# Patient Record
Sex: Male | Born: 1950
Health system: Southern US, Community
[De-identification: ages and names within clinical notes are randomized; demographics above are authoritative.]

## PROBLEM LIST (undated history)

## (undated) DIAGNOSIS — Z9889 Other specified postprocedural states: Secondary | ICD-10-CM

## (undated) DIAGNOSIS — M1990 Unspecified osteoarthritis, unspecified site: Secondary | ICD-10-CM

## (undated) DIAGNOSIS — K219 Gastro-esophageal reflux disease without esophagitis: Secondary | ICD-10-CM

## (undated) DIAGNOSIS — I493 Ventricular premature depolarization: Secondary | ICD-10-CM

## (undated) DIAGNOSIS — I10 Essential (primary) hypertension: Secondary | ICD-10-CM

## (undated) DIAGNOSIS — G629 Polyneuropathy, unspecified: Secondary | ICD-10-CM

## (undated) DIAGNOSIS — T7840XA Allergy, unspecified, initial encounter: Secondary | ICD-10-CM

## (undated) DIAGNOSIS — E785 Hyperlipidemia, unspecified: Secondary | ICD-10-CM

## (undated) DIAGNOSIS — R112 Nausea with vomiting, unspecified: Secondary | ICD-10-CM

## (undated) HISTORY — PX: SKIN BIOPSY: SHX1

## (undated) HISTORY — PX: KNEE SURGERY: SHX244

## (undated) HISTORY — DX: Hyperlipidemia, unspecified: E78.5

## (undated) HISTORY — DX: Gastro-esophageal reflux disease without esophagitis: K21.9

## (undated) HISTORY — DX: Polyneuropathy, unspecified: G62.9

## (undated) HISTORY — DX: Allergy, unspecified, initial encounter: T78.40XA

## (undated) HISTORY — PX: HAND SURGERY: SHX662

## (undated) HISTORY — PX: COLONOSCOPY: SHX174

## (undated) HISTORY — DX: Unspecified osteoarthritis, unspecified site: M19.90

## (undated) HISTORY — PX: HERNIA REPAIR: SHX51

---

## 2000-11-04 ENCOUNTER — Ambulatory Visit (HOSPITAL_COMMUNITY): Admission: RE | Admit: 2000-11-04 | Discharge: 2000-11-04 | Payer: Self-pay | Admitting: Gastroenterology

## 2002-09-13 ENCOUNTER — Other Ambulatory Visit: Admission: RE | Admit: 2002-09-13 | Discharge: 2002-09-13 | Payer: Self-pay | Admitting: Dermatology

## 2009-10-16 ENCOUNTER — Encounter (INDEPENDENT_AMBULATORY_CARE_PROVIDER_SITE_OTHER): Payer: Self-pay | Admitting: *Deleted

## 2009-11-11 ENCOUNTER — Ambulatory Visit: Payer: Self-pay | Admitting: Gastroenterology

## 2009-11-11 DIAGNOSIS — K625 Hemorrhage of anus and rectum: Secondary | ICD-10-CM | POA: Insufficient documentation

## 2009-11-11 DIAGNOSIS — R12 Heartburn: Secondary | ICD-10-CM

## 2009-11-27 ENCOUNTER — Ambulatory Visit: Payer: Self-pay | Admitting: Gastroenterology

## 2010-05-21 ENCOUNTER — Encounter: Admission: RE | Admit: 2010-05-21 | Discharge: 2010-05-21 | Payer: Self-pay | Admitting: Family Medicine

## 2011-01-11 ENCOUNTER — Encounter: Payer: Self-pay | Admitting: Family Medicine

## 2011-12-04 ENCOUNTER — Other Ambulatory Visit: Payer: Self-pay | Admitting: Family Medicine

## 2011-12-04 ENCOUNTER — Ambulatory Visit
Admission: RE | Admit: 2011-12-04 | Discharge: 2011-12-04 | Disposition: A | Discharge status: Home / Self Care | Payer: BC Managed Care – PPO | Source: Ambulatory Visit | Attending: Family Medicine | Admitting: Family Medicine

## 2011-12-04 ENCOUNTER — Other Ambulatory Visit: Payer: Self-pay

## 2011-12-04 DIAGNOSIS — M79669 Pain in unspecified lower leg: Secondary | ICD-10-CM

## 2011-12-04 DIAGNOSIS — M25562 Pain in left knee: Secondary | ICD-10-CM

## 2012-02-08 ENCOUNTER — Other Ambulatory Visit: Payer: Self-pay | Admitting: Family Medicine

## 2012-02-08 DIAGNOSIS — M5410 Radiculopathy, site unspecified: Secondary | ICD-10-CM

## 2012-02-12 ENCOUNTER — Ambulatory Visit
Admission: RE | Admit: 2012-02-12 | Discharge: 2012-02-12 | Disposition: A | Discharge status: Home / Self Care | Payer: BC Managed Care – PPO | Source: Ambulatory Visit | Attending: Family Medicine | Admitting: Family Medicine

## 2012-02-12 DIAGNOSIS — M5410 Radiculopathy, site unspecified: Secondary | ICD-10-CM

## 2012-07-13 ENCOUNTER — Other Ambulatory Visit: Payer: Self-pay | Admitting: Family Medicine

## 2012-07-13 DIAGNOSIS — D1803 Hemangioma of intra-abdominal structures: Secondary | ICD-10-CM

## 2012-07-13 DIAGNOSIS — N5089 Other specified disorders of the male genital organs: Secondary | ICD-10-CM

## 2012-07-20 ENCOUNTER — Other Ambulatory Visit: Payer: Self-pay

## 2012-07-20 ENCOUNTER — Ambulatory Visit
Admission: RE | Admit: 2012-07-20 | Discharge: 2012-07-20 | Disposition: A | Discharge status: Home / Self Care | Payer: BC Managed Care – PPO | Source: Ambulatory Visit | Attending: Family Medicine | Admitting: Family Medicine

## 2012-07-20 DIAGNOSIS — N5089 Other specified disorders of the male genital organs: Secondary | ICD-10-CM

## 2012-07-20 DIAGNOSIS — D1803 Hemangioma of intra-abdominal structures: Secondary | ICD-10-CM

## 2013-07-03 ENCOUNTER — Telehealth: Payer: Self-pay | Admitting: Family Medicine

## 2013-07-04 NOTE — Telephone Encounter (Signed)
Patient is having difficulty hearing.  He went to Urgent Care last night and was told to take a decongestant which is what I was going to suggest. Dr. Christell Constant is going to contact the patient today as well.

## 2013-07-05 ENCOUNTER — Telehealth: Payer: Self-pay | Admitting: Family Medicine

## 2013-11-08 DIAGNOSIS — Z23 Encounter for immunization: Secondary | ICD-10-CM

## 2013-11-20 ENCOUNTER — Other Ambulatory Visit (INDEPENDENT_AMBULATORY_CARE_PROVIDER_SITE_OTHER): Payer: BC Managed Care – PPO | Admitting: *Deleted

## 2013-11-20 DIAGNOSIS — Z23 Encounter for immunization: Secondary | ICD-10-CM

## 2014-02-01 ENCOUNTER — Encounter: Payer: Self-pay | Admitting: Family Medicine

## 2014-02-01 ENCOUNTER — Ambulatory Visit (INDEPENDENT_AMBULATORY_CARE_PROVIDER_SITE_OTHER): Payer: BC Managed Care – PPO | Admitting: Family Medicine

## 2014-02-01 ENCOUNTER — Encounter (INDEPENDENT_AMBULATORY_CARE_PROVIDER_SITE_OTHER): Payer: Self-pay

## 2014-02-01 VITALS — BP 120/70 | HR 58 | Temp 97.1°F | Ht 74.0 in | Wt 234.0 lb

## 2014-02-01 DIAGNOSIS — N4340 Spermatocele of epididymis, unspecified: Secondary | ICD-10-CM | POA: Insufficient documentation

## 2014-02-01 DIAGNOSIS — K8020 Calculus of gallbladder without cholecystitis without obstruction: Secondary | ICD-10-CM | POA: Insufficient documentation

## 2014-02-01 DIAGNOSIS — M4802 Spinal stenosis, cervical region: Secondary | ICD-10-CM | POA: Insufficient documentation

## 2014-02-01 DIAGNOSIS — R12 Heartburn: Secondary | ICD-10-CM

## 2014-02-01 DIAGNOSIS — N433 Hydrocele, unspecified: Secondary | ICD-10-CM | POA: Insufficient documentation

## 2014-02-01 DIAGNOSIS — M5090 Cervical disc disorder, unspecified, unspecified cervical region: Secondary | ICD-10-CM | POA: Insufficient documentation

## 2014-02-01 DIAGNOSIS — E785 Hyperlipidemia, unspecified: Secondary | ICD-10-CM | POA: Insufficient documentation

## 2014-02-01 DIAGNOSIS — B351 Tinea unguium: Secondary | ICD-10-CM

## 2014-02-01 DIAGNOSIS — K429 Umbilical hernia without obstruction or gangrene: Secondary | ICD-10-CM | POA: Insufficient documentation

## 2014-02-01 DIAGNOSIS — Z0000 Encounter for general adult medical examination without abnormal findings: Secondary | ICD-10-CM

## 2014-02-01 LAB — POCT URINALYSIS DIPSTICK
Bilirubin, UA: NEGATIVE
Glucose, UA: NEGATIVE
KETONES UA: NEGATIVE
Leukocytes, UA: NEGATIVE
Nitrite, UA: NEGATIVE
PH UA: 7
Protein, UA: NEGATIVE
RBC UA: NEGATIVE
SPEC GRAV UA: 1.01
Urobilinogen, UA: NEGATIVE

## 2014-02-01 LAB — POCT UA - MICROSCOPIC ONLY
BACTERIA, U MICROSCOPIC: NEGATIVE
CASTS, UR, LPF, POC: NEGATIVE
Crystals, Ur, HPF, POC: NEGATIVE
Epithelial cells, urine per micros: NEGATIVE
MUCUS UA: NEGATIVE
RBC, urine, microscopic: NEGATIVE
WBC, Ur, HPF, POC: NEGATIVE
Yeast, UA: NEGATIVE

## 2014-02-01 LAB — POCT CBC
Granulocyte percent: 64.4 %G (ref 37–80)
HCT, POC: 43.2 % — AB (ref 43.5–53.7)
Hemoglobin: 14.4 g/dL (ref 14.1–18.1)
LYMPH, POC: 1.1 (ref 0.6–3.4)
MCH: 30.1 pg (ref 27–31.2)
MCHC: 33.2 g/dL (ref 31.8–35.4)
MCV: 90.7 fL (ref 80–97)
MPV: 6.8 fL (ref 0–99.8)
PLATELET COUNT, POC: 177 10*3/uL (ref 142–424)
POC GRANULOCYTE: 2.4 (ref 2–6.9)
POC LYMPH PERCENT: 30.7 %L (ref 10–50)
RBC: 4.8 M/uL (ref 4.69–6.13)
RDW, POC: 12.1 %
WBC: 3.7 10*3/uL — AB (ref 4.6–10.2)

## 2014-02-01 NOTE — Addendum Note (Signed)
Addended by: Zannie Cove on: 02/01/2014 05:12 PM   Modules accepted: Orders

## 2014-02-01 NOTE — Progress Notes (Signed)
Subjective:    Patient ID: Marcus Jensen, male    DOB: 12-03-1951, 63 y.o.   MRN: [id number]  HPI Pt here for follow up and management of chronic medical problems. The patient is here for an annual exam. He FOBT to return. He will have lab work done. He understands that he is to check with his insurance regarding the Prevnar vaccine and a Zostavax. The patient is due for an eye exam and a stress test. His chest x-ray is not needed at this time.        Patient Active Problem List   Diagnosis Date Noted  . Hyperlipemia 02/01/2014  . HEMORRHAGE OF RECTUM AND ANUS 11/11/2009  . HEARTBURN 11/11/2009   Outpatient Encounter Prescriptions as of 02/01/2014  Medication Sig  . b complex vitamins capsule Take 1 capsule by mouth daily.  . cholecalciferol (VITAMIN D) 1000 UNITS tablet Take 2,000 Units by mouth daily.  Marland Kitchen gabapentin (NEURONTIN) 400 MG capsule Take 800 mg by mouth 2 (two) times daily.  Marland Kitchen glucosamine-chondroitin 500-400 MG tablet Take 1 tablet by mouth 3 (three) times daily.  . Omega-3 Fatty Acids (FISH OIL) 1000 MG CAPS Take 2 capsules by mouth daily.    Review of Systems  Constitutional: Negative.   HENT: Negative.   Eyes: Negative.   Respiratory: Negative.   Cardiovascular: Negative.   Gastrointestinal: Negative.   Endocrine: Negative.   Genitourinary: Negative.   Musculoskeletal: Positive for arthralgias (shoulder pains and carpel tunnel issues), myalgias (weakness in right arm) and neck pain (tolerable).  Skin: Negative.   Allergic/Immunologic: Negative.   Neurological: Negative.   Hematological: Negative.   Psychiatric/Behavioral: Negative.        Objective:   Physical Exam  Nursing note and vitals reviewed. Constitutional: He is oriented to person, place, and time. He appears well-developed and well-nourished. No distress.  HENT:  Head: Normocephalic and atraumatic.  Right Ear: External ear normal.  Left Ear: External ear normal.  Nose: Nose normal.    Mouth/Throat: Oropharynx is clear and moist. No oropharyngeal exudate.  Eyes: Conjunctivae and EOM are normal. Pupils are equal, round, and reactive to light. Right eye exhibits no discharge. Left eye exhibits no discharge. No scleral icterus.  Neck: Normal range of motion. Neck supple. No tracheal deviation present. No thyromegaly present.  No carotid bruits  Cardiovascular: Normal rate, regular rhythm, normal heart sounds and intact distal pulses.  Exam reveals no gallop and no friction rub.   No murmur heard. At 72 per minute  Pulmonary/Chest: Effort normal and breath sounds normal. No respiratory distress. He has no wheezes. He has no rales. He exhibits no tenderness.  No chest wall abnormalities or axillary adenopathy  Abdominal: Soft. Bowel sounds are normal. He exhibits no mass. There is no tenderness. There is no rebound and no guarding.  There is an umbilical hernia which was not tender.  Genitourinary: Rectum normal, prostate normal and penis normal.  The patient has a slightly enlarged prostate right greater than left he has a left spermatocele and a right hydrocele, there were no rectal masses. There were no inguinal hernias palpated. The external genitalia were otherwise normal. He has a history of having had scrotal ultrasound. And the above findings were noted from that.  Musculoskeletal: Normal range of motion. He exhibits no edema and no tenderness.  Lymphadenopathy:    He has no cervical adenopathy.  Neurological: He is alert and oriented to person, place, and time. He has normal reflexes. No cranial nerve  deficit.  Skin: Skin is warm and dry. No rash noted. No erythema. No pallor.  Tinea unguium present on toenails  Psychiatric: He has a normal mood and affect. His behavior is normal. Judgment and thought content normal.   BP 120/70  Pulse 58  Temp(Src) 97.1 F (36.2 C) (Oral)  Ht _0  (1.88 m)  Wt 234 lb (106.142 kg)  BMI 30.03 kg/m2        Assessment & Plan:    1. Hyperlipemia - NMR, lipoprofile  2. Heartburn  3. Annual physical exam - POCT CBC - BMP8+EGFR - Hepatic function panel - PSA, total and free - POCT UA - Microscopic Only - POCT urinalysis dipstick - Vit D  25 hydroxy (rtn osteoporosis monitoring) - Thyroid Panel With TSH  4. Cholelithiasis  5. Cervical disc disease  6. Spinal stenosis of cervical region  7. Spermatocele  8. Hydrocele, right  9. Umbilical hernia  10. Tinea unguium Patient Instructions  Continue current medications. Continue good therapeutic lifestyle changes which include good diet and exercise. Fall precautions discussed with patient. Schedule your flu vaccine if you haven't had it yet If you are over 98 years old - you may need Prevnar 41 or the adult Pneumonia vaccine. Please return the FOBT Please get an eye exam We will call you with the results of the lab work once this lab work is available. We will schedule you for a stress test with [facility] cardiology Please check with your insurance regarding the Prevnar and Zostavax    Arrie Senate MD

## 2014-02-01 NOTE — Patient Instructions (Addendum)
Continue current medications. Continue good therapeutic lifestyle changes which include good diet and exercise. Fall precautions discussed with patient. Schedule your flu vaccine if you haven't had it yet If you are over 61 years old - you may need Prevnar 85 or the adult Pneumonia vaccine. Please return the FOBT Please get an eye exam We will call you with the results of the lab work once this lab work is available. We will schedule you for a stress test with [facility] cardiology Please check with your insurance regarding the Prevnar and Zostavax

## 2014-02-02 LAB — NMR, LIPOPROFILE
Cholesterol: 238 mg/dL — ABNORMAL HIGH (ref <200)
HDL Cholesterol by NMR: 55 mg/dL (ref >=40)
HDL PARTICLE NUMBER: 29.6 umol/L — AB (ref >=30.5)
LDL Particle Number: 1820 nmol/L — ABNORMAL HIGH (ref <1000)
LDL Size: 21.1 nm (ref >20.5)
LDLC SERPL CALC-MCNC: 164 mg/dL — AB (ref <100)
LP-IR Score: 35 (ref <=45)
Small LDL Particle Number: 802 nmol/L — ABNORMAL HIGH (ref <=527)
TRIGLYCERIDES BY NMR: 97 mg/dL (ref <150)

## 2014-02-02 LAB — THYROID PANEL WITH TSH
Free Thyroxine Index: 2.3 (ref 1.2–4.9)
T3 UPTAKE RATIO: 28 % (ref 24–39)
T4 TOTAL: 8.1 ug/dL (ref 4.5–12.0)
TSH: 0.707 u[IU]/mL (ref 0.450–4.500)

## 2014-02-02 LAB — HEPATIC FUNCTION PANEL
ALT: 21 IU/L (ref 0–44)
AST: 25 IU/L (ref 0–40)
Albumin: 4.9 g/dL — ABNORMAL HIGH (ref 3.6–4.8)
Alkaline Phosphatase: 69 IU/L (ref 39–117)
BILIRUBIN DIRECT: 0.18 mg/dL (ref 0.00–0.40)
BILIRUBIN TOTAL: 0.7 mg/dL (ref 0.0–1.2)
TOTAL PROTEIN: 7.1 g/dL (ref 6.0–8.5)

## 2014-02-02 LAB — BMP8+EGFR
BUN / CREAT RATIO: 17 (ref 10–22)
BUN: 18 mg/dL (ref 8–27)
CHLORIDE: 98 mmol/L (ref 97–108)
CO2: 28 mmol/L (ref 18–29)
Calcium: 9.5 mg/dL (ref 8.6–10.2)
Creatinine, Ser: 1.05 mg/dL (ref 0.76–1.27)
GFR calc Af Amer: 89 mL/min/{1.73_m2} (ref >59)
GFR, EST NON AFRICAN AMERICAN: 77 mL/min/{1.73_m2} (ref >59)
GLUCOSE: 88 mg/dL (ref 65–99)
POTASSIUM: 4.1 mmol/L (ref 3.5–5.2)
SODIUM: 140 mmol/L (ref 134–144)

## 2014-02-02 LAB — PSA, TOTAL AND FREE
PSA FREE PCT: 28.3 %
PSA FREE: 0.51 ng/mL
PSA: 1.8 ng/mL (ref 0.0–4.0)

## 2014-02-02 LAB — VITAMIN D 25 HYDROXY (VIT D DEFICIENCY, FRACTURES): VIT D 25 HYDROXY: 45.4 ng/mL (ref 30.0–100.0)

## 2014-02-08 ENCOUNTER — Other Ambulatory Visit: Payer: BC Managed Care – PPO

## 2014-02-08 DIAGNOSIS — Z1212 Encounter for screening for malignant neoplasm of rectum: Secondary | ICD-10-CM

## 2014-02-08 NOTE — Progress Notes (Signed)
Pt came in for labs only 

## 2014-02-09 LAB — FECAL OCCULT BLOOD, IMMUNOCHEMICAL: FECAL OCCULT BLD: NEGATIVE

## 2014-02-21 ENCOUNTER — Encounter: Payer: Self-pay | Admitting: *Deleted

## 2014-02-21 ENCOUNTER — Ambulatory Visit (INDEPENDENT_AMBULATORY_CARE_PROVIDER_SITE_OTHER): Payer: BC Managed Care – PPO | Admitting: Physician Assistant

## 2014-02-21 DIAGNOSIS — E785 Hyperlipidemia, unspecified: Secondary | ICD-10-CM

## 2014-02-21 DIAGNOSIS — Z0000 Encounter for general adult medical examination without abnormal findings: Secondary | ICD-10-CM

## 2014-02-21 NOTE — Progress Notes (Signed)
Exercise Treadmill Test  Pre-Exercise Testing Evaluation Rhythm: normal sinus  Rate: 68     Test  Exercise Tolerance Test Ordering MD: Loralie Champagne, MD  Interpreting [id number] Bonnell Public, PA-C  Unique Test No: 1  Treadmill:  1  Indication for ETT: annual physical exam, hyperlipidemia  Contraindication to ETT: No   Stress Modality: exercise - treadmill  Cardiac Imaging Performed: non   Protocol: standard Bruce - maximal  Max BP:  207/78  Max MPHR (bpm):  160 85% MPR (bpm):  136  MPHR obtained (bpm):  166 % MPHR obtained:  104  Reached 85% MPHR (min:sec):  7:17 Total Exercise Time (min-sec):  9:14  Workload in METS:  11.3 Borg Scale: 17  Reason ETT Terminated: fatigue    ST Segment Analysis At Rest: normal ST segments - no evidence of significant ST depression With Exercise: no evidence of significant ST depression  Other Information Arrhythmia:  Yes: occasional PAC Angina during ETT:  absent (0) Quality of ETT:  diagnostic  ETT Interpretation:  normal - no evidence of ischemia by ST analysis  Comments: Good exercise tolerance. No Chest pain or EKG changes.  Occasional PAC's and hypertensive response to exercise. Reviewed with Dr. Julianne Handler who concurs Normal GXT Recommendations: F/u Dr. Laurance Flatten.

## 2014-02-21 NOTE — Progress Notes (Signed)
Quick Note:  Copy of labs sent to patient ______ 

## 2014-03-05 ENCOUNTER — Encounter: Payer: Self-pay | Admitting: *Deleted

## 2014-05-11 ENCOUNTER — Other Ambulatory Visit: Payer: Self-pay | Admitting: *Deleted

## 2014-05-11 MED ORDER — ROSUVASTATIN CALCIUM 20 MG PO TABS: ORAL_TABLET | ORAL | Status: DC

## 2014-05-26 ENCOUNTER — Emergency Department (HOSPITAL_COMMUNITY)
Admission: EM | Admit: 2014-05-26 | Discharge: 2014-05-26 | Disposition: A | Discharge status: Home / Self Care | Payer: Worker's Compensation | Attending: Emergency Medicine | Admitting: Emergency Medicine

## 2014-05-26 ENCOUNTER — Encounter (HOSPITAL_COMMUNITY): Payer: Self-pay | Admitting: Emergency Medicine

## 2014-05-26 DIAGNOSIS — Z79899 Other long term (current) drug therapy: Secondary | ICD-10-CM | POA: Insufficient documentation

## 2014-05-26 DIAGNOSIS — Z87891 Personal history of nicotine dependence: Secondary | ICD-10-CM | POA: Insufficient documentation

## 2014-05-26 DIAGNOSIS — Z23 Encounter for immunization: Secondary | ICD-10-CM | POA: Insufficient documentation

## 2014-05-26 DIAGNOSIS — G609 Hereditary and idiopathic neuropathy, unspecified: Secondary | ICD-10-CM | POA: Insufficient documentation

## 2014-05-26 DIAGNOSIS — E785 Hyperlipidemia, unspecified: Secondary | ICD-10-CM | POA: Insufficient documentation

## 2014-05-26 MED ORDER — RABIES VACCINE, PCEC IM SUSR
1.0000 mL | Freq: Once | INTRAMUSCULAR | Status: AC
  Administered 2014-05-26: 1 mL via INTRAMUSCULAR
  Filled 2014-05-26: qty 1

## 2014-05-26 MED ORDER — RABIES IMMUNE GLOBULIN 150 UNIT/ML IM INJ
20.0000 [IU]/kg | INJECTION | Freq: Once | INTRAMUSCULAR | Status: AC
Start: 2014-05-26 — End: 2014-05-26
  Administered 2014-05-26: 2025 [IU] via INTRAMUSCULAR
  Filled 2014-05-26: qty 13.5

## 2014-05-26 NOTE — ED Provider Notes (Signed)
Medical screening examination/treatment/procedure(s) were performed by non-physician practitioner and as supervising physician I was immediately available for consultation/collaboration.  Richarda Blade, MD 05/26/14 [phone]

## 2014-05-26 NOTE — ED Provider Notes (Signed)
CSN: [id number]     Arrival date & time 05/26/14  2057 History   First MD Initiated Contact with Patient 05/26/14 2107     No chief complaint on file.    (Consider location/radiation/quality/duration/timing/severity/associated sxs/prior Treatment) The history is provided by the patient.   Marcus Jensen is a 63 y.o. male who presents to the ED for rabies vaccine after being exposed to a rabid cat yesterday. 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 He had to decapitate the the kitten and send the head for evaluation for possible rabies. This morning he got a call telling him the kitten had tested positive. The patient has not had his vaccine since 1970. He was wearing gloves but had been handling the cage and is not sure if when the kitten was hissing he may have gotten saliva on him.     Past Medical History  Diagnosis Date  . Hyperlipidemia   . Peripheral neuropathy    Past Surgical History  Procedure Laterality Date  . Knee surgery Bilateral   . Hand surgery Left    Family History  Problem Relation Age of Onset  . CVA Mother   . CVA Father   . Cancer Brother     liver   History  Substance Use Topics  . Smoking status: Former Smoker    Quit date: 02/01/1990  . Smokeless tobacco: Not on file  . Alcohol Use: No    Review of Systems Negative except as stated in HPI   Allergies  Review of patient's allergies indicates no known allergies.  Home Medications   Prior to Admission medications   Medication Sig Start Date End Date Taking? Authorizing Provider  b complex vitamins capsule Take 1 capsule by mouth daily.    Historical Provider, MD  cholecalciferol (VITAMIN D) 1000 UNITS tablet Take 2,000 Units by mouth daily.    Historical Provider, MD  gabapentin (NEURONTIN) 400 MG capsule Take 800 mg by mouth 2 (two) times daily.    Historical Provider, MD  glucosamine-chondroitin 500-400 MG tablet Take 1 tablet by mouth 3  (three) times daily.    Historical Provider, MD  Omega-3 Fatty Acids (FISH OIL) 1000 MG CAPS Take 2 capsules by mouth daily.    Historical Provider, MD  rosuvastatin (CRESTOR) 20 MG tablet Take one tablet by mouth daily as directed 05/11/14   Chipper Herb, MD   There were no vitals taken for this visit. Physical Exam  Nursing note and vitals reviewed. Constitutional: He is oriented to person, place, and time. He appears well-developed and well-nourished.  HENT:  Head: Normocephalic.  Eyes: EOM are normal.  Neck: Neck supple.  Cardiovascular: Normal rate.   Pulmonary/Chest: Effort normal.  Musculoskeletal: Normal range of motion.  Neurological: He is alert and oriented to person, place, and time. No cranial nerve deficit.  Skin: Skin is warm and dry.  Psychiatric: He has a normal mood and affect. His behavior is normal.    ED Course  Procedures   MDM  63 y.o. male with exposure to rabid kitten yesterday. Will start rabies vaccine. He will continue the series as scheduled. Discussed with the patient and all questioned fully answered. He will return if any problems arise. Stable for discharge without any problems at this time.     [place], [place] 05/26/14 2146

## 2014-05-26 NOTE — ED Notes (Signed)
Pt exposed to rabid animal yesterday.

## 2014-05-29 ENCOUNTER — Encounter (HOSPITAL_COMMUNITY)
Admission: RE | Admit: 2014-05-29 | Discharge: 2014-05-29 | Disposition: A | Discharge status: Home / Self Care | Payer: Worker's Compensation | Source: Ambulatory Visit | Attending: Family Medicine | Admitting: Family Medicine

## 2014-05-29 DIAGNOSIS — Z203 Contact with and (suspected) exposure to rabies: Secondary | ICD-10-CM | POA: Insufficient documentation

## 2014-05-29 MED ORDER — RABIES VACCINE, PCEC IM SUSR
INTRAMUSCULAR | Status: AC
  Filled 2014-05-29: qty 1

## 2014-05-29 MED ORDER — RABIES VACCINE, PCEC IM SUSR
1.0000 mL | Freq: Once | INTRAMUSCULAR | Status: AC
  Administered 2014-05-29: 1 mL via INTRAMUSCULAR

## 2014-06-04 ENCOUNTER — Encounter (HOSPITAL_COMMUNITY)
Admission: RE | Admit: 2014-06-04 | Discharge: 2014-06-04 | Disposition: A | Discharge status: Home / Self Care | Payer: Worker's Compensation | Source: Ambulatory Visit | Attending: Family Medicine | Admitting: Family Medicine

## 2014-06-04 MED ORDER — RABIES VACCINE, PCEC IM SUSR
INTRAMUSCULAR | Status: AC
  Filled 2014-06-04: qty 1

## 2014-06-04 MED ORDER — RABIES VACCINE, PCEC IM SUSR
1.0000 mL | Freq: Once | INTRAMUSCULAR | Status: AC
  Administered 2014-06-04: 1 mL via INTRAMUSCULAR

## 2014-06-05 ENCOUNTER — Encounter: Payer: Self-pay | Admitting: Family Medicine

## 2014-06-05 ENCOUNTER — Ambulatory Visit (INDEPENDENT_AMBULATORY_CARE_PROVIDER_SITE_OTHER): Payer: BC Managed Care – PPO | Admitting: Family Medicine

## 2014-06-05 VITALS — BP 122/78 | HR 68 | Temp 97.8°F | Ht 73.0 in | Wt 234.0 lb

## 2014-06-05 DIAGNOSIS — E785 Hyperlipidemia, unspecified: Secondary | ICD-10-CM

## 2014-06-05 DIAGNOSIS — R12 Heartburn: Secondary | ICD-10-CM

## 2014-06-05 DIAGNOSIS — E559 Vitamin D deficiency, unspecified: Secondary | ICD-10-CM

## 2014-06-05 LAB — POCT CBC
Granulocyte percent: 72.5 %G (ref 37–80)
HCT, POC: 43.8 % (ref 43.5–53.7)
HEMOGLOBIN: 14.1 g/dL (ref 14.1–18.1)
LYMPH, POC: 0.9 (ref 0.6–3.4)
MCH: 28.9 pg (ref 27–31.2)
MCHC: 32.2 g/dL (ref 31.8–35.4)
MCV: 89.7 fL (ref 80–97)
MPV: 6.7 fL (ref 0–99.8)
PLATELET COUNT, POC: 178 10*3/uL (ref 142–424)
POC Granulocyte: 3.6 (ref 2–6.9)
POC LYMPH PERCENT: 19.2 %L (ref 10–50)
RBC: 4.9 M/uL (ref 4.69–6.13)
RDW, POC: 12.1 %
WBC: 4.9 10*3/uL (ref 4.6–10.2)

## 2014-06-05 MED ORDER — ROSUVASTATIN CALCIUM 20 MG PO TABS: ORAL_TABLET | ORAL | Status: DC

## 2014-06-05 NOTE — Patient Instructions (Addendum)
Continue current medications. Continue good therapeutic lifestyle changes which include good diet and exercise. Fall precautions discussed with patient. If an FOBT was given today- please return it to our front desk. If you are over 61 years old - you may need Prevnar 6 or the adult Pneumonia vaccine.  Continue the Crestor and as much exercise as possible. Continue drinking plenty of water We will plan to get a chest x-ray at the next visit. The patient should check with his insurance regarding the Prevnar vaccine which is due at age 60 and a shingle shot which is due at age 65.------ he should let us know about this as soon as she finds out.

## 2014-06-05 NOTE — Progress Notes (Signed)
Subjective:    Patient ID: Marcus Jensen, male    DOB: 1950/12/23, 63 y.o.   MRN: [id number]  HPI Pt here for follow up and management of chronic medical problems. The patient had a recent exposure to a kitten with rabies and is currently receiving vaccine shots for this at the hospital. He is doing well otherwise with no particular complaints. He's been taking his Crestor without problems. His family history was reviewed briefly and that his father died of a stroke his mother is elderly but no particular health problems and he had a brother that may have died from liver cancer. He has an older brother that is 100 and is in good health. The patient was given a coupon for his Crestor today.       Patient Active Problem List   Diagnosis Date Noted  . Hyperlipemia 02/01/2014  . Cholelithiasis 02/01/2014  . Cervical disc disease 02/01/2014  . Spinal stenosis of cervical region 02/01/2014  . Spermatocele 02/01/2014  . Hydrocele, right 02/01/2014  . Umbilical hernia 10/14/8526  . HEMORRHAGE OF RECTUM AND ANUS 11/11/2009  . HEARTBURN 11/11/2009   Outpatient Encounter Prescriptions as of 06/05/2014  Medication Sig  . b complex vitamins capsule Take 1 capsule by mouth daily.  . cholecalciferol (VITAMIN D) 1000 UNITS tablet Take 2,000 Units by mouth daily.  . Cyanocobalamin (VITAMIN B 12 PO) Take 1 tablet by mouth daily.  Marland Kitchen gabapentin (NEURONTIN) 400 MG capsule Take 800 mg by mouth 2 (two) times daily.  Marland Kitchen glucosamine-chondroitin 500-400 MG tablet Take 1 tablet by mouth 3 (three) times daily.  . Omega-3 Fatty Acids (FISH OIL) 1000 MG CAPS Take 2 capsules by mouth daily.  . rosuvastatin (CRESTOR) 20 MG tablet Take one tablet by mouth daily as directed    Review of Systems  Constitutional: Negative.   HENT: Negative.   Eyes: Negative.   Respiratory: Negative.   Cardiovascular: Negative.   Gastrointestinal: Negative.   Endocrine: Negative.   Genitourinary: Negative.   Musculoskeletal:  Negative.   Skin: Negative.   Allergic/Immunologic: Negative.   Neurological: Negative.   Hematological: Negative.   Psychiatric/Behavioral: Negative.        Objective:   Physical Exam  Nursing note and vitals reviewed. Constitutional: He is oriented to person, place, and time. He appears well-developed and well-nourished. No distress.  HENT:  Head: Normocephalic and atraumatic.  Right Ear: External ear normal.  Left Ear: External ear normal.  Nose: Nose normal.  Mouth/Throat: Oropharynx is clear and moist. No oropharyngeal exudate.  Eyes: Conjunctivae and EOM are normal. Pupils are equal, round, and reactive to light. Right eye exhibits no discharge. Left eye exhibits no discharge. No scleral icterus.  Neck: Normal range of motion. Neck supple. No thyromegaly present.  Cardiovascular: Normal rate, regular rhythm and normal heart sounds.  Exam reveals no friction rub.   No murmur heard. At 72 per minute  Pulmonary/Chest: Effort normal and breath sounds normal. No respiratory distress. He has no wheezes. He has no rales. He exhibits no tenderness.  Abdominal: Soft. Bowel sounds are normal. He exhibits no mass. There is no tenderness. There is no rebound and no guarding.  Musculoskeletal: Normal range of motion. He exhibits no edema and no tenderness.  Lymphadenopathy:    He has no cervical adenopathy.  Neurological: He is alert and oriented to person, place, and time. No cranial nerve deficit.  Skin: Skin is warm and dry. No rash noted. No erythema. No pallor.  Psychiatric: He  has a normal mood and affect. His behavior is normal. Judgment and thought content normal.   BP 122/78  Pulse 68  Temp(Src) 97.8 F (36.6 C) (Oral)  Ht _0  (1.854 m)  Wt 234 lb (106.142 kg)  BMI 30.88 kg/m2        Assessment & Plan:  1. Hyperlipemia - POCT CBC - BMP8+EGFR - Hepatic function panel - NMR, lipoprofile  2. Heartburn - POCT CBC  3. Vitamin D deficiency - Vit D  25 hydroxy  (rtn osteoporosis monitoring)  Patient Instructions  Continue current medications. Continue good therapeutic lifestyle changes which include good diet and exercise. Fall precautions discussed with patient. If an FOBT was given today- please return it to our front desk. If you are over 78 years old - you may need Prevnar 80 or the adult Pneumonia vaccine.  Continue the Crestor and as much exercise as possible. Continue drinking plenty of water We will plan to get a chest x-ray at the next visit. The patient should check with his insurance regarding the Prevnar vaccine which is due at age 77 and a shingle shot which is due at age 70.------ he should let us know about this as soon as she finds out.   Arrie Senate MD

## 2014-06-06 ENCOUNTER — Telehealth: Payer: Self-pay | Admitting: Family Medicine

## 2014-06-06 LAB — BMP8+EGFR
BUN / CREAT RATIO: 13 (ref 10–22)
BUN: 14 mg/dL (ref 8–27)
CALCIUM: 10.2 mg/dL (ref 8.6–10.2)
CHLORIDE: 98 mmol/L (ref 97–108)
CO2: 27 mmol/L (ref 18–29)
CREATININE: 1.12 mg/dL (ref 0.76–1.27)
GFR calc non Af Amer: 71 mL/min/{1.73_m2} (ref >59)
GFR, EST AFRICAN AMERICAN: 82 mL/min/{1.73_m2} (ref >59)
Glucose: 88 mg/dL (ref 65–99)
POTASSIUM: 4.8 mmol/L (ref 3.5–5.2)
SODIUM: 139 mmol/L (ref 134–144)

## 2014-06-06 LAB — NMR, LIPOPROFILE
Cholesterol: 163 mg/dL (ref 100–199)
HDL CHOLESTEROL BY NMR: 51 mg/dL (ref >39)
HDL Particle Number: 34.3 umol/L (ref >=30.5)
LDL PARTICLE NUMBER: 1232 nmol/L — AB (ref <1000)
LDL SIZE: 20.8 nm (ref >20.5)
LDLC SERPL CALC-MCNC: 91 mg/dL (ref 0–99)
LP-IR Score: 34 (ref <=45)
Small LDL Particle Number: 653 nmol/L — ABNORMAL HIGH (ref <=527)
Triglycerides by NMR: 107 mg/dL (ref 0–149)

## 2014-06-06 LAB — HEPATIC FUNCTION PANEL
ALBUMIN: 4.9 g/dL — AB (ref 3.6–4.8)
ALT: 30 IU/L (ref 0–44)
AST: 34 IU/L (ref 0–40)
Alkaline Phosphatase: 77 IU/L (ref 39–117)
BILIRUBIN TOTAL: 1.2 mg/dL (ref 0.0–1.2)
Bilirubin, Direct: 0.24 mg/dL (ref 0.00–0.40)
Total Protein: 6.8 g/dL (ref 6.0–8.5)

## 2014-06-06 LAB — VITAMIN D 25 HYDROXY (VIT D DEFICIENCY, FRACTURES): VIT D 25 HYDROXY: 43.1 ng/mL (ref 30.0–100.0)

## 2014-06-06 NOTE — Telephone Encounter (Signed)
Message copied by Cline Crock on Wed Jun 06, 2014  4:03 PM ------      Message from: Chipper Herb      Created: Wed Jun 06, 2014  9:35 AM       Blood sugar renal and electrolytes including potassium are within normal limits      1 liver function test remains very slightly elevated at 4.9. This is the albumin. This is what it was 4 months ago. All other liver function tests are normal.      With advanced lipid testing a total LDL particle number is 1232. The goal is to be less than 1000. 4 months ago was 1820. The LDL C. is good at 91. 4 months ago it was 164. The triglycerides are good.----Continue current treatment and as aggressive therapeutic lifestyle changes as possible which include diet and exercise we will keep striving to get the total LDL particle number less than 1000.------- recheck advanced lipid testing in 3-4 months      The vitamin D level was good at 43.1----continue current treatment       ------

## 2014-06-11 ENCOUNTER — Encounter (HOSPITAL_COMMUNITY)
Admission: RE | Admit: 2014-06-11 | Discharge: 2014-06-11 | Disposition: A | Discharge status: Home / Self Care | Payer: Worker's Compensation | Source: Ambulatory Visit | Attending: Family Medicine | Admitting: Family Medicine

## 2014-06-11 MED ORDER — RABIES VACCINE, PCEC IM SUSR
1.0000 mL | Freq: Once | INTRAMUSCULAR | Status: AC
  Administered 2014-06-11: 1 mL via INTRAMUSCULAR

## 2014-06-11 MED ORDER — RABIES VACCINE, PCEC IM SUSR
INTRAMUSCULAR | Status: AC
  Filled 2014-06-11: qty 1

## 2014-06-12 ENCOUNTER — Ambulatory Visit: Payer: BC Managed Care – PPO | Admitting: Family Medicine

## 2014-09-17 ENCOUNTER — Encounter: Payer: Self-pay | Admitting: Gastroenterology

## 2014-11-02 ENCOUNTER — Ambulatory Visit (INDEPENDENT_AMBULATORY_CARE_PROVIDER_SITE_OTHER): Payer: BC Managed Care – PPO | Admitting: *Deleted

## 2014-11-02 ENCOUNTER — Ambulatory Visit: Payer: BC Managed Care – PPO

## 2014-11-02 DIAGNOSIS — Z23 Encounter for immunization: Secondary | ICD-10-CM

## 2014-12-04 ENCOUNTER — Ambulatory Visit (INDEPENDENT_AMBULATORY_CARE_PROVIDER_SITE_OTHER): Payer: BC Managed Care – PPO

## 2014-12-04 ENCOUNTER — Encounter: Payer: Self-pay | Admitting: Family Medicine

## 2014-12-04 ENCOUNTER — Ambulatory Visit (INDEPENDENT_AMBULATORY_CARE_PROVIDER_SITE_OTHER): Payer: BC Managed Care – PPO | Admitting: Family Medicine

## 2014-12-04 VITALS — BP 126/79 | HR 62 | Temp 97.2°F | Ht 73.0 in | Wt 235.0 lb

## 2014-12-04 DIAGNOSIS — J301 Allergic rhinitis due to pollen: Secondary | ICD-10-CM

## 2014-12-04 DIAGNOSIS — M545 Low back pain: Secondary | ICD-10-CM

## 2014-12-04 DIAGNOSIS — E785 Hyperlipidemia, unspecified: Secondary | ICD-10-CM

## 2014-12-04 DIAGNOSIS — E559 Vitamin D deficiency, unspecified: Secondary | ICD-10-CM

## 2014-12-04 DIAGNOSIS — K429 Umbilical hernia without obstruction or gangrene: Secondary | ICD-10-CM

## 2014-12-04 DIAGNOSIS — M5442 Lumbago with sciatica, left side: Secondary | ICD-10-CM

## 2014-12-04 DIAGNOSIS — M5090 Cervical disc disorder, unspecified, unspecified cervical region: Secondary | ICD-10-CM

## 2014-12-04 DIAGNOSIS — I700 Atherosclerosis of aorta: Secondary | ICD-10-CM | POA: Insufficient documentation

## 2014-12-04 LAB — POCT CBC
Granulocyte percent: 64.3 %G (ref 37–80)
HCT, POC: 43.9 % (ref 43.5–53.7)
Hemoglobin: 14.8 g/dL (ref 14.1–18.1)
LYMPH, POC: 1.2 (ref 0.6–3.4)
MCH: 30.2 pg (ref 27–31.2)
MCHC: 33.7 g/dL (ref 31.8–35.4)
MCV: 89.4 fL (ref 80–97)
MPV: 7.2 fL (ref 0–99.8)
POC Granulocyte: 2.6 (ref 2–6.9)
POC LYMPH PERCENT: 28.6 %L (ref 10–50)
Platelet Count, POC: 165 10*3/uL (ref 142–424)
RBC: 4.9 M/uL (ref 4.69–6.13)
RDW, POC: 12.1 %
WBC: 4.1 10*3/uL — AB (ref 4.6–10.2)

## 2014-12-04 MED ORDER — DICLOFENAC SODIUM 75 MG PO TBEC: 75.0000 mg | DELAYED_RELEASE_TABLET | Freq: Two times a day (BID) | ORAL | Status: DC

## 2014-12-04 MED ORDER — FLUTICASONE PROPIONATE 50 MCG/ACT NA SUSP: 2.0000 | Freq: Every day | NASAL | Status: DC

## 2014-12-04 NOTE — Progress Notes (Signed)
 Subjective:    Patient ID: Marcus Jensen, male    DOB: 03/19/1951, 63 y.o.   MRN: [id number]  HPI Pt here for follow up and management of chronic medical problems.          Patient Active Problem List   Diagnosis Date Noted  . Hyperlipemia 02/01/2014  . Cholelithiasis 02/01/2014  . Cervical disc disease 02/01/2014  . Spinal stenosis of cervical region 02/01/2014  . Spermatocele 02/01/2014  . Hydrocele, right 02/01/2014  . Umbilical hernia 02/01/2014  . HEMORRHAGE OF RECTUM AND ANUS 11/11/2009  . HEARTBURN 11/11/2009   Outpatient Encounter Prescriptions as of 12/04/2014  Medication Sig  . b complex vitamins capsule Take 1 capsule by mouth daily.  . cholecalciferol (VITAMIN D) 1000 UNITS tablet Take 2,000 Units by mouth daily.  . Cyanocobalamin (VITAMIN B 12 PO) Take 1 tablet by mouth daily.  . gabapentin (NEURONTIN) 400 MG capsule Take 800 mg by mouth 2 (two) times daily.  . glucosamine-chondroitin 500-400 MG tablet Take 1 tablet by mouth 3 (three) times daily.  . Omega-3 Fatty Acids (FISH OIL) 1000 MG CAPS Take 2 capsules by mouth daily.  . rosuvastatin (CRESTOR) 20 MG tablet Take one tablet by mouth daily as directed    Review of Systems  Constitutional: Negative.   HENT: Positive for congestion (slight ).   Eyes: Negative.   Respiratory: Negative.   Cardiovascular: Negative.   Gastrointestinal: Negative.   Endocrine: Negative.   Genitourinary: Negative.   Musculoskeletal: Positive for back pain (low left side) and neck pain (left side).  Skin: Negative.   Allergic/Immunologic: Negative.   Neurological: Negative.   Hematological: Negative.   Psychiatric/Behavioral: Negative.        Objective:   Physical Exam  Constitutional: He is oriented to person, place, and time. He appears well-developed and well-nourished. No distress.  HENT:  Head: Normocephalic and atraumatic.  Right Ear: External ear normal.  Left Ear: External ear normal.  Mouth/Throat:  Oropharynx is clear and moist. No oropharyngeal exudate.  Nasal congestion and turbinate swelling bilaterally  Eyes: Conjunctivae and EOM are normal. Pupils are equal, round, and reactive to light. Right eye exhibits no discharge. Left eye exhibits no discharge. No scleral icterus.  Neck: Normal range of motion. Neck supple. No thyromegaly present.  No anterior cervical nodes or thyromegaly  Cardiovascular: Normal rate, regular rhythm and normal heart sounds.  Exam reveals no gallop and no friction rub.   No murmur heard. Pulmonary/Chest: Effort normal and breath sounds normal. No respiratory distress. He has no wheezes. He has no rales. He exhibits no tenderness.  Minimal congestion with coughing  Abdominal: Soft. Bowel sounds are normal. He exhibits no mass. There is no tenderness. There is no rebound and no guarding.  No inguinal adenopathy. Umbilical hernia present  Musculoskeletal: Normal range of motion. He exhibits no edema or tenderness.  Lymphadenopathy:    He has no cervical adenopathy.  Neurological: He is alert and oriented to person, place, and time. He has normal reflexes. No cranial nerve deficit.  Skin: Skin is warm and dry. No rash noted. No erythema. No pallor.  Psychiatric: He has a normal mood and affect. His behavior is normal. Judgment and thought content normal.  Nursing note and vitals reviewed.  BP 126/79 mmHg  Pulse 62  Temp(Src) 97.2 F (36.2 C) (Oral)  Ht 6' 1" (1.854 m)  Wt 235 lb (106.595 kg)  BMI 31.01 kg/m2  [facility] reading (PRIMARY) by  Dr. Moore-chest x-ray, LS   spine  --chest x-ray had no active disease. There is atherosclerosis of the thoracic aorta. The LS-spine had degenerative changes with GERD just spaces and some possible slippage of L4 on L5                                      Assessment & Plan:  1. Hyperlipemia - POCT CBC - BMP8+EGFR - Hepatic function panel - NMR, lipoprofile - Vit D  25 hydroxy (rtn osteoporosis monitoring) - DG Chest  2 View; Future  2. Vitamin D deficiency - POCT CBC - Vit D  25 hydroxy (rtn osteoporosis monitoring)  3. Left low back pain, with sciatica presence unspecified - DG Lumbar Spine 2-3 Views; Future  4. Allergic rhinitis due to pollen  5. Cervical disc disease  6. Umbilical hernia without obstruction and without gangrene  7. Left-sided low back pain with left-sided sciatica   Meds ordered this encounter  Medications  . diclofenac (VOLTAREN) 75 MG EC tablet    Sig: Take 1 tablet (75 mg total) by mouth 2 (two) times daily.    Dispense:  60 tablet    Refill:  1  . fluticasone (FLONASE) 50 MCG/ACT nasal spray    Sig: Place 2 sprays into both nostrils at bedtime.    Dispense:  16 g    Refill:  6   Patient Instructions                       Medicare Annual Wellness Visit  [facility] and the medical providers at [facility] strive to bring you the best medical care.  In doing so we not only want to address your current medical conditions and concerns but also to detect new conditions early and prevent illness, disease and health-related problems.    Medicare offers a yearly Wellness Visit which allows our clinical staff to assess your need for preventative services including immunizations, lifestyle education, counseling to decrease risk of preventable diseases and screening for fall risk and other medical concerns.    This visit is provided free of charge (no copay) for all Medicare recipients. The clinical pharmacists at [facility] have begun to conduct these Wellness Visits which will also include a thorough review of all your medications.    As you primary medical provider recommend that you make an appointment for your Annual Wellness Visit if you have not done so already this year.  You may set up this appointment before you leave today or you may call back [phone]) and schedule an appointment.  Please make sure when you call that  you mention that you are scheduling your Annual Wellness Visit with the clinical pharmacist so that the appointment may be made for the proper length of time.     Continue current medications. Continue good therapeutic lifestyle changes which include good diet and exercise. Fall precautions discussed with patient. If an FOBT was given today- please return it to our front desk. If you are over 58 years old - you may need Prevnar 34 or the adult Pneumonia vaccine.  Flu Shots will be available at our office starting mid- September. Please call and schedule a FLU CLINIC APPOINTMENT.   Use nasal spray 1-2 sprays each nostril at bedtime Take Claritin 1 daily Take voltaren 1 twice daily after breakfast and supper as needed for neck pain and low back pain  If this medication bothers her stomach discontinue it If the neck or back pain get worse please call us and we will arrange for you to see the neurosurgeon If the umbilical hernia pain gets worse please see the surgeon as soon as possible Do not forget to check about the Prevnar vaccine   Arrie Senate MD

## 2014-12-04 NOTE — Patient Instructions (Addendum)
Medicare Annual Wellness Visit  [facility] and the medical providers at [facility] strive to bring you the best medical care.  In doing so we not only want to address your current medical conditions and concerns but also to detect new conditions early and prevent illness, disease and health-related problems.    Medicare offers a yearly Wellness Visit which allows our clinical staff to assess your need for preventative services including immunizations, lifestyle education, counseling to decrease risk of preventable diseases and screening for fall risk and other medical concerns.    This visit is provided free of charge (no copay) for all Medicare recipients. The clinical pharmacists at [facility] have begun to conduct these Wellness Visits which will also include a thorough review of all your medications.    As you primary medical provider recommend that you make an appointment for your Annual Wellness Visit if you have not done so already this year.  You may set up this appointment before you leave today or you may call back ([phone]) and schedule an appointment.  Please make sure when you call that you mention that you are scheduling your Annual Wellness Visit with the clinical pharmacist so that the appointment may be made for the proper length of time.     Continue current medications. Continue good therapeutic lifestyle changes which include good diet and exercise. Fall precautions discussed with patient. If an FOBT was given today- please return it to our front desk. If you are over 29 years old - you may need Prevnar 35 or the adult Pneumonia vaccine.  Flu Shots will be available at our office starting mid- September. Please call and schedule a FLU CLINIC APPOINTMENT.   Use nasal spray 1-2 sprays each nostril at bedtime Take Claritin 1 daily Take voltaren 1 twice daily after breakfast and supper as needed for neck pain  and low back pain If this medication bothers her stomach discontinue it If the neck or back pain get worse please call us and we will arrange for you to see the neurosurgeon If the umbilical hernia pain gets worse please see the surgeon as soon as possible Do not forget to check about the Prevnar vaccine

## 2014-12-05 ENCOUNTER — Telehealth: Payer: Self-pay | Admitting: *Deleted

## 2014-12-05 ENCOUNTER — Telehealth: Payer: Self-pay

## 2014-12-05 LAB — NMR, LIPOPROFILE
Cholesterol: 175 mg/dL (ref 100–199)
HDL Cholesterol by NMR: 55 mg/dL (ref >39)
HDL Particle Number: 36.5 umol/L (ref >=30.5)
LDL Particle Number: 1187 nmol/L — ABNORMAL HIGH (ref <1000)
LDL SIZE: 21.4 nm (ref >20.5)
LDL-C: 108 mg/dL — ABNORMAL HIGH (ref 0–99)
LP-IR Score: 25 (ref <=45)
Small LDL Particle Number: 487 nmol/L (ref <=527)
Triglycerides by NMR: 59 mg/dL (ref 0–149)

## 2014-12-05 LAB — BMP8+EGFR
BUN / CREAT RATIO: 22 (ref 10–22)
BUN: 22 mg/dL (ref 8–27)
CO2: 23 mmol/L (ref 18–29)
CREATININE: 1.02 mg/dL (ref 0.76–1.27)
Calcium: 9.3 mg/dL (ref 8.6–10.2)
Chloride: 100 mmol/L (ref 97–108)
GFR calc Af Amer: 91 mL/min/{1.73_m2} (ref >59)
GFR, EST NON AFRICAN AMERICAN: 79 mL/min/{1.73_m2} (ref >59)
Glucose: 98 mg/dL (ref 65–99)
POTASSIUM: 4.3 mmol/L (ref 3.5–5.2)
SODIUM: 139 mmol/L (ref 134–144)

## 2014-12-05 LAB — HEPATIC FUNCTION PANEL
ALT: 35 IU/L (ref 0–44)
AST: 34 IU/L (ref 0–40)
Albumin: 4.5 g/dL (ref 3.6–4.8)
Alkaline Phosphatase: 75 IU/L (ref 39–117)
BILIRUBIN DIRECT: 0.19 mg/dL (ref 0.00–0.40)
Total Bilirubin: 0.8 mg/dL (ref 0.0–1.2)
Total Protein: 6.7 g/dL (ref 6.0–8.5)

## 2014-12-05 LAB — VITAMIN D 25 HYDROXY (VIT D DEFICIENCY, FRACTURES): VIT D 25 HYDROXY: 40.4 ng/mL (ref 30.0–100.0)

## 2014-12-05 NOTE — Telephone Encounter (Signed)
Aware of results. 

## 2014-12-05 NOTE — Telephone Encounter (Signed)
Pt aware of results and does not want MRI at this time

## 2014-12-05 NOTE — Telephone Encounter (Signed)
-----   Message from Chipper Herb, MD sent at 12/04/2014  5:36 PM EST ----- As per radiology report--- please tell patient if pain gets worse we may need to do an MRI and he should let us know that.

## 2014-12-05 NOTE — Telephone Encounter (Signed)
-----   Message from Chipper Herb, MD sent at 12/04/2014  5:37 PM EST ----- As per radiology report

## 2015-04-29 ENCOUNTER — Telehealth: Payer: Self-pay | Admitting: Family Medicine

## 2015-04-29 NOTE — Telephone Encounter (Signed)
Appointment given for wed @ 11:30

## 2015-04-30 ENCOUNTER — Encounter: Payer: Self-pay | Admitting: Family Medicine

## 2015-05-01 ENCOUNTER — Encounter: Payer: Self-pay | Admitting: Family Medicine

## 2015-05-01 ENCOUNTER — Ambulatory Visit (INDEPENDENT_AMBULATORY_CARE_PROVIDER_SITE_OTHER): Payer: BLUE CROSS/BLUE SHIELD | Admitting: Family Medicine

## 2015-05-01 ENCOUNTER — Ambulatory Visit (INDEPENDENT_AMBULATORY_CARE_PROVIDER_SITE_OTHER): Payer: BLUE CROSS/BLUE SHIELD

## 2015-05-01 VITALS — BP 126/76 | HR 64 | Temp 97.7°F | Ht 73.0 in | Wt 236.0 lb

## 2015-05-01 DIAGNOSIS — M25512 Pain in left shoulder: Secondary | ICD-10-CM

## 2015-05-01 MED ORDER — PREDNISONE 10 MG PO TABS: ORAL_TABLET | ORAL | Status: DC

## 2015-05-01 NOTE — Patient Instructions (Signed)
Use warm wet compresses and continue to take diclofenac twice daily on a more regular basis and avoid heavy lifting pushing or pulling for a few days to see if this will resolve on its own We will call you with the official reading by the radiologist as soon as that becomes available We will give you a prescription for prednisone taper and if you keep having trouble you may want to go on and try this and while taking this leave off the diclofenac. If problems continue be on this treatment regimen will get one of the orthopedic doctors to come to the office to check you to see if they have any other suggestions for management of the pain

## 2015-05-01 NOTE — Progress Notes (Signed)
Subjective:    Patient ID: Marcus Jensen, male    DOB: 09/27/1951, 64 y.o.   MRN: [id number]  HPI Patient here today for left shoulder pain that started about 2-3 weeks ago.      Patient Active Problem List   Diagnosis Date Noted  . Thoracic aorta atherosclerosis 12/04/2014  . Hyperlipemia 02/01/2014  . Cholelithiasis 02/01/2014  . Cervical disc disease 02/01/2014  . Spinal stenosis of cervical region 02/01/2014  . Spermatocele 02/01/2014  . Hydrocele, right 02/01/2014  . Umbilical hernia 99/37/1696  . HEMORRHAGE OF RECTUM AND ANUS 11/11/2009  . HEARTBURN 11/11/2009   Outpatient Encounter Prescriptions as of 05/01/2015  Medication Sig  . b complex vitamins capsule Take 1 capsule by mouth daily.  . cholecalciferol (VITAMIN D) 1000 UNITS tablet Take 2,000 Units by mouth daily.  . Cyanocobalamin (VITAMIN B 12 PO) Take 1 tablet by mouth daily.  . diclofenac (VOLTAREN) 75 MG EC tablet Take 1 tablet (75 mg total) by mouth 2 (two) times daily.  . fluticasone (FLONASE) 50 MCG/ACT nasal spray Place 2 sprays into both nostrils at bedtime.  . gabapentin (NEURONTIN) 400 MG capsule Take 800 mg by mouth 2 (two) times daily.  Marland Kitchen glucosamine-chondroitin 500-400 MG tablet Take 1 tablet by mouth 3 (three) times daily.  . Omega-3 Fatty Acids (FISH OIL) 1000 MG CAPS Take 2 capsules by mouth daily.  . rosuvastatin (CRESTOR) 20 MG tablet Take one tablet by mouth daily as directed   No facility-administered encounter medications on file as of 05/01/2015.      Review of Systems  Constitutional: Negative.   HENT: Negative.   Eyes: Negative.   Respiratory: Negative.   Cardiovascular: Negative.   Gastrointestinal: Negative.   Endocrine: Negative.   Genitourinary: Negative.   Musculoskeletal: Positive for arthralgias (left shoulder pain).  Skin: Negative.   Allergic/Immunologic: Negative.   Neurological: Negative.   Hematological: Negative.   Psychiatric/Behavioral: Negative.          Objective:   Physical Exam  Constitutional: He is oriented to person, place, and time. He appears well-developed and well-nourished. No distress.  HENT:  Head: Normocephalic.  Eyes: Conjunctivae and EOM are normal. Pupils are equal, round, and reactive to light. Right eye exhibits no discharge. Left eye exhibits no discharge. No scleral icterus.  Neck: Normal range of motion. Neck supple.  Musculoskeletal: Normal range of motion. He exhibits no edema or tenderness.  The patient has fairly good range of motion of the left shoulder and no point tenderness was found over the deltoid biceps tendon or around the scapula. He was able to fully abduct and adduct the shoulder without discomfort.  Neurological: He is alert and oriented to person, place, and time.  Skin: Skin is warm and dry. No rash noted.  Psychiatric: He has a normal mood and affect. His behavior is normal. Judgment and thought content normal.  Nursing note and vitals reviewed.  BP 126/76 mmHg  Pulse 64  Temp(Src) 97.7 F (36.5 C) (Oral)  Ht 6\' 1"  (1.854 m)  Wt 236 lb (107.049 kg)  BMI 31.14 kg/m2  [facility] reading (PRIMARY) by  Dr.Moore-left shoulder no obvious abnormality other than these between the humerus and the shoulder joint seems to be much wider than usual especially compared to the right  Assessment & Plan:  1. Left shoulder pain -Use warm wet compresses and continue with diclofenac and if problems continue hold the diclofenac and try a short course of prednisone - DG Shoulder Left; Future - predniSONE (DELTASONE) 10 MG tablet; 1 tablet 4 times a day for 2 days,  1 tablet 3 times a day for 2 days,  1 tablet 2 times a day for 2 days, 1 tablet daily for 2 days  Dispense: 20 tablet; Refill: 0  Patient Instructions  Use warm wet compresses and continue to take diclofenac twice daily on a more regular basis and avoid heavy lifting pushing or pulling for a few days to see if this  will resolve on its own We will call you with the official reading by the radiologist as soon as that becomes available We will give you a prescription for prednisone taper and if you keep having trouble you may want to go on and try this and while taking this leave off the diclofenac. If problems continue be on this treatment regimen will get one of the orthopedic doctors to come to the office to check you to see if they have any other suggestions for management of the pain   Arrie Senate MD

## 2015-06-11 ENCOUNTER — Encounter: Payer: Self-pay | Admitting: Family Medicine

## 2015-06-11 ENCOUNTER — Ambulatory Visit (INDEPENDENT_AMBULATORY_CARE_PROVIDER_SITE_OTHER): Payer: BLUE CROSS/BLUE SHIELD | Admitting: Family Medicine

## 2015-06-11 VITALS — BP 124/79 | HR 69 | Temp 97.8°F | Ht 73.0 in | Wt 225.0 lb

## 2015-06-11 DIAGNOSIS — N433 Hydrocele, unspecified: Secondary | ICD-10-CM

## 2015-06-11 DIAGNOSIS — E785 Hyperlipidemia, unspecified: Secondary | ICD-10-CM

## 2015-06-11 DIAGNOSIS — N400 Enlarged prostate without lower urinary tract symptoms: Secondary | ICD-10-CM | POA: Insufficient documentation

## 2015-06-11 DIAGNOSIS — E559 Vitamin D deficiency, unspecified: Secondary | ICD-10-CM

## 2015-06-11 DIAGNOSIS — M25512 Pain in left shoulder: Secondary | ICD-10-CM

## 2015-06-11 DIAGNOSIS — Z0000 Encounter for general adult medical examination without abnormal findings: Secondary | ICD-10-CM | POA: Diagnosis not present

## 2015-06-11 DIAGNOSIS — K429 Umbilical hernia without obstruction or gangrene: Secondary | ICD-10-CM

## 2015-06-11 DIAGNOSIS — J301 Allergic rhinitis due to pollen: Secondary | ICD-10-CM

## 2015-06-11 DIAGNOSIS — Z23 Encounter for immunization: Secondary | ICD-10-CM

## 2015-06-11 LAB — POCT URINALYSIS DIPSTICK
Bilirubin, UA: NEGATIVE
Blood, UA: NEGATIVE
Glucose, UA: NEGATIVE
KETONES UA: NEGATIVE
LEUKOCYTES UA: NEGATIVE
Nitrite, UA: NEGATIVE
Protein, UA: NEGATIVE
Spec Grav, UA: 1.015
Urobilinogen, UA: NEGATIVE
pH, UA: 5

## 2015-06-11 LAB — POCT CBC
GRANULOCYTE PERCENT: 63.1 % (ref 37–80)
HEMATOCRIT: 42.9 % — AB (ref 43.5–53.7)
Hemoglobin: 14.3 g/dL (ref 14.1–18.1)
LYMPH, POC: 1 (ref 0.6–3.4)
MCH, POC: 30.3 pg (ref 27–31.2)
MCHC: 33.3 g/dL (ref 31.8–35.4)
MCV: 90.8 fL (ref 80–97)
MPV: 6.6 fL (ref 0–99.8)
POC GRANULOCYTE: 2.3 (ref 2–6.9)
POC LYMPH PERCENT: 27.4 %L (ref 10–50)
Platelet Count, POC: 206 10*3/uL (ref 142–424)
RBC: 4.73 M/uL (ref 4.69–6.13)
RDW, POC: 11.8 %
WBC: 3.6 10*3/uL — AB (ref 4.6–10.2)

## 2015-06-11 LAB — POCT UA - MICROSCOPIC ONLY
Bacteria, U Microscopic: NEGATIVE
CASTS, UR, LPF, POC: NEGATIVE
Crystals, Ur, HPF, POC: NEGATIVE
MUCUS UA: NEGATIVE
RBC, urine, microscopic: NEGATIVE
WBC, Ur, HPF, POC: NEGATIVE
Yeast, UA: NEGATIVE

## 2015-06-11 MED ORDER — ROSUVASTATIN CALCIUM 20 MG PO TABS: ORAL_TABLET | ORAL | Status: DC

## 2015-06-11 NOTE — Progress Notes (Signed)
Subjective:    Patient ID: Marcus Jensen, male    DOB: [birth year], 64 y.o.   MRN: [id number]  HPI Patient is here today for annual wellness exam and follow up of chronic medical problems which includes hyperlipidemia. He is taking medications regularly. The patient continues to have problems with his left shoulder and his neck and some general arthralgias. He is due to receive an FOBT to return. He is also due to get lab work today. The patient complains mostly of left shoulder pain as indicated and he is due to get an eye exam. He is somewhat stressed by his mother is 67 years old and in a nursing home and she is placing a lot of demands on him psychologically. He denies chest pain shortness of breath trouble swallowing but does have occasional heartburn. He is voiding and occasionally notices this is slower than usual. He does not have any burning. He is due and needs a shingles shot and Prevnar shot. He will be given the shingles shot today and will return to the clinic for the Prevnar vaccine. He is also due to get an eye exam.      Patient Active Problem List   Diagnosis Date Noted  . Thoracic aorta atherosclerosis 12/04/2014  . Hyperlipemia 02/01/2014  . Cholelithiasis 02/01/2014  . Cervical disc disease 02/01/2014  . Spinal stenosis of cervical region 02/01/2014  . Spermatocele 02/01/2014  . Hydrocele, right 02/01/2014  . Umbilical hernia 83/38/2505  . HEMORRHAGE OF RECTUM AND ANUS 11/11/2009  . HEARTBURN 11/11/2009   Outpatient Encounter Prescriptions as of 06/11/2015  Medication Sig  . b complex vitamins capsule Take 1 capsule by mouth daily.  . cholecalciferol (VITAMIN D) 1000 UNITS tablet Take 2,000 Units by mouth daily.  . Cyanocobalamin (VITAMIN B 12 PO) Take 1 tablet by mouth daily.  Marland Kitchen gabapentin (NEURONTIN) 400 MG capsule Take 800 mg by mouth 2 (two) times daily.  Marland Kitchen glucosamine-chondroitin 500-400 MG tablet Take 1 tablet by mouth 3 (three) times daily.  . Omega-3  Fatty Acids (FISH OIL) 1000 MG CAPS Take 2 capsules by mouth daily.  . rosuvastatin (CRESTOR) 20 MG tablet Take one tablet by mouth daily as directed  . [DISCONTINUED] diclofenac (VOLTAREN) 75 MG EC tablet Take 1 tablet (75 mg total) by mouth 2 (two) times daily.  . [DISCONTINUED] fluticasone (FLONASE) 50 MCG/ACT nasal spray Place 2 sprays into both nostrils at bedtime.  . [DISCONTINUED] predniSONE (DELTASONE) 10 MG tablet 1 tablet 4 times a day for 2 days,  1 tablet 3 times a day for 2 days,  1 tablet 2 times a day for 2 days, 1 tablet daily for 2 days   No facility-administered encounter medications on file as of 06/11/2015.      Review of Systems  Constitutional: Negative.   HENT: Negative.   Eyes: Negative.   Respiratory: Negative.   Cardiovascular: Negative.   Gastrointestinal: Negative.   Endocrine: Negative.   Genitourinary: Negative.   Musculoskeletal: Positive for arthralgias (left shoulder ).  Skin: Negative.   Allergic/Immunologic: Negative.   Neurological: Negative.   Hematological: Negative.   Psychiatric/Behavioral: Negative.        Objective:   Physical Exam  Constitutional: He is oriented to person, place, and time. He appears well-developed and well-nourished. No distress.  HENT:  Head: Normocephalic and atraumatic.  Right Ear: External ear normal.  Left Ear: External ear normal.  Mouth/Throat: Oropharynx is clear and moist. No oropharyngeal exudate.  Nasal congestion bilaterally  Eyes: Conjunctivae and EOM are normal. Pupils are equal, round, and reactive to light. Right eye exhibits no discharge. Left eye exhibits no discharge. No scleral icterus.  Neck: Normal range of motion. Neck supple. No thyromegaly present.  No carotid bruits or anterior cervical adenopathy  Cardiovascular: Normal rate, regular rhythm, normal heart sounds and intact distal pulses.   No murmur heard. At 60/m  Pulmonary/Chest: Effort normal and breath sounds normal. No respiratory  distress. He has no wheezes. He has no rales. He exhibits no tenderness.  Clear anteriorly and posteriorly and no axillary adenopathy and no chest wall masses  Abdominal: Soft. Bowel sounds are normal. He exhibits no mass. There is no tenderness. There is no rebound and no guarding.  The patient has an umbilical hernia. There is no organ enlargement or inguinal adenopathy  Genitourinary: Rectum normal and penis normal.  The prostate is slightly enlarged but smooth with the right being larger than the left. There are no rectal masses. There were no inguinal hernias palpable. The external genitalia were within normal limits other than a large hydrocele on the right which is been there from the past.  Musculoskeletal: Normal range of motion. He exhibits no edema or tenderness.  There is some slight tenderness at the left before meals joint  Lymphadenopathy:    He has no cervical adenopathy.  Neurological: He is alert and oriented to person, place, and time. He has normal reflexes. No cranial nerve deficit.  Skin: Skin is warm and dry. No rash noted. No erythema. No pallor.  Psychiatric: He has a normal mood and affect. His behavior is normal. Judgment and thought content normal.  Nursing note and vitals reviewed.  BP 124/79 mmHg  Pulse 69  Temp(Src) 97.8 F (36.6 C) (Oral)  Ht _0  (1.854 m)  Wt 225 lb (102.059 kg)  BMI 29.69 kg/m2        Assessment & Plan:  1. Hyperlipemia -Continue current treatment pending results of lab work - POCT CBC - BMP8+EGFR - Hepatic function panel - NMR, lipoprofile  2. Vitamin D deficiency -Continue current treatment pending results of lab work - POCT CBC - Vit D  25 hydroxy (rtn osteoporosis monitoring)  3. Annual physical exam -The patient's next colonoscopy is not due until 2020. He will get a shingles shot today and will return to clinic after a month and received the Prevnar vaccine. -He is due to get an eye exam and he was made aware of this  during the visit. - POCT CBC - BMP8+EGFR - Hepatic function panel - POCT urinalysis dipstick - POCT UA - Microscopic Only - NMR, lipoprofile - Thyroid Panel With TSH - Vit D  25 hydroxy (rtn osteoporosis monitoring) - PSA Total+%Free (Serial)  4. Left shoulder pain -Currently the left shoulder is slightly improved and he understands that he can return to clinic for a shot of Depo-Medrol to the left before meals joint if his problem gets worse.  5. Allergic rhinitis due to pollen -Antihistamines when necessary and Flonase over-the-counter if needed  6. Umbilical hernia without obstruction and without gangrene -This persists and he is having no problems with this at this time.  7. Hydrocele, right -He continues to have a right hydrocele but no symptoms related to this.  8. BPH (benign prostatic hyperplasia) -He does have some slowing in his voiding but this is stable.  Meds ordered this encounter  Medications  . rosuvastatin (CRESTOR) 20 MG tablet    Sig: Take one tablet  by mouth daily as directed    Dispense:  90 tablet    Refill:  3   Patient Instructions  Continue current medications. Continue good therapeutic lifestyle changes which include good diet and exercise. Fall precautions discussed with patient. If an FOBT was given today- please return it to our front desk. If you are over 58 years old - you may need Prevnar 57 or the adult Pneumonia vaccine.  Flu Shots are still available at our office. If you still haven't had one please call to set up a nurse visit to get one.   After your visit with [place] today you will receive a survey in the mail or online from [employer] regarding your care with [place]. Please take a moment to fill this out. Your feedback is very important to [place] as you can help [place] better understand your patient needs as well as improve your experience and satisfaction. WE CARE ABOUT YOU!!!   Continue to take current medications and any changes will be  determined after the lab work is returned. Return to clinic in about 4-5 weeks and get the Prevnar vaccine Continue to drink plenty of fluids If the left shoulder continues to bother you, return and we will do an injection into the left acromioclavicular joint Return the FOBT The summer try to excise as much as possible   Arrie Senate MD

## 2015-06-11 NOTE — Patient Instructions (Addendum)
Continue current medications. Continue good therapeutic lifestyle changes which include good diet and exercise. Fall precautions discussed with patient. If an FOBT was given today- please return it to our front desk. If you are over 62 years old - you may need Prevnar 20 or the adult Pneumonia vaccine.  Flu Shots are still available at our office. If you still haven't had one please call to set up a nurse visit to get one.   After your visit with [place] today you will receive a survey in the mail or online from [employer] regarding your care with [place]. Please take a moment to fill this out. Your feedback is very important to [place] as you can help [place] better understand your patient needs as well as improve your experience and satisfaction. WE CARE ABOUT YOU!!!   Continue to take current medications and any changes will be determined after the lab work is returned. Return to clinic in about 4-5 weeks and get the Prevnar vaccine Continue to drink plenty of fluids If the left shoulder continues to bother you, return and we will do an injection into the left acromioclavicular joint Return the FOBT The summer try to excise as much as possible

## 2015-06-12 LAB — BMP8+EGFR
BUN / CREAT RATIO: 10 (ref 10–22)
BUN: 11 mg/dL (ref 8–27)
CALCIUM: 9.5 mg/dL (ref 8.6–10.2)
CO2: 27 mmol/L (ref 18–29)
CREATININE: 1.14 mg/dL (ref 0.76–1.27)
Chloride: 100 mmol/L (ref 97–108)
GFR, EST AFRICAN AMERICAN: 79 mL/min/{1.73_m2} (ref >59)
GFR, EST NON AFRICAN AMERICAN: 69 mL/min/{1.73_m2} (ref >59)
Glucose: 89 mg/dL (ref 65–99)
Potassium: 4.6 mmol/L (ref 3.5–5.2)
Sodium: 140 mmol/L (ref 134–144)

## 2015-06-12 LAB — HEPATIC FUNCTION PANEL
ALT: 19 IU/L (ref 0–44)
AST: 25 IU/L (ref 0–40)
Albumin: 4.4 g/dL (ref 3.6–4.8)
Alkaline Phosphatase: 80 IU/L (ref 39–117)
Bilirubin Total: 0.8 mg/dL (ref 0.0–1.2)
Bilirubin, Direct: 0.23 mg/dL (ref 0.00–0.40)
TOTAL PROTEIN: 6.8 g/dL (ref 6.0–8.5)

## 2015-06-12 LAB — VITAMIN D 25 HYDROXY (VIT D DEFICIENCY, FRACTURES): Vit D, 25-Hydroxy: 51.3 ng/mL (ref 30.0–100.0)

## 2015-06-12 LAB — THYROID PANEL WITH TSH
Free Thyroxine Index: 2.7 (ref 1.2–4.9)
T3 Uptake Ratio: 27 % (ref 24–39)
T4, Total: 10 ug/dL (ref 4.5–12.0)
TSH: 0.439 u[IU]/mL — AB (ref 0.450–4.500)

## 2015-06-12 LAB — NMR, LIPOPROFILE
Cholesterol: 173 mg/dL (ref 100–199)
HDL CHOLESTEROL BY NMR: 51 mg/dL (ref >39)
HDL PARTICLE NUMBER: 28.1 umol/L — AB (ref >=30.5)
LDL Particle Number: 1138 nmol/L — ABNORMAL HIGH (ref <1000)
LDL Size: 20.9 nm (ref >20.5)
LDL-C: 108 mg/dL — ABNORMAL HIGH (ref 0–99)
LP-IR Score: 45 (ref <=45)
SMALL LDL PARTICLE NUMBER: 418 nmol/L (ref <=527)
Triglycerides by NMR: 70 mg/dL (ref 0–149)

## 2015-06-12 LAB — PSA TOTAL+% FREE (SERIAL)
PROSTATE SPECIFIC AG, SERUM: 2.3 ng/mL (ref 0.0–4.0)
PSA, Free Pct: 26.1 %
PSA, Free: 0.6 ng/mL

## 2015-07-10 ENCOUNTER — Other Ambulatory Visit: Payer: BLUE CROSS/BLUE SHIELD

## 2015-07-12 LAB — FECAL OCCULT BLOOD, IMMUNOCHEMICAL: Fecal Occult Bld: NEGATIVE

## 2015-07-15 ENCOUNTER — Encounter: Payer: Self-pay | Admitting: Family Medicine

## 2015-07-18 ENCOUNTER — Encounter: Payer: Self-pay | Admitting: *Deleted

## 2015-09-16 ENCOUNTER — Encounter: Payer: Self-pay | Admitting: Family Medicine

## 2015-09-16 ENCOUNTER — Ambulatory Visit (INDEPENDENT_AMBULATORY_CARE_PROVIDER_SITE_OTHER): Payer: BLUE CROSS/BLUE SHIELD | Admitting: Family Medicine

## 2015-09-16 VITALS — BP 146/84 | HR 74 | Temp 98.3°F | Ht 73.0 in | Wt 220.8 lb

## 2015-09-16 DIAGNOSIS — J069 Acute upper respiratory infection, unspecified: Secondary | ICD-10-CM | POA: Diagnosis not present

## 2015-09-16 MED ORDER — AMOXICILLIN-POT CLAVULANATE 875-125 MG PO TABS: 1.0000 | ORAL_TABLET | Freq: Two times a day (BID) | ORAL | Status: DC

## 2015-09-16 MED ORDER — HYDROCODONE-HOMATROPINE 5-1.5 MG/5ML PO SYRP: 5.0000 mL | ORAL_SOLUTION | Freq: Four times a day (QID) | ORAL | Status: DC | PRN

## 2015-09-16 NOTE — Progress Notes (Signed)
   HPI  Patient presents today  Here for evaluation of cough  Patient ha been ll for about 2 weeks.  He describes body aches and sore throat whichhave cleared up about 3-5 days ago. He has had persistent cough and nasal congestion. After a few days of feeling better he began getting ill again and has thick green sputum with his cough,one episode with a small amount of blood, and malaise.   he works as a Animal nutritionist in town   PMH: Smoking status noted ROS: Per HPI  Objective: BP 146/84 mmHg  Pulse 74  Temp(Src) 98.3 F (36.8 C) (Oral)  Ht 6\' 1"  (1.854 m)  Wt 220 lb 12.8 oz (100.154 kg)  BMI 29.14 kg/m2 Gen: NAD, alert, cooperative with exam HEENT: NCAT CV: RRR, good S1/S2, no murmur Resp: non labored, soft crackles at BL bases Ext: No edema, warm Neuro: Alert and oriented, No gross deficits  Assessment and plan:  # URI, possible CAP With symptoms, duration, and second sickness I will gfo ahead and treat as CAP Augmentin Hycodan for cough Caution with hemoptysis discussed and he understands, likely this is just from irritation with severe cough for 2 weeks.   He has stopped crestor due to myalgia, offered lipitor- he will consider.    Meds ordered this encounter  Medications  . amoxicillin-clavulanate (AUGMENTIN) 875-125 MG per tablet    Sig: Take 1 tablet by mouth 2 (two) times daily.    Dispense:  20 tablet    Refill:  0  . HYDROcodone-homatropine (HYCODAN) 5-1.5 MG/5ML syrup    Sig: Take 5 mLs by mouth every 6 (six) hours as needed for cough.    Dispense:  120 mL    Refill:  0    Laroy Apple, MD [facility] 09/16/2015, 3:52 PM

## 2015-09-16 NOTE — Patient Instructions (Signed)
Great to meet you!   We are treating you for pneumonia with augmentin.  Please come back if you do not get better as expected and if you have recurrence of the hemoptysis.

## 2015-10-16 ENCOUNTER — Ambulatory Visit (INDEPENDENT_AMBULATORY_CARE_PROVIDER_SITE_OTHER): Payer: BLUE CROSS/BLUE SHIELD

## 2015-10-16 DIAGNOSIS — Z23 Encounter for immunization: Secondary | ICD-10-CM | POA: Diagnosis not present

## 2015-12-11 ENCOUNTER — Ambulatory Visit (INDEPENDENT_AMBULATORY_CARE_PROVIDER_SITE_OTHER): Payer: BLUE CROSS/BLUE SHIELD | Admitting: Family Medicine

## 2015-12-11 ENCOUNTER — Encounter: Payer: Self-pay | Admitting: Family Medicine

## 2015-12-11 VITALS — BP 132/82 | HR 62 | Temp 97.0°F | Ht 73.0 in | Wt 228.0 lb

## 2015-12-11 DIAGNOSIS — N400 Enlarged prostate without lower urinary tract symptoms: Secondary | ICD-10-CM | POA: Diagnosis not present

## 2015-12-11 DIAGNOSIS — E785 Hyperlipidemia, unspecified: Secondary | ICD-10-CM

## 2015-12-11 DIAGNOSIS — R002 Palpitations: Secondary | ICD-10-CM | POA: Diagnosis not present

## 2015-12-11 DIAGNOSIS — M25569 Pain in unspecified knee: Secondary | ICD-10-CM

## 2015-12-11 DIAGNOSIS — M25559 Pain in unspecified hip: Secondary | ICD-10-CM | POA: Diagnosis not present

## 2015-12-11 DIAGNOSIS — E559 Vitamin D deficiency, unspecified: Secondary | ICD-10-CM | POA: Diagnosis not present

## 2015-12-11 NOTE — Patient Instructions (Addendum)
Continue current medications. Continue good therapeutic lifestyle changes which include good diet and exercise. Fall precautions discussed with patient. If an FOBT was given today- please return it to our front desk. If you are over 62 years old - you may need Prevnar 58 or the adult Pneumonia vaccine.  **Flu shots are available--- please call and schedule a FLU-CLINIC appointment**  After your visit with [place] today you will receive a survey in the mail or online from [employer] regarding your care with [place]. Please take a moment to fill this out. Your feedback is very important to [place] as you can help [place] better understand your patient needs as well as improve your experience and satisfaction. WE CARE ABOUT YOU!!!   Reduce caffeine intake Take anti-inflammatory medicine periodically after eating Take ranitidine or Zantac twice daily for the heartburn but more importantly reduce caffeine intake Discuss with your wife about not becoming obsessed with caregiving and remember that the patient receiving Medicare may feel this is making her life more stressful. Don't go looking for problems and create more issues If the problems with the joints do not get better as far as the hip and shoulder please let us know and we will consider getting additional x-rays and/or doing injections

## 2015-12-11 NOTE — Progress Notes (Signed)
Subjective:    Patient ID: Marcus Jensen, male    DOB: 07-08-1951, 64 y.o.   MRN: [id number]  HPI Pt here for follow up and management of chronic medical problems which includes hyperlipidemia. He is taking medications regularly. The patient continues to complain of problems in his right shoulder and left hip. The patient denies chest pain or shortness of breath. These palpitations occur almost every day and it feels like his heart is skipping. They're not associated with any chest pain or shortness of breath. He does have occasional heartburn and indigestion but is drinking quite a bit of caffeine. He understands the importance of reducing the caffeine to help the palpitations and the heartburn. He does not have any blood in the stool or black tarry bowel movements. He is passing his water without problems.    Patient Active Problem List   Diagnosis Date Noted  . URI, acute 09/16/2015  . BPH (benign prostatic hyperplasia) 06/11/2015  . Thoracic aorta atherosclerosis ([place]) 12/04/2014  . Hyperlipemia 02/01/2014  . Cholelithiasis 02/01/2014  . Cervical disc disease 02/01/2014  . Spinal stenosis of cervical region 02/01/2014  . Spermatocele 02/01/2014  . Hydrocele, right 02/01/2014  . Umbilical hernia 59/56/3875  . HEMORRHAGE OF RECTUM AND ANUS 11/11/2009  . HEARTBURN 11/11/2009   Outpatient Encounter Prescriptions as of 12/11/2015  Medication Sig  . b complex vitamins capsule Take 1 capsule by mouth daily.  . cholecalciferol (VITAMIN D) 1000 UNITS tablet Take 2,000 Units by mouth daily.  . Cyanocobalamin (VITAMIN B 12 PO) Take 1 tablet by mouth daily.  Marland Kitchen gabapentin (NEURONTIN) 400 MG capsule Take 800 mg by mouth 2 (two) times daily.  Marland Kitchen glucosamine-chondroitin 500-400 MG tablet Take 1 tablet by mouth 3 (three) times daily.  . Omega-3 Fatty Acids (FISH OIL) 1000 MG CAPS Take 2 capsules by mouth daily.  . [DISCONTINUED] amoxicillin-clavulanate (AUGMENTIN) 875-125 MG per tablet Take 1  tablet by mouth 2 (two) times daily.  . [DISCONTINUED] HYDROcodone-homatropine (HYCODAN) 5-1.5 MG/5ML syrup Take 5 mLs by mouth every 6 (six) hours as needed for cough.   No facility-administered encounter medications on file as of 12/11/2015.      Review of Systems  Constitutional: Negative.   HENT: Negative.   Eyes: Negative.   Respiratory: Negative.   Cardiovascular: Positive for palpitations.  Gastrointestinal: Negative.   Endocrine: Negative.   Genitourinary: Negative.   Musculoskeletal: Positive for arthralgias (right shoulder and left hip).  Skin: Negative.   Allergic/Immunologic: Negative.   Neurological: Negative.   Hematological: Negative.   Psychiatric/Behavioral: Negative.        Objective:   Physical Exam  Constitutional: He is oriented to person, place, and time. He appears well-developed and well-nourished.  HENT:  Head: Normocephalic and atraumatic.  Right Ear: External ear normal.  Left Ear: External ear normal.  Mouth/Throat: Oropharynx is clear and moist. No oropharyngeal exudate.  Some nasal congestion bilaterally  Eyes: Conjunctivae and EOM are normal. Pupils are equal, round, and reactive to light. Right eye exhibits no discharge. Left eye exhibits no discharge. No scleral icterus.  A recent eye exam was normal.  Neck: Normal range of motion. Neck supple. No thyromegaly present.  Cardiovascular: Normal rate, regular rhythm, normal heart sounds and intact distal pulses.   No murmur heard. The heart today has a regular rate and rhythm at 60/m  Pulmonary/Chest: Effort normal and breath sounds normal. No respiratory distress. He has no wheezes. He has no rales. He exhibits no tenderness.  There  is no axillary adenopathy and no chest wall masses  Abdominal: Soft. Bowel sounds are normal. He exhibits no mass. There is no tenderness. There is no rebound and no guarding.  There is no epigastric tenderness liver or spleen enlargement or inguinal adenopathy    Musculoskeletal: Normal range of motion. He exhibits no edema or tenderness.  Leg raising is good bilaterally and hip abduction is good bilaterally. There is no before meals joint tenderness of the affected shoulder.  Lymphadenopathy:    He has no cervical adenopathy.  Neurological: He is alert and oriented to person, place, and time. He has normal reflexes. No cranial nerve deficit.  Skin: Skin is warm and dry. No rash noted.  Psychiatric: He has a normal mood and affect. His behavior is normal. Judgment and thought content normal.  Nursing note and vitals reviewed.   BP 132/82 mmHg  Pulse 62  Temp(Src) 97 F (36.1 C) (Oral)  Ht _0  (1.854 m)  Wt 228 lb (103.42 kg)  BMI 30.09 kg/m2  EKG: First-degree AV block. Patient was informed of results during the visit      Assessment & Plan:  1. Hyperlipemia -Continue aggressive therapeutic lifestyle changes and current treatment pending results of lab work - BMP8+EGFR - CBC with Differential/Platelet - Hepatic function panel - NMR, lipoprofile  2. Vitamin D deficiency -Continue current treatment pending results of lab work - CBC with Differential/Platelet - VITAMIN D 25 Hydroxy (Vit-D Deficiency, Fractures)  3. BPH (benign prostatic hyperplasia) -The patient is having no symptoms with this currently and his prostate exam is not due for 6 months. - CBC with Differential/Platelet  4. Palpitations -The heart today was regular and the EKG revealed a first-degree AV block but no irregularities - EKG 12-Lead - Cardiac event monitor  5. Chronic arthralgias of knees and hips, unspecified laterality -Continue Naprosyn enteric-coated after eating on an as-needed basis and protect stomach with ranitidine  Patient Instructions  Continue current medications. Continue good therapeutic lifestyle changes which include good diet and exercise. Fall precautions discussed with patient. If an FOBT was given today- please return it to our  front desk. If you are over 7 years old - you may need Prevnar 55 or the adult Pneumonia vaccine.  **Flu shots are available--- please call and schedule a FLU-CLINIC appointment**  After your visit with [place] today you will receive a survey in the mail or online from [employer] regarding your care with [place]. Please take a moment to fill this out. Your feedback is very important to [place] as you can help [place] better understand your patient needs as well as improve your experience and satisfaction. WE CARE ABOUT YOU!!!   Reduce caffeine intake Take anti-inflammatory medicine periodically after eating Take ranitidine or Zantac twice daily for the heartburn but more importantly reduce caffeine intake Discuss with your wife about not becoming obsessed with caregiving and remember that the patient receiving Medicare may feel this is making her life more stressful. Don't go looking for problems and create more issues If the problems with the joints do not get better as far as the hip and shoulder please let us know and we will consider getting additional x-rays and/or doing injections   Arrie Senate MD

## 2015-12-12 ENCOUNTER — Ambulatory Visit: Payer: BLUE CROSS/BLUE SHIELD | Admitting: Family Medicine

## 2015-12-12 LAB — CBC WITH DIFFERENTIAL/PLATELET
BASOS: 1 %
Basophils Absolute: 0 10*3/uL (ref 0.0–0.2)
EOS (ABSOLUTE): 0.1 10*3/uL (ref 0.0–0.4)
EOS: 2 %
HEMATOCRIT: 43.6 % (ref 37.5–51.0)
Hemoglobin: 14.9 g/dL (ref 12.6–17.7)
Immature Grans (Abs): 0 10*3/uL (ref 0.0–0.1)
Immature Granulocytes: 0 %
LYMPHS ABS: 1.1 10*3/uL (ref 0.7–3.1)
Lymphs: 24 %
MCH: 30.2 pg (ref 26.6–33.0)
MCHC: 34.2 g/dL (ref 31.5–35.7)
MCV: 88 fL (ref 79–97)
MONOS ABS: 0.4 10*3/uL (ref 0.1–0.9)
Monocytes: 9 %
Neutrophils Absolute: 3 10*3/uL (ref 1.4–7.0)
Neutrophils: 64 %
Platelets: 188 10*3/uL (ref 150–379)
RBC: 4.94 x10E6/uL (ref 4.14–5.80)
RDW: 13.4 % (ref 12.3–15.4)
WBC: 4.7 10*3/uL (ref 3.4–10.8)

## 2015-12-12 LAB — NMR, LIPOPROFILE
CHOLESTEROL: 184 mg/dL (ref 100–199)
HDL CHOLESTEROL BY NMR: 64 mg/dL (ref >39)
HDL Particle Number: 35.5 umol/L (ref >=30.5)
LDL Particle Number: 1009 nmol/L — ABNORMAL HIGH (ref <1000)
LDL SIZE: 21.5 nm (ref >20.5)
LDL-C: 102 mg/dL — ABNORMAL HIGH (ref 0–99)
LP-IR Score: <25 (ref <=45)
SMALL LDL PARTICLE NUMBER: 188 nmol/L (ref <=527)
TRIGLYCERIDES BY NMR: 92 mg/dL (ref 0–149)

## 2015-12-12 LAB — BMP8+EGFR
BUN / CREAT RATIO: 10 (ref 10–22)
BUN: 10 mg/dL (ref 8–27)
CO2: 25 mmol/L (ref 18–29)
CREATININE: 1.04 mg/dL (ref 0.76–1.27)
Calcium: 9.4 mg/dL (ref 8.6–10.2)
Chloride: 99 mmol/L (ref 96–106)
GFR calc Af Amer: 89 mL/min/{1.73_m2} (ref >59)
GFR calc non Af Amer: 77 mL/min/{1.73_m2} (ref >59)
GLUCOSE: 91 mg/dL (ref 65–99)
POTASSIUM: 4 mmol/L (ref 3.5–5.2)
SODIUM: 140 mmol/L (ref 134–144)

## 2015-12-12 LAB — HEPATIC FUNCTION PANEL
ALBUMIN: 4.6 g/dL (ref 3.6–4.8)
ALT: 34 IU/L (ref 0–44)
AST: 74 IU/L — ABNORMAL HIGH (ref 0–40)
Alkaline Phosphatase: 79 IU/L (ref 39–117)
BILIRUBIN TOTAL: 0.7 mg/dL (ref 0.0–1.2)
BILIRUBIN, DIRECT: 0.19 mg/dL (ref 0.00–0.40)
Total Protein: 6.9 g/dL (ref 6.0–8.5)

## 2015-12-12 LAB — VITAMIN D 25 HYDROXY (VIT D DEFICIENCY, FRACTURES): Vit D, 25-Hydroxy: 47.3 ng/mL (ref 30.0–100.0)

## 2016-01-01 ENCOUNTER — Encounter: Payer: Self-pay | Admitting: Cardiology

## 2016-01-01 ENCOUNTER — Ambulatory Visit: Payer: Self-pay | Admitting: Cardiology

## 2016-01-01 VITALS — BP 136/83 | HR 71 | Ht 74.0 in | Wt 233.0 lb

## 2016-01-01 DIAGNOSIS — R002 Palpitations: Secondary | ICD-10-CM | POA: Diagnosis not present

## 2016-01-01 NOTE — Progress Notes (Signed)
Cardiology Office Note   Date:  01/01/2016   ID:  Marcus Jensen, DOB August 14, 1951, MRN [id number]  PCP/Referring:  Redge Gainer, MD  Cardiologist:   Minus Breeding, MD   No chief complaint on file.     History of Present Illness: Marcus Jensen is a 65 y.o. male who presents for evaluation of palpitations.  He reports that he's been having these for some time. He describes skipped beats. He seemed to be isolated skipped beats although they might occur for 5 times in a few minutes. He does not have any sustained tachycardia arrhythmias. He doesn't get any other symptoms with these doesn't feel like she's having syncope or presyncope.  The patient denies any new symptoms such as chest discomfort, neck or arm discomfort. There has been no new shortness of breath, PND or orthopnea. He has had some increased stress in his job. Also he drinks a significant amount of caffeine. He has worn an event monitor and I reviewed these. He's had some PACs relatively rare. He did have a treadmill test in 2015 and this was negative for any evidence of ischemia I reviewed these tracings.    Past Medical History  Diagnosis Date  . Hyperlipidemia   . Peripheral neuropathy ([place])   . Arthritis     Cervical disc disease    Past Surgical History  Procedure Laterality Date  . Knee surgery Bilateral   . Hand surgery Left      Current Outpatient Prescriptions  Medication Sig Dispense Refill  . b complex vitamins capsule Take 1 capsule by mouth daily.    . cholecalciferol (VITAMIN D) 1000 UNITS tablet Take 2,000 Units by mouth daily.    . Cyanocobalamin (VITAMIN B 12 PO) Take 1 tablet by mouth daily.    Marland Kitchen gabapentin (NEURONTIN) 400 MG capsule Take 400 mg by mouth 2 (two) times daily.     Marland Kitchen glucosamine-chondroitin 500-400 MG tablet Take 2 tablets by mouth 2 (two) times daily.     . Omega-3 Fatty Acids (FISH OIL) 1000 MG CAPS Take 2 capsules by mouth daily.     No current facility-administered  medications for this visit.    Allergies:   Review of patient's allergies indicates no known allergies.    Social History:  The patient  reports that he quit smoking about 25 years ago. He has quit using smokeless tobacco. He reports that he does not drink alcohol or use illicit drugs.   Family History:  The patient's family history includes CVA in his father and mother; Cancer in his brother.    ROS:  Please see the history of present illness.   Otherwise, review of systems are positive for none.   All other systems are reviewed and negative.    PHYSICAL EXAM: VS:  BP 136/83 mmHg  Pulse 71  Ht 6\' 2"  (1.88 m)  Wt 233 lb (105.688 kg)  BMI 29.90 kg/m2 , BMI Body mass index is 29.9 kg/(m^2). GENERAL:  Well appearing HEENT:  Pupils equal round and reactive, fundi not visualized, oral mucosa unremarkable NECK:  No jugular venous distention, waveform within normal limits, carotid upstroke brisk and symmetric, no bruits, no thyromegaly LYMPHATICS:  No cervical, inguinal adenopathy LUNGS:  Clear to auscultation bilaterally BACK:  No CVA tenderness CHEST:  Unremarkable HEART:  PMI not displaced or sustained,S1 and S2 within normal limits, no S3, no S4, no clicks, no rubs, no murmurs ABD:  Flat, positive bowel sounds normal in frequency in pitch,  no bruits, no rebound, no guarding, no midline pulsatile mass, no hepatomegaly, no splenomegaly EXT:  2 plus pulses throughout, no edema, no cyanosis no clubbing SKIN:  No rashes no nodules NEURO:  Cranial nerves II through XII grossly intact, motor grossly intact throughout PSYCH:  Cognitively intact, oriented to person place and time    EKG:  EKG is not ordered today. The ekg ordered 12/02/15 demonstrates normal sinus rhythm, rate 58, axis within normal limits, intervals within normal limits, no acute ST-T wave changes..   Recent Labs: 06/11/2015: Hemoglobin 14.3; TSH 0.439* 12/11/2015: ALT 34; BUN 10; Creatinine, Ser 1.04; Platelets 188;  Potassium 4.0; Sodium 140    Lipid Panel    Component Value Date/Time   CHOL 184 12/11/2015 1048   TRIG 92 12/11/2015 1048   HDL 64 12/11/2015 1048   LDLCALC 91 06/05/2014 1137      Wt Readings from Last 3 Encounters:  01/01/16 233 lb (105.688 kg)  12/11/15 228 lb (103.42 kg)  09/16/15 220 lb 12.8 oz (100.154 kg)      Other studies Reviewed: Additional studies/ records that were reviewed today include: I personally reviewed the stress test and event monitor strips.  . Review of the above records demonstrates:  Please see elsewhere in the note.     ASSESSMENT AND PLAN:  PALPITATIONS:  He has PACs. We talked about these complaints. These might be related to Pain. I don't suspect any structural heart disease. He will consider permanently reducing his caffeine but at this point does not medical therapy. I don't think further imaging is indicated.   DYSLIPIDEMIA:  He has some mild dyslipidemia and is managing this with diet alone.   Current medicines are reviewed at length with the patient today.  The patient does not have concerns regarding medicines.  The following changes have been made:  no change  Labs/ tests ordered today include: None  No orders of the defined types were placed in this encounter.     Disposition:   FU with me as needed.     Signed, Minus Breeding, MD  01/01/2016 9:42 AM    [facility]

## 2016-01-01 NOTE — Patient Instructions (Signed)
Medication Instructions:  The current medical regimen is effective;  continue present plan and medications.  Follow-Up: Follow up as needed with Dr Hochrein.  If you need a refill on your cardiac medications before your next appointment, please call your pharmacy.  Thank you for choosing [facility]!!       

## 2016-01-23 ENCOUNTER — Encounter: Payer: Self-pay | Admitting: Nurse Practitioner

## 2016-01-23 ENCOUNTER — Ambulatory Visit (INDEPENDENT_AMBULATORY_CARE_PROVIDER_SITE_OTHER): Payer: BLUE CROSS/BLUE SHIELD | Admitting: Nurse Practitioner

## 2016-01-23 VITALS — BP 136/72 | HR 69 | Temp 97.8°F | Ht 73.0 in | Wt 232.0 lb

## 2016-01-23 DIAGNOSIS — L03113 Cellulitis of right upper limb: Secondary | ICD-10-CM | POA: Diagnosis not present

## 2016-01-23 MED ORDER — SULFAMETHOXAZOLE-TRIMETHOPRIM 800-160 MG PO TABS: 1.0000 | ORAL_TABLET | Freq: Two times a day (BID) | ORAL | Status: DC

## 2016-01-23 NOTE — Patient Instructions (Signed)

## 2016-01-23 NOTE — Progress Notes (Signed)
   Subjective:    Patient ID: Marcus Jensen, male    DOB: 19-Apr-1951, 65 y.o.   MRN: [id number]  HPI Patient comes in c/o a sore place on his right arm- noticed it about 1 week ago- he says is getting "angry looking". Not sure what caused it- does not remember getting bit by anything.    Review of Systems  Constitutional: Negative.   HENT: Negative.   Respiratory: Negative.   Cardiovascular: Negative.   Gastrointestinal: Negative.   Genitourinary: Negative.   Neurological: Negative.   Psychiatric/Behavioral: Negative.   All other systems reviewed and are negative.      Objective:   Physical Exam  Constitutional: He appears well-developed and well-nourished.  Cardiovascular: Normal rate, regular rhythm and normal heart sounds.   Pulmonary/Chest: Effort normal and breath sounds normal.  Skin: Skin is warm.  5cm indurated erythematous lesion on left forearm  Psychiatric: He has a normal mood and affect. His behavior is normal. Judgment and thought content normal.    BP 136/72 mmHg  Pulse 69  Temp(Src) 97.8 F (36.6 C) (Oral)  Ht 6\' 1"  (1.854 m)  Wt 232 lb (105.235 kg)  BMI 30.62 kg/m2       Assessment & Plan:  1. Cellulitis of right upper extremity Moist heat Do not pick or scratch RTO prn  Meds ordered this encounter  Medications  . sulfamethoxazole-trimethoprim (BACTRIM DS) 800-160 MG tablet    Sig: Take 1 tablet by mouth 2 (two) times daily.    Dispense:  14 tablet    Refill:  0    Order Specific Question:  Supervising Provider    Answer:  Chipper Herb [[id number]]   [place], FNP

## 2016-01-31 ENCOUNTER — Encounter: Payer: Self-pay | Admitting: Nurse Practitioner

## 2016-01-31 ENCOUNTER — Telehealth: Payer: Self-pay | Admitting: Nurse Practitioner

## 2016-01-31 NOTE — Telephone Encounter (Signed)
Please advise 

## 2016-01-31 NOTE — Telephone Encounter (Signed)
ntbs

## 2016-01-31 NOTE — Telephone Encounter (Signed)
Patient notified and he will call back for an appt

## 2016-02-01 ENCOUNTER — Ambulatory Visit (INDEPENDENT_AMBULATORY_CARE_PROVIDER_SITE_OTHER): Payer: BLUE CROSS/BLUE SHIELD | Admitting: Family

## 2016-02-01 ENCOUNTER — Encounter: Payer: Self-pay | Admitting: Family

## 2016-02-01 VITALS — BP 121/73 | HR 66 | Temp 97.9°F | Ht 73.0 in | Wt 232.0 lb

## 2016-02-01 DIAGNOSIS — L0291 Cutaneous abscess, unspecified: Secondary | ICD-10-CM | POA: Diagnosis not present

## 2016-02-01 MED ORDER — DOXYCYCLINE HYCLATE 100 MG PO TABS: 100.0000 mg | ORAL_TABLET | Freq: Two times a day (BID) | ORAL | Status: DC

## 2016-02-01 NOTE — Progress Notes (Signed)
   Subjective:    Patient ID: Marcus Jensen, male    DOB: February 03, 1951, 65 y.o.   MRN: [id number]  HPI PT presents to the office today for an abscess on right lower arm. Pt was given rx of bactrim for 7 days. Pt reports draining a yellow/clear drainage. Pt states the abscess is improved, but continues to be swollen, erythemas and mild tenderness.    Review of Systems  Constitutional: Negative.   HENT: Negative.   Respiratory: Negative.   Cardiovascular: Negative.   Gastrointestinal: Negative.   Endocrine: Negative.   Genitourinary: Negative.   Musculoskeletal: Negative.   Neurological: Negative.   Hematological: Negative.   Psychiatric/Behavioral: Negative.   All other systems reviewed and are negative.      Objective:   Physical Exam  Constitutional: He is oriented to person, place, and time. He appears well-developed and well-nourished. No distress.  HENT:  Head: Normocephalic.  Eyes: Pupils are equal, round, and reactive to light. Right eye exhibits no discharge. Left eye exhibits no discharge.  Neck: Normal range of motion. Neck supple. No thyromegaly present.  Cardiovascular: Normal rate, regular rhythm, normal heart sounds and intact distal pulses.   No murmur heard. Pulmonary/Chest: Effort normal and breath sounds normal. No respiratory distress. He has no wheezes.  Abdominal: Soft. Bowel sounds are normal. He exhibits no distension. There is no tenderness.  Musculoskeletal: Normal range of motion. He exhibits no edema or tenderness.  Neurological: He is alert and oriented to person, place, and time. He has normal reflexes. No cranial nerve deficit.  Skin: Skin is warm and dry. No rash noted. No erythema.  Hard Abscess on right lower forearm- approx 2 cm X 2 cm with erythemas and mild warmth, no drainage present  Psychiatric: He has a normal mood and affect. His behavior is normal. Judgment and thought content normal.  Vitals reviewed.   BP 121/73 mmHg  Pulse 66   Temp(Src) 97.9 F (36.6 C) (Oral)  Ht 6\' 1"  (1.854 m)  Wt 232 lb (105.235 kg)  BMI 30.62 kg/m2       Assessment & Plan:  1. Abscess -Warm compresses -Do not squeeze -RTO prn  - doxycycline (VIBRA-TABS) 100 MG tablet; Take 1 tablet (100 mg total) by mouth 2 (two) times daily.  Dispense: 20 tablet; Refill: 0  Evelina Dun, FNP

## 2016-02-01 NOTE — Patient Instructions (Signed)

## 2016-02-14 ENCOUNTER — Encounter: Payer: Self-pay | Admitting: Nurse Practitioner

## 2016-02-14 ENCOUNTER — Ambulatory Visit (INDEPENDENT_AMBULATORY_CARE_PROVIDER_SITE_OTHER): Payer: BLUE CROSS/BLUE SHIELD | Admitting: Nurse Practitioner

## 2016-02-14 VITALS — BP 126/73 | HR 83 | Temp 100.2°F | Ht 73.0 in | Wt 228.0 lb

## 2016-02-14 DIAGNOSIS — R509 Fever, unspecified: Secondary | ICD-10-CM

## 2016-02-14 DIAGNOSIS — J111 Influenza due to unidentified influenza virus with other respiratory manifestations: Secondary | ICD-10-CM

## 2016-02-14 DIAGNOSIS — J101 Influenza due to other identified influenza virus with other respiratory manifestations: Secondary | ICD-10-CM

## 2016-02-14 LAB — POCT INFLUENZA A/B
Influenza A, POC: POSITIVE — AB
Influenza B, POC: NEGATIVE

## 2016-02-14 MED ORDER — OSELTAMIVIR PHOSPHATE 75 MG PO CAPS: 75.0000 mg | ORAL_CAPSULE | Freq: Two times a day (BID) | ORAL | Status: AC

## 2016-02-14 NOTE — Progress Notes (Signed)
  Subjective:     Marcus Jensen is a 65 y.o. male here for evaluation of a cough. Onset of symptoms was 2 days ago. Symptoms have been gradually worsening since that time. The cough is dry and is aggravated by nothing. Associated symptoms include: change in voice, chills, fever, sputum production and wheezing. Patient does not have a history of asthma. Patient does not have a history of environmental allergens. Patient has not traveled recently. Patient does not have a history of smoking. Patient has not had a previous chest x-ray. Patient has had a PPD done.  The following portions of the patient's history were reviewed and updated as appropriate: allergies, current medications, past medical history and past social history.  Review of Systems Pertinent items are noted in HPI.    Objective:     General appearance: alert, cooperative, appears stated age and mild distress Eyes: conjunctivae/corneas clear. PERRL, EOM's intact. Fundi benign. Ears: normal TM's and external ear canals both ears Nose: Nares normal. Septum midline. Mucosa normal. No drainage or sinus tenderness. Throat: lips, mucosa, and tongue normal; teeth and gums normal Neck: no adenopathy, no carotid bruit, no JVD, supple, symmetrical, trachea midline and thyroid not enlarged, symmetric, no tenderness/mass/nodules Lungs: clear to auscultation bilaterally Heart: regular rate and rhythm, S1, S2 normal, no murmur, click, rub or gallop    Assessment:    Influenza    Plan:   Meds ordered this encounter  Medications  . oseltamivir (TAMIFLU) 75 MG capsule    Sig: Take 1 capsule (75 mg total) by mouth 2 (two) times daily.    Dispense:  5 capsule    Refill:  0    Drink plenty of fluid and get some rest. 1. Take meds as prescribed 2. Use a cool mist humidifier especially during the winter months and when heat has been humid. 3. Use saline nose sprays frequently 4. Saline irrigations of the nose can be very helpful if done  frequently.  * 4X daily for 1 week*  * Use of a nettie pot can be helpful with this. Follow directions with this* 5. Drink plenty of fluids 6. Keep thermostat turn down low 7.For any cough or congestion  Use plain Mucinex- regular strength or max strength is fine   * Children- consult with Pharmacist for dosing 8. For fever or aces or pains- take tylenol or ibuprofen appropriate for age and weight.  * for fevers greater than 101 orally you may alternate ibuprofen and tylenol every  3 hours.   Mary-Margaret Hassell Done, FNP

## 2016-02-14 NOTE — Patient Instructions (Signed)

## 2016-02-25 ENCOUNTER — Ambulatory Visit (INDEPENDENT_AMBULATORY_CARE_PROVIDER_SITE_OTHER): Payer: BLUE CROSS/BLUE SHIELD

## 2016-02-25 ENCOUNTER — Ambulatory Visit (INDEPENDENT_AMBULATORY_CARE_PROVIDER_SITE_OTHER): Payer: BLUE CROSS/BLUE SHIELD | Admitting: Family Medicine

## 2016-02-25 ENCOUNTER — Encounter: Payer: Self-pay | Admitting: Family Medicine

## 2016-02-25 VITALS — BP 119/64 | HR 76 | Temp 98.7°F | Ht 73.0 in | Wt 225.0 lb

## 2016-02-25 DIAGNOSIS — R059 Cough, unspecified: Secondary | ICD-10-CM

## 2016-02-25 DIAGNOSIS — R05 Cough: Secondary | ICD-10-CM

## 2016-02-25 MED ORDER — LEVOFLOXACIN 500 MG PO TABS: 500.0000 mg | ORAL_TABLET | Freq: Every day | ORAL | Status: DC

## 2016-02-25 MED ORDER — FLUTICASONE FUROATE-VILANTEROL 200-25 MCG/INH IN AEPB: 1.0000 | INHALATION_SPRAY | Freq: Every day | RESPIRATORY_TRACT | Status: DC

## 2016-02-25 MED ORDER — HYDROCODONE-HOMATROPINE 5-1.5 MG/5ML PO SYRP: 5.0000 mL | ORAL_SOLUTION | Freq: Three times a day (TID) | ORAL | Status: DC | PRN

## 2016-02-25 NOTE — Progress Notes (Signed)
Primary Physician: Redge Gainer, MD  Chief Complaint: 65 year old gentleman with cough. He was seen on 02/14/2016 and treated with flu for Tamiflu but he continues to cough he's had no fever or chills. Cough is dry and nonproductive. He continues to work.  We talked about post respiratory syndrome and the cough associated with that but decided to go ahead and get a chest x-ray.     Past Medical History  Diagnosis Date  . Hyperlipidemia   . Peripheral neuropathy ([place])   . Arthritis     Cervical disc disease     Home Meds: Prior to Admission medications   Medication Sig Start Date End Date Taking? Authorizing Provider  b complex vitamins capsule Take 1 capsule by mouth daily.   Yes Historical Provider, MD  cholecalciferol (VITAMIN D) 1000 UNITS tablet Take 2,000 Units by mouth daily.   Yes Historical Provider, MD  Cyanocobalamin (VITAMIN B 12 PO) Take 1 tablet by mouth daily.   Yes Historical Provider, MD  gabapentin (NEURONTIN) 400 MG capsule Take 400 mg by mouth 2 (two) times daily.    Yes Historical Provider, MD  glucosamine-chondroitin 500-400 MG tablet Take 2 tablets by mouth 2 (two) times daily.    Yes Historical Provider, MD  Omega-3 Fatty Acids (FISH OIL) 1000 MG CAPS Take 2 capsules by mouth daily.   Yes Historical Provider, MD    Allergies: No Known Allergies  Social History   Social History  . Marital Status: Married    Spouse Name: N/A  . Number of Children: 2  . Years of Education: N/A   Occupational History  . Vet    Social History Main Topics  . Smoking status: Former Smoker    Quit date: 02/01/1990  . Smokeless tobacco: Former Systems developer  . Alcohol Use: No  . Drug Use: No  . Sexual Activity: Not on file   Other Topics Concern  . Not on file   Social History Narrative     Review of Systems: Constitutional: negative for chills, fever, night sweats, weight changes, or fatigue  HEENT: negative for vision changes, hearing loss, congestion, rhinorrhea, ST,  epistaxis, or sinus pressure Cardiovascular: negative for chest pain or palpitations Respiratory:  cough Abdominal: negative for abdominal pain, nausea, vomiting, diarrhea, or constipation Dermatological: negative for rash Neurologic: negative for headache, dizziness, or syncope All other systems reviewed and are otherwise negative with the exception to those above and in the HPI.   Physical Exam* There were no vitals taken for this visit., There is no weight on file to calculate BMI. General: Well developed, well nourished, in no acute distress. Head: Normocephalic, atraumatic, eyes without discharge, sclera non-icteric, nares are without discharge. Bilateral auditory canals clear, TM's are without perforation, pearly grey and translucent with reflective cone of light bilaterally. Oral cavity moist, posterior pharynx without exudate, erythema, peritonsillar abscess, or post nasal drip.  Neck: Supple. No thyromegaly. Full ROM. No lymphadenopathy. Lungs: Clear bilaterally to auscultation without wheezes, rales, or rhonchi. Breathing is unlabored. Heart: RRR with S1 S2. No murmurs, rubs, or gallops appreciated. Abdomen: Soft, non-tender, non-distended with normoactive bowel sounds. No hepatomegaly. No rebound/guarding. No obvious abdominal masses. Msk:  Strength and tone normal for age. Extremities/Skin: Warm and dry. No clubbing or cyanosis. No edema. No rashes or suspicious lesions. Neuro: Alert and oriented X 3. Moves all extremities spontaneously. Gait is normal. CNII-XII grossly in tact. Psych:  Responds to questions appropriately with a normal affect.   Labs: CXR: As read by radiologist  left lower lobe infiltrate   ASSESSMENT AND PLAN:  1. Cough Patient has left lower lobe pneumonia. A SPECT him by phone to inform them of radiologist diagnosis. Treat with Levaquin 500 milligrams daily 7 also Hycodan to take at bedtime for cough. Let us know if symptoms don't resolve. I did give him a  Brio sample but her phone conversation ask him not to use that at this time. - DG Chest 2 View; Future  Wardell Honour MD  02/25/2016 4:11 PM

## 2016-04-21 ENCOUNTER — Encounter: Payer: Self-pay | Admitting: *Deleted

## 2016-06-17 ENCOUNTER — Ambulatory Visit: Payer: BLUE CROSS/BLUE SHIELD | Admitting: Family Medicine

## 2016-06-18 ENCOUNTER — Ambulatory Visit (INDEPENDENT_AMBULATORY_CARE_PROVIDER_SITE_OTHER): Payer: BLUE CROSS/BLUE SHIELD

## 2016-06-18 ENCOUNTER — Ambulatory Visit (INDEPENDENT_AMBULATORY_CARE_PROVIDER_SITE_OTHER): Payer: BLUE CROSS/BLUE SHIELD | Admitting: Family Medicine

## 2016-06-18 ENCOUNTER — Encounter: Payer: Self-pay | Admitting: Family Medicine

## 2016-06-18 VITALS — BP 120/77 | HR 56 | Temp 97.3°F | Ht 73.0 in | Wt 216.0 lb

## 2016-06-18 DIAGNOSIS — R002 Palpitations: Secondary | ICD-10-CM

## 2016-06-18 DIAGNOSIS — M25532 Pain in left wrist: Secondary | ICD-10-CM

## 2016-06-18 DIAGNOSIS — M5090 Cervical disc disorder, unspecified, unspecified cervical region: Secondary | ICD-10-CM

## 2016-06-18 DIAGNOSIS — E785 Hyperlipidemia, unspecified: Secondary | ICD-10-CM

## 2016-06-18 DIAGNOSIS — M25531 Pain in right wrist: Secondary | ICD-10-CM

## 2016-06-18 DIAGNOSIS — N433 Hydrocele, unspecified: Secondary | ICD-10-CM

## 2016-06-18 DIAGNOSIS — Z0000 Encounter for general adult medical examination without abnormal findings: Secondary | ICD-10-CM

## 2016-06-18 DIAGNOSIS — E559 Vitamin D deficiency, unspecified: Secondary | ICD-10-CM

## 2016-06-18 DIAGNOSIS — N400 Enlarged prostate without lower urinary tract symptoms: Secondary | ICD-10-CM

## 2016-06-18 LAB — URINALYSIS, COMPLETE
BILIRUBIN UA: NEGATIVE
GLUCOSE, UA: NEGATIVE
KETONES UA: NEGATIVE
Leukocytes, UA: NEGATIVE
NITRITE UA: NEGATIVE
Protein, UA: NEGATIVE
RBC UA: NEGATIVE
SPEC GRAV UA: 1.025 (ref 1.005–1.030)
Urobilinogen, Ur: 0.2 mg/dL (ref 0.2–1.0)
pH, UA: 5.5 (ref 5.0–7.5)

## 2016-06-18 LAB — MICROSCOPIC EXAMINATION
BACTERIA UA: NONE SEEN
Epithelial Cells (non renal): NONE SEEN /hpf (ref 0–10)
RBC MICROSCOPIC, UA: NONE SEEN /HPF (ref 0–?)
WBC UA: NONE SEEN /HPF (ref 0–?)

## 2016-06-18 NOTE — Addendum Note (Signed)
Addended by: Zannie Cove on: 06/18/2016 09:38 AM   Modules accepted: Orders

## 2016-06-18 NOTE — Addendum Note (Signed)
Addended by: Zannie Cove on: 06/18/2016 09:17 AM   Modules accepted: Orders

## 2016-06-18 NOTE — Progress Notes (Signed)
Subjective:    Patient ID: Marcus Jensen, male    DOB: June 27, 1951, 64 y.o.   MRN: [id number]  HPI Patient is here today for annual wellness exam and follow up of chronic medical problems which includes hyperlipidemia. He is taking medications regularly.The patient's complaints today involve mostly the pain in his wrists and shoulders. Films of his neck with an MRI showed degenerative changes with foraminal disease and this was in 2013. He has before meals joint disease in the left shoulder from films in 2016. The patient takes Aleve after breakfast and supper for his neck and shoulder and wrist pain. He says the pain is tolerable at the present time. He will let me know if it worsens and we will consider sending him back to neurosurgery for further evaluation. He recently saw the cardiologist because of palpitations and the cardiologist did not do any further treatment but ask him to cut back on his caffeine and the patient did do this and he is having less problems with palpitations. He denies any trouble with swallowing heartburn indigestion nausea vomiting diarrhea blood in the stool or black tarry bowel movements. He does have nocturia 1. He also complains of may be a visual field defect and says that he needs to go back and see his ophthalmologist and I agreed with that.     Patient Active Problem List   Diagnosis Date Noted  . Palpitations 01/01/2016  . URI, acute 09/16/2015  . BPH (benign prostatic hyperplasia) 06/11/2015  . Hyperlipemia 02/01/2014  . Cholelithiasis 02/01/2014  . Cervical disc disease 02/01/2014  . Spinal stenosis of cervical region 02/01/2014  . Spermatocele 02/01/2014  . Hydrocele, right 02/01/2014  . Umbilical hernia 96/28/3662  . HEMORRHAGE OF RECTUM AND ANUS 11/11/2009  . HEARTBURN 11/11/2009   Outpatient Encounter Prescriptions as of 06/18/2016  Medication Sig  . b complex vitamins capsule Take 1 capsule by mouth daily.  . cholecalciferol (VITAMIN D) 1000  UNITS tablet Take 2,000 Units by mouth daily.  . Cyanocobalamin (VITAMIN B 12 PO) Take 1 tablet by mouth daily.  Marland Kitchen gabapentin (NEURONTIN) 400 MG capsule Take 400 mg by mouth 2 (two) times daily.   Marland Kitchen glucosamine-chondroitin 500-400 MG tablet Take 2 tablets by mouth 2 (two) times daily.   . Omega-3 Fatty Acids (FISH OIL) 1000 MG CAPS Take 2 capsules by mouth daily.  . [DISCONTINUED] fluticasone furoate-vilanterol (BREO ELLIPTA) 200-25 MCG/INH AEPB Inhale 1 puff into the lungs daily.  . [DISCONTINUED] HYDROcodone-homatropine (HYCODAN) 5-1.5 MG/5ML syrup Take 5 mLs by mouth every 8 (eight) hours as needed for cough.  . [DISCONTINUED] levofloxacin (LEVAQUIN) 500 MG tablet Take 1 tablet (500 mg total) by mouth daily.   No facility-administered encounter medications on file as of 06/18/2016.     Review of Systems  Constitutional: Negative.   HENT: Negative.   Eyes: Negative.   Respiratory: Negative.   Cardiovascular: Negative.   Gastrointestinal: Negative.   Endocrine: Negative.   Genitourinary: Negative.   Musculoskeletal: Positive for arthralgias (bilateral wrist and shoulders ).  Skin: Negative.   Allergic/Immunologic: Negative.   Neurological: Negative.   Hematological: Negative.   Psychiatric/Behavioral: Negative.        Objective:   Physical Exam  Constitutional: He is oriented to person, place, and time. He appears well-developed and well-nourished. No distress.  HENT:  Head: Normocephalic and atraumatic.  Right Ear: External ear normal.  Left Ear: External ear normal.  Mouth/Throat: Oropharynx is clear and moist. No oropharyngeal exudate.  Slight  nasal congestion on the left  Eyes: Conjunctivae and EOM are normal. Pupils are equal, round, and reactive to light. Right eye exhibits no discharge. Left eye exhibits no discharge. No scleral icterus.  Neck: Normal range of motion. Neck supple. No thyromegaly present.  No bruits or thyromegaly  Cardiovascular: Normal rate, regular  rhythm, normal heart sounds and intact distal pulses.   No murmur heard. Heart had a regular rate and rhythm at 60/m  Pulmonary/Chest: Effort normal and breath sounds normal. No respiratory distress. He has no wheezes. He has no rales. He exhibits no tenderness.  No axillary adenopathy Clear anteriorly and posteriorly  Abdominal: Soft. Bowel sounds are normal. He exhibits no mass. There is no tenderness. There is no rebound and no guarding.  No abdominal bruits organ enlargement or masses and no inguinal adenopathy  Genitourinary: Rectum normal and penis normal.  The prostate is enlarged but soft and smooth. There are no rectal masses. External genitalia were within normal limits. There were no inguinal hernias palpable.  Musculoskeletal: Normal range of motion. He exhibits no edema or tenderness.  There was good wrist motion bilaterally with no swelling apparently good pulses. I was unable to reproduce any pain in the neck with turning the head or with extension or flexion.  Lymphadenopathy:    He has no cervical adenopathy.  Neurological: He is alert and oriented to person, place, and time. He has normal reflexes. No cranial nerve deficit.  Skin: Skin is warm and dry. No rash noted.  Psychiatric: He has a normal mood and affect. His behavior is normal. Judgment and thought content normal.  Nursing note and vitals reviewed.   BP 120/77 mmHg  Pulse 56  Temp(Src) 97.3 F (36.3 C) (Oral)  Ht '6\' 1"'$  (1.854 m)  Wt 216 lb (97.977 kg)  BMI 28.50 kg/m2  [facility] reading (PRIMARY) by  Dr. Charlesetta Garibaldi wrist films with results pending                                      Assessment & Plan:  1. Hyperlipemia -The patient will continue with aggressive therapeutic lifestyle changes and his omega-3 fatty acids pending results of lab work - NMR, lipoprofile - CBC with Differential/Platelet - BMP8+EGFR - Hepatic function panel  2. BPH (benign prostatic hyperplasia) -The prostate remains soft  and smooth with no lumps or masses and he has nocturia 1 and we will continue to monitor this. - PSA, total and free - CBC with Differential/Platelet - Urinalysis, Complete  3. Vitamin D deficiency -Continue current treatment pending results of lab work - CBC with Differential/Platelet - VITAMIN D 25 Hydroxy (Vit-D Deficiency, Fractures)  4. Annual physical exam -The patient understands he needs to get an eye exam. -His next colonoscopy is not due until December 2020. - PSA, total and free - NMR, lipoprofile - CBC with Differential/Platelet - BMP8+EGFR - Hepatic function panel - VITAMIN D 25 Hydroxy (Vit-D Deficiency, Fractures) - Urinalysis, Complete  5. Pain in both wrists -Continue with Aleve twice daily after breakfast and supper - Arthritis Panel - CYCLIC CITRUL PEPTIDE ANTIBODY, IGG/IGA  6. Hydrocele, right -This is getting him no problems currently.  7. Cervical disc disease -Continue with Aleve twice daily and if symptoms get worse the patient will contact us and we will arrange for him to see neurosurgery.  8. Palpitations -Continue to limit caffeine intake as much as possible  Patient  Instructions  Continue current medications. Continue good therapeutic lifestyle changes which include good diet and exercise. Fall precautions discussed with patient. If an FOBT was given today- please return it to our front desk. If you are over 51 years old - you may need Prevnar 17 or the adult Pneumonia vaccine.   After your visit with [place] today you will receive a survey in the mail or online from [employer] regarding your care with [place]. Please take a moment to fill this out. Your feedback is very important to [place] as you can help [place] better understand your patient needs as well as improve your experience and satisfaction. WE CARE ABOUT YOU!!!   The patient should continue to follow his diet closely and get as much exercise as possible If he continues to have neck pain that  become severe and does not respond to the Aleve he knows to get back in touch with [place] so we can arrange for him to see the neurosurgeon again. He needs to schedule a visit with his eye doctor for an eye exam We will call with the results of the lab work as soon as these results become available He will be due for his next colonoscopy in December 2020.      Arrie Senate MD

## 2016-06-18 NOTE — Patient Instructions (Addendum)
Continue current medications. Continue good therapeutic lifestyle changes which include good diet and exercise. Fall precautions discussed with patient. If an FOBT was given today- please return it to our front desk. If you are over 63 years old - you may need Prevnar 77 or the adult Pneumonia vaccine.   After your visit with [place] today you will receive a survey in the mail or online from [employer] regarding your care with [place]. Please take a moment to fill this out. Your feedback is very important to [place] as you can help [place] better understand your patient needs as well as improve your experience and satisfaction. WE CARE ABOUT YOU!!!   The patient should continue to follow his diet closely and get as much exercise as possible If he continues to have neck pain that become severe and does not respond to the Aleve he knows to get back in touch with [place] so we can arrange for him to see the neurosurgeon again. He needs to schedule a visit with his eye doctor for an eye exam We will call with the results of the lab work as soon as these results become available He will be due for his next colonoscopy in December 2020.

## 2016-06-19 LAB — VITAMIN D 25 HYDROXY (VIT D DEFICIENCY, FRACTURES): VIT D 25 HYDROXY: 64.3 ng/mL (ref 30.0–100.0)

## 2016-06-19 LAB — NMR, LIPOPROFILE
CHOLESTEROL: 201 mg/dL — AB (ref 100–199)
HDL CHOLESTEROL BY NMR: 56 mg/dL (ref >39)
HDL PARTICLE NUMBER: 28.2 umol/L — AB (ref >=30.5)
LDL Particle Number: 1588 nmol/L — ABNORMAL HIGH (ref <1000)
LDL Size: 21.7 nm (ref >20.5)
LDL-C: 135 mg/dL — ABNORMAL HIGH (ref 0–99)
LP-IR Score: <25 (ref <=45)
SMALL LDL PARTICLE NUMBER: 366 nmol/L (ref <=527)
TRIGLYCERIDES BY NMR: 50 mg/dL (ref 0–149)

## 2016-06-19 LAB — HEPATIC FUNCTION PANEL
ALBUMIN: 4.3 g/dL (ref 3.6–4.8)
ALT: 17 IU/L (ref 0–44)
AST: 29 IU/L (ref 0–40)
Alkaline Phosphatase: 70 IU/L (ref 39–117)
Bilirubin Total: 0.8 mg/dL (ref 0.0–1.2)
Bilirubin, Direct: 0.19 mg/dL (ref 0.00–0.40)
TOTAL PROTEIN: 6.7 g/dL (ref 6.0–8.5)

## 2016-06-19 LAB — ARTHRITIS PANEL
BASOS ABS: 0 10*3/uL (ref 0.0–0.2)
Basos: 1 %
EOS (ABSOLUTE): 0.1 10*3/uL (ref 0.0–0.4)
EOS: 3 %
Hematocrit: 41.5 % (ref 37.5–51.0)
Hemoglobin: 13.9 g/dL (ref 12.6–17.7)
Immature Grans (Abs): 0 10*3/uL (ref 0.0–0.1)
Immature Granulocytes: 0 %
LYMPHS ABS: 0.9 10*3/uL (ref 0.7–3.1)
Lymphs: 26 %
MCH: 30.1 pg (ref 26.6–33.0)
MCHC: 33.5 g/dL (ref 31.5–35.7)
MCV: 90 fL (ref 79–97)
MONOS ABS: 0.3 10*3/uL (ref 0.1–0.9)
Monocytes: 10 %
NEUTROS ABS: 2.1 10*3/uL (ref 1.4–7.0)
Neutrophils: 60 %
PLATELETS: 187 10*3/uL (ref 150–379)
RBC: 4.62 x10E6/uL (ref 4.14–5.80)
RDW: 13.4 % (ref 12.3–15.4)
Rhuematoid fact SerPl-aCnc: <10 IU/mL (ref 0.0–13.9)
SED RATE: 3 mm/h (ref 0–30)
Uric Acid: 5.3 mg/dL (ref 3.7–8.6)
WBC: 3.5 10*3/uL (ref 3.4–10.8)

## 2016-06-19 LAB — BMP8+EGFR
BUN/Creatinine Ratio: 20 (ref 10–24)
BUN: 20 mg/dL (ref 8–27)
CHLORIDE: 99 mmol/L (ref 96–106)
CO2: 23 mmol/L (ref 18–29)
Calcium: 9.4 mg/dL (ref 8.6–10.2)
Creatinine, Ser: 1.01 mg/dL (ref 0.76–1.27)
GFR calc Af Amer: 91 mL/min/{1.73_m2} (ref >59)
GFR calc non Af Amer: 79 mL/min/{1.73_m2} (ref >59)
GLUCOSE: 87 mg/dL (ref 65–99)
POTASSIUM: 4.2 mmol/L (ref 3.5–5.2)
SODIUM: 139 mmol/L (ref 134–144)

## 2016-06-19 LAB — PSA, TOTAL AND FREE
PSA, Free Pct: 30.5 %
PSA, Free: 0.61 ng/mL
Prostate Specific Ag, Serum: 2 ng/mL (ref 0.0–4.0)

## 2016-06-19 LAB — CYCLIC CITRUL PEPTIDE ANTIBODY, IGG/IGA: Cyclic Citrullin Peptide Ab: 5 units (ref 0–19)

## 2016-07-27 ENCOUNTER — Ambulatory Visit (INDEPENDENT_AMBULATORY_CARE_PROVIDER_SITE_OTHER): Payer: BLUE CROSS/BLUE SHIELD | Admitting: Family Medicine

## 2016-07-27 ENCOUNTER — Encounter: Payer: Self-pay | Admitting: Family Medicine

## 2016-07-27 VITALS — BP 121/73 | HR 60 | Temp 97.5°F | Ht 73.0 in | Wt 217.4 lb

## 2016-07-27 DIAGNOSIS — K4090 Unilateral inguinal hernia, without obstruction or gangrene, not specified as recurrent: Secondary | ICD-10-CM

## 2016-07-27 NOTE — Progress Notes (Signed)
   HPI  Patient presents today here with concern for right-sided inguinal hernia.  Patient is a Magazine features editor for 40 years. He has an last 2-3 weeks developed a right-sided bulge that goes in and out. It is reducible when it comes out. He has discomfort when it is out, however and has no problems with it when it back in.  He has a symptomatic umbilical hernia.  He denies any fever, chills, sweats, or persistent groin pain.  It first presented after a few hours of forceful rowing in a boat. It hurts worse with exertion. 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 On my exam today I do not appreciate any bulge or hernia, however I do think that it's very reasonable to refer him for evaluation by general surgery. Refer Gen. surgery Return to clinic with any concerns He understands easily signs and symptoms of incarceration and the appropriate time to seek emergency medical care if that's needed.    Orders Placed This Encounter  Procedures  . Ambulatory referral to General Surgery    Referral Priority:   Routine    Referral Type:   Surgical    Referral Reason:   Specialty Services Required    Requested Specialty:   General Surgery    Number of Visits Requested:   [place], MD [facility] 07/27/2016, 4:23 PM

## 2016-07-27 NOTE — Patient Instructions (Signed)
Great to see you!  We will work on a referral for you, please let me know if you need anything.

## 2016-08-17 NOTE — Patient Instructions (Addendum)
Marcus Jensen  08/17/2016     @PREFPERIOPPHARMACY @   Your procedure is scheduled on 08/21/2016.  Report to [facility] at 6:15 A.M.  Call this number if you have problems the morning of surgery:  [phone]   Remember:  Do not eat food or drink liquids after midnight.  Take these medicines the morning of surgery with A SIP OF WATER : NEURONTIN   Do not wear jewelry, make-up or nail polish.  Do not wear lotions, powders, or perfumes, or deoderant.  Do not shave 48 hours prior to surgery.  Men may shave face and neck.  Do not bring valuables to the hospital.  [facility] is not responsible for any belongings or valuables.  Contacts, dentures or bridgework may not be worn into surgery.  Leave your suitcase in the car.  After surgery it may be brought to your room.  For patients admitted to the hospital, discharge time will be determined by your treatment team.  Patients discharged the day of surgery will not be allowed to drive home.   Name and phone number of your driver:   FAMILY Special instructions:  N/A  Please read over the following fact sheets that you were given. Care and Recovery After Surgery    Open Hernia Repair Open hernia repair is surgery to fix a hernia. A hernia occurs when an internal organ or tissue pushes out through a weak spot in the abdominal wall muscles. Hernias commonly occur in the groin and around the navel. Most hernias tend to get worse over time. Surgery is often done to prevent the hernia from getting bigger, becoming uncomfortable, or becoming an emergency. Emergency surgery may be needed if abdominal contents get stuck in the opening (incarcerated hernia) or the blood supply gets cut off (strangulated hernia). In an open repair, a large cut (incision) is made in the abdomen to perform the surgery. LET [facility] CARE PROVIDER KNOW ABOUT:  Any allergies you have.  All medicines you are taking, including vitamins, herbs, eye drops, creams,  and over-the-counter medicines.  Previous problems you or members of your family have had with the use of anesthetics.  Any blood disorders you have.  Previous surgeries you have had.  Medical conditions you have. RISKS AND COMPLICATIONS Generally, this is a safe procedure. However, as with any procedure, complications can occur. Possible complications include:  Infection.  Bleeding.  Nerve injury.  Chronic pain.  The hernia can come back.  Injury to the intestines. BEFORE THE PROCEDURE  Ask your health care provider about changing or stopping any regular medicines. Avoid taking aspirin or blood thinners as directed by your health care provider.  Do noteat or drink anything after midnight the night before surgery.  If you smoke, do not smoke for at least 2 weeks before your surgery.  Do not drink alcohol the day before your surgery.  Let your health care provider know if you develop a cold or any infection before your surgery.  Arrange for someone to drive you home after the procedure or after your hospital stay. Also arrange for someone to help you with activities during recovery. PROCEDURE   Small monitors will be put on your body. They are used to check your heart, blood pressure, and oxygen level.   An IV access tube will be put into one of your veins. Medicine will be able to flow directly into your body through this IV tube.   You might be given a medicine to  help you relax (sedative).   You will be given a medicine to make you sleep (general anesthetic). A breathing tube may be placed into your lungs during the procedure.  A cut (incision) is made over the hernia defect, and the contents are pushed back into the abdomen.  If the hernia is small, stitches may be used to bring the muscle edges back together.  Typically, a surgeon will place a mesh patch made of man-made material (synthetic) to cover the defect. The mesh is sewn to healthy muscle. This reduces  the risk of the hernia coming back.  The tissue and skin over the hernia are then closed with stitches or staples.  If the hernia was large, a drain may be left in place to collect excess fluid where the hernia used to be.  Bandages (dressings) are used to cover the incision. AFTER THE PROCEDURE  You will be taken to a recovery area where your progress will be monitored.  If the hernia was small or in the groin (inguinal) region, you will likely be allowed to go home once you are awake, stable, and taking fluids well.  If the hernia was large, you may have to wait for your bowel function to return. You may need to stay in the hospital for 2-3 days until you can eat and your pain is controlled. A drain may be left in place for 5-7 days. You will be taught how to care for the drain.   This information is not intended to replace advice given to you by your health care provider. Make sure you discuss any questions you have with your health care provider.   Document Released: 06/02/2001 Document Revised: 09/27/2013 Document Reviewed: 07/19/2013 Elsevier Interactive Patient Education [employer].

## 2016-08-18 NOTE — H&P (Signed)
NTS SOAP Note  Vital Signs:  Vitals as of: 0000000: Systolic [id number]: Diastolic 73: Heart Rate 64: Temp 97.67F (Temporal): Height 58ft 1in: Weight 219Lbs 0 Ounces: Pain Level 1: BMI 28.89   BMI : 28.89 kg/m2  Subjective: This 65 year old male presents for of a right inguinal hernia.  Started having swelling and pain while rowing at the beach, since that time the pain is only intermittent in nature.  Made worse with straining, resolves with lying down.  Does not need to take anything for it.  No nausea, vomiting.  Not affecting daily lifestyle at this time.  Review of Symptoms:  some numbness Head:negative Eyes:negative Nose/Mouth/Throat:negative Cardiovascular:negative Respiratory:negative Gastrointestinnegative Genitourinary:negative neck pain Skin:negative Hematolgic/Lymphatic:negative Allergic/Immunologic:negative   Past Medical History:Reviewed  Past Medical History  Surgical History: knee surgery Medical Problems: none Allergies: nkda Medications: gabapentin, fish oil   Social History:Reviewed  Social History  Preferred Language: English Race:  White Ethnicity: Not Hispanic / Latino Age: 33 year Marital Status:  M Alcohol: socially   Smoking Status: Never smoker reviewed on 08/04/2016 Functional Status reviewed on 08/04/2016 ------------------------------------------------ Bathing: Normal Cooking: Normal Dressing: Normal Driving: Normal Eating: Normal Managing Meds: Normal Oral Care: Normal Shopping: Normal Toileting: Normal Transferring: Normal Walking: Normal Cognitive Status reviewed on 08/04/2016 ------------------------------------------------ Attention: Normal Decision Making: Normal Language: Normal Memory: Normal Motor: Normal Perception: Normal Problem Solving: Normal Visual and Spatial: Normal   Family History:Reviewed  Family Health History Mother, Deceased; Stroke (CVA);  Father, Deceased; Stroke (CVA);      Objective Information: General:Well appearing, well nourished in no distress. Head:Atraumatic; no masses; no abnormalities Neck:Supple without lymphadenopathy.  Heart:RRR, no murmur or gallop.  Normal S1, S2.  No S3, S4.  Lungs:CTA bilaterally, no wheezes, rhonchi, rales.  Breathing unlabored. Abdomen:Soft, NT/ND, normal bowel sounds, no HSM, no masses.  No peritoneal signs.  Easily reducible right inguinal hernia, no left inguinal hernia. [id number]  Assessment:Right inguinal hernia  Diagnoses: 550.90  K40.90 Unilateral or unspecified inguinal hernia, without mention of obstruction or gangrene (not specified as recurrent)  Procedures: [id number] - OFFICE OUTPATIENT NEW 30 MINUTES    Plan:  Scheduled for right inguinal herniorrhaphy with mesh on 08/21/16.  Risks and benefits of procedure including bleeding, infection, mesh use, and the possibility of an open procedure were fully explained to the patient, who gives informed consent.    Follow-up:PRN

## 2016-08-19 ENCOUNTER — Other Ambulatory Visit: Payer: Self-pay | Admitting: Obstetrics and Gynecology

## 2016-08-19 ENCOUNTER — Encounter (HOSPITAL_COMMUNITY): Payer: Self-pay

## 2016-08-19 ENCOUNTER — Encounter (HOSPITAL_COMMUNITY)
Admission: RE | Admit: 2016-08-19 | Discharge: 2016-08-19 | Disposition: A | Discharge status: Home / Self Care | Payer: BLUE CROSS/BLUE SHIELD | Source: Ambulatory Visit | Attending: General Surgery | Admitting: General Surgery

## 2016-08-19 DIAGNOSIS — Z87891 Personal history of nicotine dependence: Secondary | ICD-10-CM | POA: Diagnosis not present

## 2016-08-19 DIAGNOSIS — K4090 Unilateral inguinal hernia, without obstruction or gangrene, not specified as recurrent: Secondary | ICD-10-CM | POA: Diagnosis not present

## 2016-08-19 DIAGNOSIS — Z79899 Other long term (current) drug therapy: Secondary | ICD-10-CM | POA: Diagnosis not present

## 2016-08-19 HISTORY — DX: Other specified postprocedural states: R11.2

## 2016-08-19 HISTORY — DX: Other specified postprocedural states: Z98.890

## 2016-08-19 LAB — CBC WITH DIFFERENTIAL/PLATELET
BASOS ABS: 0 10*3/uL (ref 0.0–0.1)
BASOS PCT: 1 %
Eosinophils Absolute: 0.1 10*3/uL (ref 0.0–0.7)
Eosinophils Relative: 3 %
HEMATOCRIT: 40.2 % (ref 39.0–52.0)
Hemoglobin: 13.7 g/dL (ref 13.0–17.0)
Lymphocytes Relative: 28 %
Lymphs Abs: 1.1 10*3/uL (ref 0.7–4.0)
MCH: 30.9 pg (ref 26.0–34.0)
MCHC: 34.1 g/dL (ref 30.0–36.0)
MCV: 90.5 fL (ref 78.0–100.0)
MONO ABS: 0.4 10*3/uL (ref 0.1–1.0)
Monocytes Relative: 10 %
NEUTROS ABS: 2.3 10*3/uL (ref 1.7–7.7)
NEUTROS PCT: 58 %
Platelets: 178 10*3/uL (ref 150–400)
RBC: 4.44 MIL/uL (ref 4.22–5.81)
RDW: 12.1 % (ref 11.5–15.5)
WBC: 4 10*3/uL (ref 4.0–10.5)

## 2016-08-19 LAB — BASIC METABOLIC PANEL
ANION GAP: 8 (ref 5–15)
BUN: 20 mg/dL (ref 6–20)
CALCIUM: 9.3 mg/dL (ref 8.9–10.3)
CO2: 26 mmol/L (ref 22–32)
Chloride: 104 mmol/L (ref 101–111)
Creatinine, Ser: 0.92 mg/dL (ref 0.61–1.24)
Glucose, Bld: 96 mg/dL (ref 65–99)
Potassium: 4.2 mmol/L (ref 3.5–5.1)
SODIUM: 138 mmol/L (ref 135–145)

## 2016-08-19 NOTE — Pre-Procedure Instructions (Signed)
Patient given instructions to sign up for my chart at home. 

## 2016-08-21 ENCOUNTER — Ambulatory Visit (HOSPITAL_COMMUNITY): Payer: BLUE CROSS/BLUE SHIELD | Admitting: Anesthesiology

## 2016-08-21 ENCOUNTER — Ambulatory Visit (HOSPITAL_COMMUNITY)
Admission: RE | Admit: 2016-08-21 | Discharge: 2016-08-21 | Disposition: A | Discharge status: Home / Self Care | Payer: BLUE CROSS/BLUE SHIELD | Source: Ambulatory Visit | Attending: General Surgery | Admitting: General Surgery

## 2016-08-21 ENCOUNTER — Encounter (HOSPITAL_COMMUNITY)
Admission: RE | Disposition: A | Discharge status: Home / Self Care | Payer: Self-pay | Source: Ambulatory Visit | Attending: General Surgery

## 2016-08-21 ENCOUNTER — Encounter (HOSPITAL_COMMUNITY): Payer: Self-pay | Admitting: *Deleted

## 2016-08-21 DIAGNOSIS — Z79899 Other long term (current) drug therapy: Secondary | ICD-10-CM | POA: Insufficient documentation

## 2016-08-21 DIAGNOSIS — Z87891 Personal history of nicotine dependence: Secondary | ICD-10-CM | POA: Insufficient documentation

## 2016-08-21 DIAGNOSIS — K4090 Unilateral inguinal hernia, without obstruction or gangrene, not specified as recurrent: Secondary | ICD-10-CM | POA: Insufficient documentation

## 2016-08-21 HISTORY — PX: INGUINAL HERNIA REPAIR: SHX194

## 2016-08-21 SURGERY — REPAIR, HERNIA, INGUINAL, ADULT
Anesthesia: General | Site: Inguinal | Laterality: Right

## 2016-08-21 MED ORDER — ONDANSETRON HCL 4 MG/2ML IJ SOLN
INTRAMUSCULAR | Status: AC
  Filled 2016-08-21: qty 2

## 2016-08-21 MED ORDER — GLYCOPYRROLATE 0.2 MG/ML IJ SOLN
INTRAMUSCULAR | Status: AC
  Filled 2016-08-21: qty 3

## 2016-08-21 MED ORDER — EPHEDRINE SULFATE 50 MG/ML IJ SOLN
INTRAMUSCULAR | Status: DC | PRN
  Administered 2016-08-21: 10 mg via INTRAVENOUS

## 2016-08-21 MED ORDER — SODIUM CHLORIDE 0.9 % IJ SOLN
INTRAMUSCULAR | Status: AC
  Filled 2016-08-21: qty 10

## 2016-08-21 MED ORDER — PROPOFOL 10 MG/ML IV BOLUS
INTRAVENOUS | Status: AC
  Filled 2016-08-21: qty 40

## 2016-08-21 MED ORDER — DEXAMETHASONE SODIUM PHOSPHATE 4 MG/ML IJ SOLN
INTRAMUSCULAR | Status: AC
  Filled 2016-08-21: qty 1

## 2016-08-21 MED ORDER — CEFAZOLIN SODIUM-DEXTROSE 2-4 GM/100ML-% IV SOLN
INTRAVENOUS | Status: AC
  Filled 2016-08-21: qty 100

## 2016-08-21 MED ORDER — BUPIVACAINE LIPOSOME 1.3 % IJ SUSP
INTRAMUSCULAR | Status: AC
  Filled 2016-08-21: qty 20

## 2016-08-21 MED ORDER — FENTANYL CITRATE (PF) 100 MCG/2ML IJ SOLN
INTRAMUSCULAR | Status: AC
  Filled 2016-08-21: qty 2

## 2016-08-21 MED ORDER — MIDAZOLAM HCL 2 MG/2ML IJ SOLN
INTRAMUSCULAR | Status: AC
  Filled 2016-08-21: qty 2

## 2016-08-21 MED ORDER — ONDANSETRON HCL 4 MG/2ML IJ SOLN
4.0000 mg | Freq: Once | INTRAMUSCULAR | Status: AC
  Administered 2016-08-21: 4 mg via INTRAVENOUS

## 2016-08-21 MED ORDER — EPHEDRINE SULFATE 50 MG/ML IJ SOLN
INTRAMUSCULAR | Status: AC
  Filled 2016-08-21: qty 1

## 2016-08-21 MED ORDER — SODIUM CHLORIDE 0.9 % IR SOLN
Status: DC | PRN
  Administered 2016-08-21: 1000 mL

## 2016-08-21 MED ORDER — PROPOFOL 10 MG/ML IV BOLUS
INTRAVENOUS | Status: DC | PRN
  Administered 2016-08-21: 200 mg via INTRAVENOUS

## 2016-08-21 MED ORDER — KETOROLAC TROMETHAMINE 30 MG/ML IJ SOLN
INTRAMUSCULAR | Status: AC
Start: 2016-08-21 — End: 2016-08-21
  Filled 2016-08-21: qty 1

## 2016-08-21 MED ORDER — CEFAZOLIN SODIUM-DEXTROSE 2-4 GM/100ML-% IV SOLN
2.0000 g | INTRAVENOUS | Status: AC
  Administered 2016-08-21: 2 g via INTRAVENOUS

## 2016-08-21 MED ORDER — MIDAZOLAM HCL 2 MG/2ML IJ SOLN
1.0000 mg | INTRAMUSCULAR | Status: DC | PRN
  Administered 2016-08-21: 2 mg via INTRAVENOUS

## 2016-08-21 MED ORDER — LIDOCAINE HCL (PF) 1 % IJ SOLN
INTRAMUSCULAR | Status: AC
  Filled 2016-08-21: qty 5

## 2016-08-21 MED ORDER — LIDOCAINE HCL (CARDIAC) 10 MG/ML IV SOLN
INTRAVENOUS | Status: DC | PRN
  Administered 2016-08-21: 50 mg via INTRAVENOUS

## 2016-08-21 MED ORDER — CHLORHEXIDINE GLUCONATE CLOTH 2 % EX PADS: 6.0000 | MEDICATED_PAD | Freq: Once | CUTANEOUS | Status: DC

## 2016-08-21 MED ORDER — NEOSTIGMINE METHYLSULFATE 10 MG/10ML IV SOLN
INTRAVENOUS | Status: AC
  Filled 2016-08-21: qty 1

## 2016-08-21 MED ORDER — KETOROLAC TROMETHAMINE 30 MG/ML IJ SOLN
30.0000 mg | Freq: Once | INTRAMUSCULAR | Status: AC
  Administered 2016-08-21: 30 mg via INTRAVENOUS

## 2016-08-21 MED ORDER — HYDROMORPHONE HCL 1 MG/ML IJ SOLN
0.2500 mg | INTRAMUSCULAR | Status: DC | PRN
  Administered 2016-08-21: 0.25 mg via INTRAVENOUS
  Filled 2016-08-21: qty 1

## 2016-08-21 MED ORDER — OXYCODONE-ACETAMINOPHEN 7.5-325 MG PO TABS: 1.0000 | ORAL_TABLET | ORAL | 0 refills | Status: DC | PRN

## 2016-08-21 MED ORDER — LACTATED RINGERS IV SOLN
INTRAVENOUS | Status: DC
  Administered 2016-08-21: 1000 mL via INTRAVENOUS

## 2016-08-21 MED ORDER — FENTANYL CITRATE (PF) 100 MCG/2ML IJ SOLN
INTRAMUSCULAR | Status: DC | PRN
  Administered 2016-08-21: 25 ug via INTRAVENOUS
  Administered 2016-08-21: 50 ug via INTRAVENOUS

## 2016-08-21 MED ORDER — SCOPOLAMINE 1 MG/3DAYS TD PT72
MEDICATED_PATCH | TRANSDERMAL | Status: AC
Start: 2016-08-21 — End: 2016-08-21
  Filled 2016-08-21: qty 1

## 2016-08-21 MED ORDER — BUPIVACAINE LIPOSOME 1.3 % IJ SUSP
INTRAMUSCULAR | Status: DC | PRN
  Administered 2016-08-21: 20 mL

## 2016-08-21 MED ORDER — DEXAMETHASONE SODIUM PHOSPHATE 4 MG/ML IJ SOLN
4.0000 mg | INTRAMUSCULAR | Status: AC
  Administered 2016-08-21: 4 mg via INTRAVENOUS

## 2016-08-21 MED ORDER — GLYCOPYRROLATE 0.2 MG/ML IJ SOLN
INTRAMUSCULAR | Status: AC
  Filled 2016-08-21: qty 1

## 2016-08-21 MED ORDER — FENTANYL CITRATE (PF) 250 MCG/5ML IJ SOLN
INTRAMUSCULAR | Status: AC
  Filled 2016-08-21: qty 5

## 2016-08-21 MED ORDER — FENTANYL CITRATE (PF) 100 MCG/2ML IJ SOLN
25.0000 ug | INTRAMUSCULAR | Status: DC | PRN
  Administered 2016-08-21: 25 ug via INTRAVENOUS

## 2016-08-21 MED ORDER — SCOPOLAMINE 1 MG/3DAYS TD PT72
1.0000 | MEDICATED_PATCH | TRANSDERMAL | Status: DC
  Administered 2016-08-21: 1.5 mg via TRANSDERMAL

## 2016-08-21 SURGICAL SUPPLY — 38 items
BAG HAMPER (MISCELLANEOUS) ×3 IMPLANT
CLOTH BEACON ORANGE TIMEOUT ST (SAFETY) ×3 IMPLANT
COVER LIGHT HANDLE STERIS (MISCELLANEOUS) ×6 IMPLANT
DERMABOND ADVANCED (GAUZE/BANDAGES/DRESSINGS) ×2
DERMABOND ADVANCED .7 DNX12 (GAUZE/BANDAGES/DRESSINGS) ×1 IMPLANT
DRAIN PENROSE 18X1/2 LTX STRL (DRAIN) ×3 IMPLANT
ELECT REM PT RETURN 9FT ADLT (ELECTROSURGICAL) ×3
ELECTRODE REM PT RTRN 9FT ADLT (ELECTROSURGICAL) ×1 IMPLANT
GLOVE BIOGEL M 7.0 STRL (GLOVE) ×3 IMPLANT
GLOVE BIOGEL PI IND STRL 7.0 (GLOVE) ×2 IMPLANT
GLOVE BIOGEL PI INDICATOR 7.0 (GLOVE) ×4
GLOVE ECLIPSE 6.5 STRL STRAW (GLOVE) ×3 IMPLANT
GLOVE EXAM NITRILE PF MED BLUE (GLOVE) ×3 IMPLANT
GLOVE SURG SS PI 7.5 STRL IVOR (GLOVE) ×3 IMPLANT
GOWN STRL REUS W/ TWL XL LVL3 (GOWN DISPOSABLE) ×1 IMPLANT
GOWN STRL REUS W/TWL LRG LVL3 (GOWN DISPOSABLE) ×6 IMPLANT
GOWN STRL REUS W/TWL XL LVL3 (GOWN DISPOSABLE) ×2
INST SET MINOR GENERAL (KITS) ×3 IMPLANT
KIT ROOM TURNOVER APOR (KITS) ×3 IMPLANT
MANIFOLD NEPTUNE II (INSTRUMENTS) ×3 IMPLANT
MESH MARLEX PLUG MEDIUM (Mesh General) ×3 IMPLANT
NEEDLE HYPO 21X1.5 SAFETY (NEEDLE) ×3 IMPLANT
NS IRRIG 1000ML POUR BTL (IV SOLUTION) ×3 IMPLANT
PACK MINOR (CUSTOM PROCEDURE TRAY) ×3 IMPLANT
PAD ARMBOARD 7.5X6 YLW CONV (MISCELLANEOUS) ×3 IMPLANT
SCRUB PCMX 4 OZ (MISCELLANEOUS) ×3 IMPLANT
SET BASIN LINEN APH (SET/KITS/TRAYS/PACK) ×3 IMPLANT
SUT NOVA NAB GS-22 2 2-0 T-19 (SUTURE) ×6 IMPLANT
SUT PROLENE 2 0 SH 30 (SUTURE) IMPLANT
SUT SILK 3 0 (SUTURE)
SUT SILK 3-0 18XBRD TIE 12 (SUTURE) IMPLANT
SUT VIC AB 2-0 CT1 27 (SUTURE) ×2
SUT VIC AB 2-0 CT1 TAPERPNT 27 (SUTURE) ×1 IMPLANT
SUT VIC AB 3-0 SH 27 (SUTURE) ×2
SUT VIC AB 3-0 SH 27X BRD (SUTURE) ×1 IMPLANT
SUT VIC AB 4-0 PS2 27 (SUTURE) ×3 IMPLANT
SUT VICRYL AB 3 0 TIES (SUTURE) IMPLANT
SYR 20CC LL (SYRINGE) ×3 IMPLANT

## 2016-08-21 NOTE — Discharge Instructions (Signed)
Follow up with Dr. Arnoldo Morale on September 12 at 9:45  Open Hernia Repair, Care After Refer to this sheet in the next few weeks. These instructions provide you with information on caring for yourself after your procedure. Your health care provider may also give you more specific instructions. Your treatment has been planned according to current medical practices, but problems sometimes occur. Call your health care provider if you have any problems or questions after your procedure. WHAT TO EXPECT AFTER THE PROCEDURE After your procedure, it is typical to have the following:  Pain in your abdomen, especially along your incision. You will be given pain medicines to control the pain.  Constipation. You may be given a stool softener to help prevent this. HOME CARE INSTRUCTIONS  Only take over-the-counter or prescription medicines as directed by your health care provider.  Keep the incision area dry and clean. You may wash the incision area gently with soap and water 48 hours after surgery. Gently blot or dab the incision area dry. Do not take baths, use swimming pools, or use hot tubs for 10 days or until your health care provider approves.  Change bandages (dressings) as directed by your health care provider.  Continue your normal diet as directed by your health care provider. Eat plenty of fruits and vegetables to help prevent constipation.  Drink enough fluids to keep your urine clear or pale yellow. This also helps prevent constipation.  Do not drive until your health care provider says it is okay.  Do not lift anything heavier than 10 lb (4.5 kg) or play contact sports for 4 weeks or until your health care provider approves.  Follow up with your health care provider as directed. Ask your health care provider when to make an appointment to have your stitches (sutures) or staples removed. SEEK MEDICAL CARE IF:  You have increased bleeding coming from the incision site.  You have blood in  your stool.  You have increasing pain in the incision area.  You see redness or swelling in the incision area.  You have fluid (pus) coming from the incision.  You have a fever.  You notice a bad smell coming from the incision area or dressing. SEEK IMMEDIATE MEDICAL CARE IF:  You develop a rash.  You have chest pain or shortness of breath.  You feel lightheaded or feel faint.   This information is not intended to replace advice given to you by your health care provider. Make sure you discuss any questions you have with your health care provider.   Document Released: 06/26/2005 Document Revised: 12/28/2014 Document Reviewed: 07/19/2013 Elsevier Interactive Patient Education 2016 Elsevier Inc.  PATIENT INSTRUCTIONS POST-ANESTHESIA  IMMEDIATELY FOLLOWING SURGERY:  Do not drive or operate machinery for the first twenty four hours after surgery.  Do not make any important decisions for twenty four hours after surgery or while taking narcotic pain medications or sedatives.  If you develop intractable nausea and vomiting or a severe headache please notify your doctor immediately.  FOLLOW-UP:  Please make an appointment with your surgeon as instructed. You do not need to follow up with anesthesia unless specifically instructed to do so.  WOUND CARE INSTRUCTIONS (if applicable):  Keep a dry clean dressing on the anesthesia/puncture wound site if there is drainage.  Once the wound has quit draining you may leave it open to air.  Generally you should leave the bandage intact for twenty four hours unless there is drainage.  If the epidural site drains for  more than 36-48 hours please call the anesthesia department.  QUESTIONS?:  Please feel free to call your physician or the hospital operator if you have any questions, and they will be happy to assist you.

## 2016-08-21 NOTE — Anesthesia Postprocedure Evaluation (Signed)
Anesthesia Post Note  Patient: Marcus Jensen  Procedure(s) Performed: Procedure(s) (LRB): RIGHT INGUINAL HERNIORRHAPHY WITH MESH (Right)  Patient location during evaluation: PACU Anesthesia Type: General Level of consciousness: awake and alert Pain management: pain level controlled Vital Signs Assessment: post-procedure vital signs reviewed and stable Respiratory status: spontaneous breathing Cardiovascular status: stable Anesthetic complications: no    Last Vitals:  Vitals:   08/21/16 0845 08/21/16 0900  BP: 118/76 124/86  Pulse: (!) 59 (!) 55  Resp: (!) 8 (!) 8  Temp:      Last Pain:  Vitals:   08/21/16 0900  TempSrc:   PainSc: 2                  Crytal Pensinger

## 2016-08-21 NOTE — Op Note (Signed)
Patient:  Marcus Jensen  DOB:  Feb 16, 1951  MRN:  [id number]   Preop Diagnosis:  Right inguinal hernia  Postop Diagnosis:  Same  Procedure:  Right inguinal herniorrhaphy with mesh  Surgeon:  Aviva Signs, M.D.  Anes:  Gen. endotracheal  Indications:  Patient is a 65 year old white male who presents with a symptomatic right inguinal hernia. The risks and benefits of the procedure including bleeding, infection, mesh use, and the possibility of recurrence of the hernia were fully explained to the patient, who gave informed consent.  Procedure note:  The patient was placed the supine position. After induction of general endotracheal anesthesia, the right groin region was prepped and draped using usual sterile technique with CHG. Surgical site confirmation was performed.  An incision was made in the right groin region down to the external oblique aponeuroses. The aponeuroses was incised to the external ring. A Penrose drain was placed around the spermatic cord. The vas deferens was noted within the spermatic cord. The ilioinguinal nerve was identified and retracted inferiorly from the operative field. An indirect hernia sac was found. This was freed away from the spermatic cord up to the peritoneal reflection and inverted. A medium size Bard PerFix plug was then inserted. An onlay patch was then placed along the floor of inguinal canal and secured superiorly to the conjoined tendon and inferiorly to the shelving edge of Poupart's ligament using 2-0 Novafil interrupted sutures. The internal ring was recreated using a 2-0 Novafil interrupted suture. The external oblique aponeuroses was reapproximated using a 2-0 Vicryl running suture. The subcutaneous layer was reapproximated using 3-0 Vicryl interrupted suture. Skin was closed using a 4-0 Vicryl subcuticular suture. Exparel was instilled into the surrounding wound. Dermabond was then applied.  All tape and needle counts were correct at the end of the  procedure. Patient was extubated in the operating room and transferred to PACU in stable condition.  Complications:  None  EBL:  Minimal  Specimen:  None

## 2016-08-21 NOTE — Anesthesia Preprocedure Evaluation (Signed)
Anesthesia Evaluation  Patient identified by MRN, date of birth, ID band Patient awake    Reviewed: Allergy & Precautions, NPO status , Patient's Chart, lab work & pertinent test results  History of Anesthesia Complications (+) PONV and history of anesthetic complications  Airway Mallampati: I  TM Distance: >3 FB     Dental  (+) Teeth Intact, Implants   Pulmonary neg pulmonary ROS, former smoker,    breath sounds clear to auscultation       Cardiovascular negative cardio ROS   Rhythm:Regular Rate:Normal     Neuro/Psych    GI/Hepatic negative GI ROS,   Endo/Other    Renal/GU      Musculoskeletal   Abdominal   Peds  Hematology   Anesthesia Other Findings   Reproductive/Obstetrics                             Anesthesia Physical Anesthesia Plan  ASA: I  Anesthesia Plan: General   Post-op Pain Management:    Induction: Intravenous  Airway Management Planned: LMA  Additional Equipment:   Intra-op Plan:   Post-operative Plan: Extubation in OR  Informed Consent: I have reviewed the patients History and Physical, chart, labs and discussed the procedure including the risks, benefits and alternatives for the proposed anesthesia with the patient or authorized representative who has indicated his/her understanding and acceptance.     Plan Discussed with:   Anesthesia Plan Comments:         Anesthesia Quick Evaluation

## 2016-08-21 NOTE — Interval H&P Note (Signed)
History and Physical Interval Note:  08/21/2016 7:19 AM  Marcus Jensen  has presented today for surgery, with the diagnosis of right inguinal hernia  The various methods of treatment have been discussed with the patient and family. After consideration of risks, benefits and other options for treatment, the patient has consented to  Procedure(s): RIGHT INGUINAL HERNIORRHAPHY WITH MESH (Right) as a surgical intervention .  The patient's history has been reviewed, patient examined, no change in status, stable for surgery.  I have reviewed the patient's chart and labs.  Questions were answered to the patient's satisfaction.     Aviva Signs A

## 2016-08-21 NOTE — Anesthesia Procedure Notes (Signed)
Procedure Name: LMA Insertion Performed by: Ollen Bowl Pre-anesthesia Checklist: Patient identified, Patient being monitored, Emergency Drugs available, Timeout performed and Suction available Patient Re-evaluated:Patient Re-evaluated prior to inductionOxygen Delivery Method: Circle System Utilized Preoxygenation: Pre-oxygenation with 100% oxygen Intubation Type: IV induction Ventilation: Mask ventilation without difficulty LMA: LMA inserted LMA Size: 5.0 Number of attempts: 1 Placement Confirmation: positive ETCO2 and breath sounds checked- equal and bilateral

## 2016-08-21 NOTE — Transfer of Care (Signed)
Immediate Anesthesia Transfer of Care Note  Patient: Marcus Jensen  Procedure(s) Performed: Procedure(s): RIGHT INGUINAL HERNIORRHAPHY WITH MESH (Right)  Patient Location: PACU  Anesthesia Type:General  Level of Consciousness: awake, alert  and oriented  Airway & Oxygen Therapy: Patient Spontanous Breathing and Patient connected to face mask oxygen  Post-op Assessment: Report given to RN  Post vital signs: Reviewed and stable  Last Vitals:  Vitals:   08/21/16 0725 08/21/16 0730  BP: 127/81 112/72  Resp: (!) 0 (!) 0  Temp:      Last Pain:  Vitals:   08/21/16 0637  TempSrc: Oral      Patients Stated Pain Goal: 8 ([phone] [id number])  Complications: No apparent anesthesia complications

## 2016-08-28 ENCOUNTER — Encounter (HOSPITAL_COMMUNITY): Payer: Self-pay | Admitting: General Surgery

## 2016-10-03 ENCOUNTER — Ambulatory Visit (INDEPENDENT_AMBULATORY_CARE_PROVIDER_SITE_OTHER): Payer: BLUE CROSS/BLUE SHIELD

## 2016-10-03 DIAGNOSIS — Z23 Encounter for immunization: Secondary | ICD-10-CM

## 2016-12-16 ENCOUNTER — Ambulatory Visit (INDEPENDENT_AMBULATORY_CARE_PROVIDER_SITE_OTHER): Payer: BLUE CROSS/BLUE SHIELD | Admitting: Family Medicine

## 2016-12-16 ENCOUNTER — Encounter: Payer: Self-pay | Admitting: Family Medicine

## 2016-12-16 VITALS — BP 146/88 | HR 56 | Temp 97.2°F | Ht 73.0 in | Wt 229.0 lb

## 2016-12-16 DIAGNOSIS — N400 Enlarged prostate without lower urinary tract symptoms: Secondary | ICD-10-CM | POA: Diagnosis not present

## 2016-12-16 DIAGNOSIS — Z23 Encounter for immunization: Secondary | ICD-10-CM

## 2016-12-16 DIAGNOSIS — K219 Gastro-esophageal reflux disease without esophagitis: Secondary | ICD-10-CM | POA: Diagnosis not present

## 2016-12-16 DIAGNOSIS — E559 Vitamin D deficiency, unspecified: Secondary | ICD-10-CM | POA: Diagnosis not present

## 2016-12-16 DIAGNOSIS — J301 Allergic rhinitis due to pollen: Secondary | ICD-10-CM | POA: Insufficient documentation

## 2016-12-16 DIAGNOSIS — E7800 Pure hypercholesterolemia, unspecified: Secondary | ICD-10-CM

## 2016-12-16 NOTE — Progress Notes (Signed)
Subjective:    Patient ID: Marcus Jensen, male    DOB: [birth year], 65 y.o.   MRN: [id number]  HPI Pt here for follow up and management of chronic medical problems which includes hyperlipidemia. He is taking medications regularly.The patient is doing well overall and other than some reflux issues he has no complaints. He is due to return an FOBT. He'll get his Prevnar vaccine today. His blood pressure is slightly elevated. His weight is up 7 pounds since his last visit and his BMI is 29.4. The patient denies any chest pain or shortness of breath. He does have some right sternal border discomfort at times and is unrelated to activity. He had an EKG this year and it was normal. His last stress test was in 2015. This sounds more musculoskeletal. He also does complain of some reflux issues and relates this to eating later at night. This is been happening more frequently. He is also gaining some weight. He denies any shortness of breath. He denies any blood in the stool or black tarry bowel movements. His colonoscopies are up-to-date. He's passing his water without problems.    Patient Active Problem List   Diagnosis Date Noted  . Palpitations 01/01/2016  . URI, acute 09/16/2015  . BPH (benign prostatic hyperplasia) 06/11/2015  . Hyperlipemia 02/01/2014  . Cholelithiasis 02/01/2014  . Cervical disc disease 02/01/2014  . Spinal stenosis of cervical region 02/01/2014  . Spermatocele 02/01/2014  . Hydrocele, right 02/01/2014  . Umbilical hernia 48/27/0786  . HEMORRHAGE OF RECTUM AND ANUS 11/11/2009  . HEARTBURN 11/11/2009   Outpatient Encounter Prescriptions as of 12/16/2016  Medication Sig  . b complex vitamins capsule Take 1 capsule by mouth daily.  . cholecalciferol (VITAMIN D) 1000 UNITS tablet Take 2,000 Units by mouth daily.  . Cyanocobalamin (VITAMIN B 12 PO) Take 1 tablet by mouth daily.  Marland Kitchen gabapentin (NEURONTIN) 400 MG capsule Take 400 mg by mouth 2 (two) times daily.   Marland Kitchen  glucosamine-chondroitin 500-400 MG tablet Take 2 tablets by mouth 2 (two) times daily.   . Omega-3 Fatty Acids (FISH OIL) 1000 MG CAPS Take 2 capsules by mouth daily.  . TURMERIC PO Take 1 tablet by mouth daily.  . [DISCONTINUED] oxyCODONE-acetaminophen (PERCOCET) 7.5-325 MG tablet Take 1-2 tablets by mouth every 4 (four) hours as needed.   No facility-administered encounter medications on file as of 12/16/2016.       Review of Systems  Constitutional: Negative.   HENT: Negative.   Eyes: Negative.   Respiratory: Negative.   Cardiovascular: Negative.   Gastrointestinal: Negative.        GERD  Endocrine: Negative.   Genitourinary: Negative.   Musculoskeletal: Negative.   Skin: Negative.   Allergic/Immunologic: Negative.   Neurological: Negative.   Hematological: Negative.   Psychiatric/Behavioral: Negative.        Objective:   Physical Exam  Constitutional: He is oriented to person, place, and time. He appears well-developed and well-nourished. No distress.  HENT:  Head: Normocephalic and atraumatic.  Right Ear: External ear normal.  Left Ear: External ear normal.  Mouth/Throat: Oropharynx is clear and moist. No oropharyngeal exudate.  Nasal congestion bilaterally with turbinate swelling  Eyes: Conjunctivae and EOM are normal. Pupils are equal, round, and reactive to light. Right eye exhibits no discharge. Left eye exhibits no discharge. No scleral icterus.  Neck: Normal range of motion. Neck supple. No thyromegaly present.  No bruits thyromegaly or anterior cervical adenopathy  Cardiovascular: Normal rate, regular rhythm, normal  heart sounds and intact distal pulses.   No murmur heard. The heart is regular at 60/m  Pulmonary/Chest: Effort normal and breath sounds normal. No respiratory distress. He has no wheezes. He has no rales. He exhibits no tenderness.  No axillary adenopathy  Abdominal: Soft. Bowel sounds are normal. He exhibits no mass. There is no tenderness. There  is no rebound and no guarding.  No abdominal tenderness or organ enlargement or bruits  Musculoskeletal: Normal range of motion. He exhibits no edema.  Lymphadenopathy:    He has no cervical adenopathy.  Neurological: He is alert and oriented to person, place, and time. He has normal reflexes. No cranial nerve deficit.  Skin: Skin is warm and dry. No rash noted.  Psychiatric: He has a normal mood and affect. His behavior is normal. Judgment and thought content normal.  Nursing note and vitals reviewed.  BP (!) 143/83 (BP Location: Right Arm)   Pulse (!) 56   Temp 97.2 F (36.2 C) (Oral)   Ht _0  (1.854 m)   Wt 229 lb (103.9 kg)   BMI 30.21 kg/m         Assessment & Plan:  1. Pure hypercholesterolemia -Continue aggressive therapeutic lifestyle changes - BMP8+EGFR - CBC with Differential/Platelet - Hepatic function panel - Lipid panel  2. Benign prostatic hyperplasia, unspecified whether lower urinary tract symptoms present -The patient is having no trouble with passing his water currently. - CBC with Differential/Platelet  3. Vitamin D deficiency -Continue current treatment pending results of lab work - CBC with Differential/Platelet - VITAMIN D 25 Hydroxy (Vit-D Deficiency, Fractures)  4. Chronic allergic rhinitis due to pollen, unspecified seasonality -He will try Flonase and use nasal saline.  5. Gastroesophageal reflux disease, esophagitis presence not specified -He will start on generic ranitidine 150 mg twice daily before breakfast and supper and take this for at least one month to see what kind of results he gets.  Patient Instructions                       Medicare Annual Wellness Visit  [facility] and the medical providers at [facility] strive to bring you the best medical care.  In doing so we not only want to address your current medical conditions and concerns but also to detect new conditions early and prevent illness, disease  and health-related problems.    Medicare offers a yearly Wellness Visit which allows our clinical staff to assess your need for preventative services including immunizations, lifestyle education, counseling to decrease risk of preventable diseases and screening for fall risk and other medical concerns.    This visit is provided free of charge (no copay) for all Medicare recipients. The clinical pharmacists at [facility] have begun to conduct these Wellness Visits which will also include a thorough review of all your medications.    As you primary medical provider recommend that you make an appointment for your Annual Wellness Visit if you have not done so already this year.  You may set up this appointment before you leave today or you may call back [phone] and schedule an appointment.  Please make sure when you call that you mention that you are scheduling your Annual Wellness Visit with the clinical pharmacist so that the appointment may be made for the proper length of time.    The patient should take some ranitidine 150 mg over-the-counter the equate brand and take one twice daily before  breakfast and supper. If he has to take something for his arthralgias he should make sure that he takes this after eating and take a ranitidine before eating. If he persists with the nasal congestion he should consider trying some Flonase over-the-counter 1 spray each nostril daily at bedtime and use nasal saline frequently during the day He should consider purchasing an Omron blood pressure monitor and checking his blood pressure periodically at home. He should come by the office in 23 weeks and let the nurse check his blood pressure here. He should watch his sodium intake and make every effort to lose some of the weight that he has put on recently. For future ophthalmology visits he should consider [facility] eye physicians in [place]

## 2016-12-16 NOTE — Patient Instructions (Addendum)
Medicare Annual Wellness Visit  [facility] and the medical providers at [facility] strive to bring you the best medical care.  In doing so we not only want to address your current medical conditions and concerns but also to detect new conditions early and prevent illness, disease and health-related problems.    Medicare offers a yearly Wellness Visit which allows our clinical staff to assess your need for preventative services including immunizations, lifestyle education, counseling to decrease risk of preventable diseases and screening for fall risk and other medical concerns.    This visit is provided free of charge (no copay) for all Medicare recipients. The clinical pharmacists at [facility] have begun to conduct these Wellness Visits which will also include a thorough review of all your medications.    As you primary medical provider recommend that you make an appointment for your Annual Wellness Visit if you have not done so already this year.  You may set up this appointment before you leave today or you may call back [phone] and schedule an appointment.  Please make sure when you call that you mention that you are scheduling your Annual Wellness Visit with the clinical pharmacist so that the appointment may be made for the proper length of time.    The patient should take some ranitidine 150 mg over-the-counter the equate brand and take one twice daily before breakfast and supper. If he has to take something for his arthralgias he should make sure that he takes this after eating and take a ranitidine before eating. If he persists with the nasal congestion he should consider trying some Flonase over-the-counter 1 spray each nostril daily at bedtime and use nasal saline frequently during the day He should consider purchasing an Omron blood pressure monitor and checking his blood pressure periodically at home. He should come  by the office in 23 weeks and let the nurse check his blood pressure here. He should watch his sodium intake and make every effort to lose some of the weight that he has put on recently. For future ophthalmology visits he should consider [facility] eye physicians in [place]

## 2016-12-17 LAB — CBC WITH DIFFERENTIAL/PLATELET
BASOS: 1 %
Basophils Absolute: 0 10*3/uL (ref 0.0–0.2)
EOS (ABSOLUTE): 0.1 10*3/uL (ref 0.0–0.4)
EOS: 3 %
Hematocrit: 43.1 % (ref 37.5–51.0)
Hemoglobin: 14.7 g/dL (ref 13.0–17.7)
IMMATURE GRANS (ABS): 0 10*3/uL (ref 0.0–0.1)
IMMATURE GRANULOCYTES: 0 %
LYMPHS: 24 %
Lymphocytes Absolute: 1.1 10*3/uL (ref 0.7–3.1)
MCH: 30.4 pg (ref 26.6–33.0)
MCHC: 34.1 g/dL (ref 31.5–35.7)
MCV: 89 fL (ref 79–97)
MONOS ABS: 0.3 10*3/uL (ref 0.1–0.9)
Monocytes: 8 %
NEUTROS PCT: 64 %
Neutrophils Absolute: 2.8 10*3/uL (ref 1.4–7.0)
PLATELETS: 202 10*3/uL (ref 150–379)
RBC: 4.83 x10E6/uL (ref 4.14–5.80)
RDW: 12.7 % (ref 12.3–15.4)
WBC: 4.3 10*3/uL (ref 3.4–10.8)

## 2016-12-17 LAB — LIPID PANEL
CHOL/HDL RATIO: 3.3 ratio (ref 0.0–5.0)
Cholesterol, Total: 224 mg/dL — ABNORMAL HIGH (ref 100–199)
HDL: 68 mg/dL (ref >39)
LDL CALC: 144 mg/dL — AB (ref 0–99)
Triglycerides: 62 mg/dL (ref 0–149)
VLDL CHOLESTEROL CAL: 12 mg/dL (ref 5–40)

## 2016-12-17 LAB — HEPATIC FUNCTION PANEL
ALBUMIN: 4.4 g/dL (ref 3.6–4.8)
ALT: 22 IU/L (ref 0–44)
AST: 28 IU/L (ref 0–40)
Alkaline Phosphatase: 78 IU/L (ref 39–117)
BILIRUBIN TOTAL: 0.4 mg/dL (ref 0.0–1.2)
BILIRUBIN, DIRECT: 0.13 mg/dL (ref 0.00–0.40)
Total Protein: 6.9 g/dL (ref 6.0–8.5)

## 2016-12-17 LAB — BMP8+EGFR
BUN/Creatinine Ratio: 15 (ref 10–24)
BUN: 18 mg/dL (ref 8–27)
CALCIUM: 9.1 mg/dL (ref 8.6–10.2)
CO2: 25 mmol/L (ref 18–29)
CREATININE: 1.17 mg/dL (ref 0.76–1.27)
Chloride: 100 mmol/L (ref 96–106)
GFR calc Af Amer: 76 mL/min/{1.73_m2} (ref >59)
GFR, EST NON AFRICAN AMERICAN: 66 mL/min/{1.73_m2} (ref >59)
GLUCOSE: 82 mg/dL (ref 65–99)
POTASSIUM: 4.1 mmol/L (ref 3.5–5.2)
SODIUM: 140 mmol/L (ref 134–144)

## 2016-12-17 LAB — VITAMIN D 25 HYDROXY (VIT D DEFICIENCY, FRACTURES): Vit D, 25-Hydroxy: 49.1 ng/mL (ref 30.0–100.0)

## 2017-05-07 ENCOUNTER — Encounter: Payer: Self-pay | Admitting: Family Medicine

## 2017-05-07 ENCOUNTER — Ambulatory Visit (INDEPENDENT_AMBULATORY_CARE_PROVIDER_SITE_OTHER): Payer: BLUE CROSS/BLUE SHIELD | Admitting: Family Medicine

## 2017-05-07 VITALS — BP 124/76 | HR 65 | Temp 97.5°F | Ht 73.0 in | Wt 222.0 lb

## 2017-05-07 DIAGNOSIS — E559 Vitamin D deficiency, unspecified: Secondary | ICD-10-CM | POA: Diagnosis not present

## 2017-05-07 DIAGNOSIS — E7800 Pure hypercholesterolemia, unspecified: Secondary | ICD-10-CM | POA: Diagnosis not present

## 2017-05-07 DIAGNOSIS — K219 Gastro-esophageal reflux disease without esophagitis: Secondary | ICD-10-CM | POA: Diagnosis not present

## 2017-05-07 DIAGNOSIS — N400 Enlarged prostate without lower urinary tract symptoms: Secondary | ICD-10-CM | POA: Diagnosis not present

## 2017-05-07 MED ORDER — ROSUVASTATIN CALCIUM 20 MG PO TABS: 20.0000 mg | ORAL_TABLET | Freq: Every day | ORAL | 3 refills | Status: DC

## 2017-05-07 NOTE — Progress Notes (Signed)
Subjective:    Patient ID: Marcus Jensen, male    DOB: Apr 23, 1951, 66 y.o.   MRN: [id number]  HPI Pt here for follow up and management of chronic medical problems which includes hyperlipidemia. He is taking medication regularly.The patient is doing well overall. He has never had a colonoscopy that I am aware of and he is due to get one or at least to return an FOBT. He has no specific complaints. He has a history of reflux disease BPH hyperlipidemia cervical disc disease with some spinal stenosis umbilical hernia and hydrocele on the right. He has no complaints with any of these issues today. His weight is 222 which is down 7 pounds from the last visit. His body mass index is 30. The patient is not taking his Crestor anymore. He plans to restart this but we will wait until his cholesterol numbers are returned on the blood work before we do this. The patient denies any chest pain or shortness of breath. He denies any trouble with his reflux currently and has no problems with his bowel movements including nausea vomiting diarrhea blood in the stool or black tarry bowel movements. He is passing his water without problems. He denies any problems with his neck and since he's been working only 2 days a week he does seem to be much calmer and much more relaxed and has had less neck pain and neuropathy.    Patient Active Problem List   Diagnosis Date Noted  . Vitamin D deficiency 12/16/2016  . Chronic allergic rhinitis due to pollen 12/16/2016  . Gastroesophageal reflux disease 12/16/2016  . Palpitations 01/01/2016  . URI, acute 09/16/2015  . BPH (benign prostatic hyperplasia) 06/11/2015  . Hyperlipemia 02/01/2014  . Cholelithiasis 02/01/2014  . Cervical disc disease 02/01/2014  . Spinal stenosis of cervical region 02/01/2014  . Spermatocele 02/01/2014  . Hydrocele, right 02/01/2014  . Umbilical hernia 63/87/5643  . HEMORRHAGE OF RECTUM AND ANUS 11/11/2009  . HEARTBURN 11/11/2009   Outpatient  Encounter Prescriptions as of 05/07/2017  Medication Sig  . b complex vitamins capsule Take 1 capsule by mouth daily.  . cholecalciferol (VITAMIN D) 1000 UNITS tablet Take 2,000 Units by mouth daily.  . Cyanocobalamin (VITAMIN B 12 PO) Take 1 tablet by mouth daily.  Marland Kitchen gabapentin (NEURONTIN) 400 MG capsule Take 400 mg by mouth 2 (two) times daily.   Marland Kitchen glucosamine-chondroitin 500-400 MG tablet Take 2 tablets by mouth 2 (two) times daily.   . Omega-3 Fatty Acids (FISH OIL) 1000 MG CAPS Take 2 capsules by mouth daily.  . TURMERIC PO Take 1 tablet by mouth daily.   No facility-administered encounter medications on file as of 05/07/2017.       Review of Systems  Constitutional: Negative.   HENT: Negative.   Eyes: Negative.   Respiratory: Negative.   Cardiovascular: Negative.   Gastrointestinal: Negative.   Endocrine: Negative.   Genitourinary: Negative.   Musculoskeletal: Negative.   Skin: Negative.   Allergic/Immunologic: Negative.   Neurological: Negative.   Hematological: Negative.   Psychiatric/Behavioral: Negative.        Objective:   Physical Exam  Constitutional: He is oriented to person, place, and time. He appears well-developed and well-nourished. No distress.  The patient is pleasant and calm and relaxed  HENT:  Head: Normocephalic and atraumatic.  Right Ear: External ear normal.  Left Ear: External ear normal.  Mouth/Throat: Oropharynx is clear and moist. No oropharyngeal exudate.  Slight nasal congestion bilaterally  Eyes: Conjunctivae  and EOM are normal. Pupils are equal, round, and reactive to light. Right eye exhibits no discharge. Left eye exhibits no discharge. No scleral icterus.  Neck: Normal range of motion. Neck supple. No thyromegaly present.  No bruits thyromegaly or anterior cervical adenopathy  Cardiovascular: Normal rate, regular rhythm, normal heart sounds and intact distal pulses.   No murmur heard. The heart has a regular rate and rhythm at 60/m    Pulmonary/Chest: Effort normal and breath sounds normal. No respiratory distress. He has no wheezes. He has no rales. He exhibits no tenderness.  Clear anteriorly and posteriorly without axillary adenopathy  Abdominal: Soft. Bowel sounds are normal. He exhibits no mass. There is no tenderness. There is no rebound and no guarding.  No abdominal tenderness masses or organ enlargement bruits or inguinal adenopathy  Musculoskeletal: Normal range of motion. He exhibits no edema.  Lymphadenopathy:    He has no cervical adenopathy.  Neurological: He is alert and oriented to person, place, and time. He has normal reflexes. No cranial nerve deficit.  Skin: Skin is warm and dry. No rash noted.  Psychiatric: He has a normal mood and affect. His behavior is normal. Judgment and thought content normal.  Nursing note and vitals reviewed.   BP (!) 144/81 (BP Location: Left Arm)   Pulse 65   Temp 97.5 F (36.4 C) (Oral)   Ht '6\' 1"'$  (1.854 m)   Wt 222 lb (100.7 kg)   BMI 29.29 kg/m        Assessment & Plan:  1. Vitamin D deficiency -Continue current treatment pending results of lab work - CBC with Differential/Platelet - VITAMIN D 25 Hydroxy (Vit-D Deficiency, Fractures)  2. Pure hypercholesterolemia -The patient will restart his Crestor but we will wait until the lab work is returned to see what his current cholesterol numbers are. - BMP8+EGFR - CBC with Differential/Platelet - Hepatic function panel - NMR, lipoprofile  3. Benign prostatic hyperplasia, unspecified whether lower urinary tract symptoms present -No symptoms with voiding. - CBC with Differential/Platelet  4. Gastroesophageal reflux disease, esophagitis presence not specified -He has ranitidine at home and occasionally takes times for this but this is been better and he is not taking any medicine regularly. - CBC with Differential/Platelet - Hepatic function panel  Meds ordered this encounter  Medications  . rosuvastatin  (CRESTOR) 20 MG tablet    Sig: Take 1 tablet (20 mg total) by mouth daily.    Dispense:  90 tablet    Refill:  3   Patient Instructions  Continue current medications. Continue good therapeutic lifestyle changes which include good diet and exercise. Fall precautions discussed with patient. If an FOBT was given today- please return it to our front desk. If you are over 16 years old - you may need Prevnar 20 or the adult Pneumonia vaccine.  **Flu shots are available--- please call and schedule a FLU-CLINIC appointment**  After your visit with [place] today you will receive a survey in the mail or online from [employer] regarding your care with [place]. Please take a moment to fill this out. Your feedback is very important to [place] as you can help [place] better understand your patient needs as well as improve your experience and satisfaction. WE CARE ABOUT YOU!!!      Arrie Senate MD

## 2017-05-07 NOTE — Patient Instructions (Addendum)
Continue current medications. Continue good therapeutic lifestyle changes which include good diet and exercise. Fall precautions discussed with patient. If an FOBT was given today- please return it to our front desk. If you are over 64 years old - you may need Prevnar 39 or the adult Pneumonia vaccine.  **Flu shots are available--- please call and schedule a FLU-CLINIC appointment**  After your visit with [place] today you will receive a survey in the mail or online from [employer] regarding your care with [place]. Please take a moment to fill this out. Your feedback is very important to [place] as you can help [place] better understand your patient needs as well as improve your experience and satisfaction. WE CARE ABOUT YOU!!!  Continue to work on weight and watch sodium intake Try to get your body mass index down closer to 25 with diet and exercise Remember to avoid heavy lifting so that you do not stress your cervical spine issues Stay active physically Drink plenty water this summer and stay well hydrated and reduce sugar intake

## 2017-05-08 LAB — BMP8+EGFR
BUN / CREAT RATIO: 15 (ref 10–24)
BUN: 14 mg/dL (ref 8–27)
CHLORIDE: 101 mmol/L (ref 96–106)
CO2: 23 mmol/L (ref 18–29)
CREATININE: 0.95 mg/dL (ref 0.76–1.27)
Calcium: 9.4 mg/dL (ref 8.6–10.2)
GFR calc Af Amer: 98 mL/min/{1.73_m2} (ref >59)
GFR calc non Af Amer: 85 mL/min/{1.73_m2} (ref >59)
GLUCOSE: 101 mg/dL — AB (ref 65–99)
POTASSIUM: 4.3 mmol/L (ref 3.5–5.2)
SODIUM: 140 mmol/L (ref 134–144)

## 2017-05-08 LAB — CBC WITH DIFFERENTIAL/PLATELET
Basophils Absolute: 0 10*3/uL (ref 0.0–0.2)
Basos: 1 %
EOS (ABSOLUTE): 0.1 10*3/uL (ref 0.0–0.4)
EOS: 2 %
HEMATOCRIT: 42.8 % (ref 37.5–51.0)
HEMOGLOBIN: 14.6 g/dL (ref 13.0–17.7)
Immature Grans (Abs): 0 10*3/uL (ref 0.0–0.1)
Immature Granulocytes: 0 %
LYMPHS ABS: 1 10*3/uL (ref 0.7–3.1)
Lymphs: 24 %
MCH: 30.5 pg (ref 26.6–33.0)
MCHC: 34.1 g/dL (ref 31.5–35.7)
MCV: 89 fL (ref 79–97)
MONOS ABS: 0.4 10*3/uL (ref 0.1–0.9)
Monocytes: 9 %
Neutrophils Absolute: 2.6 10*3/uL (ref 1.4–7.0)
Neutrophils: 64 %
Platelets: 183 10*3/uL (ref 150–379)
RBC: 4.79 x10E6/uL (ref 4.14–5.80)
RDW: 13.4 % (ref 12.3–15.4)
WBC: 4.1 10*3/uL (ref 3.4–10.8)

## 2017-05-08 LAB — NMR, LIPOPROFILE
Cholesterol: 229 mg/dL — ABNORMAL HIGH (ref 100–199)
HDL Cholesterol by NMR: 62 mg/dL (ref >39)
HDL PARTICLE NUMBER: 33.5 umol/L (ref >=30.5)
LDL Particle Number: 1665 nmol/L — ABNORMAL HIGH (ref <1000)
LDL SIZE: 21.4 nm (ref >20.5)
LDL-C: 150 mg/dL — ABNORMAL HIGH (ref 0–99)
LP-IR Score: <25 (ref <=45)
SMALL LDL PARTICLE NUMBER: 612 nmol/L — AB (ref <=527)
TRIGLYCERIDES BY NMR: 83 mg/dL (ref 0–149)

## 2017-05-08 LAB — HEPATIC FUNCTION PANEL
ALBUMIN: 4.6 g/dL (ref 3.6–4.8)
ALK PHOS: 74 IU/L (ref 39–117)
ALT: 22 IU/L (ref 0–44)
AST: 28 IU/L (ref 0–40)
BILIRUBIN TOTAL: 0.8 mg/dL (ref 0.0–1.2)
BILIRUBIN, DIRECT: 0.2 mg/dL (ref 0.00–0.40)
Total Protein: 7.2 g/dL (ref 6.0–8.5)

## 2017-05-08 LAB — VITAMIN D 25 HYDROXY (VIT D DEFICIENCY, FRACTURES): Vit D, 25-Hydroxy: 54.1 ng/mL (ref 30.0–100.0)

## 2017-09-06 ENCOUNTER — Other Ambulatory Visit: Payer: BLUE CROSS/BLUE SHIELD

## 2017-09-06 DIAGNOSIS — Z1211 Encounter for screening for malignant neoplasm of colon: Secondary | ICD-10-CM

## 2017-09-09 LAB — FECAL OCCULT BLOOD, IMMUNOCHEMICAL: Fecal Occult Bld: NEGATIVE

## 2017-09-23 ENCOUNTER — Telehealth: Payer: Self-pay | Admitting: Family Medicine

## 2017-09-23 DIAGNOSIS — N882 Stricture and stenosis of cervix uteri: Secondary | ICD-10-CM

## 2017-09-23 DIAGNOSIS — M4802 Spinal stenosis, cervical region: Secondary | ICD-10-CM

## 2017-09-23 DIAGNOSIS — M5412 Radiculopathy, cervical region: Secondary | ICD-10-CM

## 2017-09-23 DIAGNOSIS — R29898 Other symptoms and signs involving the musculoskeletal system: Secondary | ICD-10-CM

## 2017-09-23 DIAGNOSIS — M542 Cervicalgia: Secondary | ICD-10-CM

## 2017-09-23 NOTE — Telephone Encounter (Signed)
Pt is having right side neck and upper back pain - thinks its a flair up - of the ongoing cervical pain Worsening x last few weeks - having to use more naproxen  - hard to sleep  Has a dull neck pain and some right arm weakness (getting worse)   He hasnt had a MRI since 2013 -  Seen Dr Rita Ohara in the past   Should he go the ortho route or Neurosurgeon? Please advise and I will call him later today

## 2017-09-23 NOTE — Telephone Encounter (Signed)
Called and aware = MRI ordered and Neurosurg referral placed He will start Prednisone taper

## 2017-09-23 NOTE — Telephone Encounter (Signed)
Please schedule visit with neurosurgery. Get repeat MRI of cervical spine. Give patient a short course of prednisone and he should leave off Naprosyn while taking the prednisone until he can see the neurosurgeon. Prednisone 10 Taper, 3 day taper. Once again, he should not take Naprosyn with prednisone.

## 2017-10-13 ENCOUNTER — Telehealth: Payer: Self-pay

## 2017-10-13 DIAGNOSIS — M542 Cervicalgia: Secondary | ICD-10-CM

## 2017-10-13 DIAGNOSIS — G629 Polyneuropathy, unspecified: Secondary | ICD-10-CM

## 2017-10-13 DIAGNOSIS — Z8739 Personal history of other diseases of the musculoskeletal system and connective tissue: Secondary | ICD-10-CM

## 2017-10-13 DIAGNOSIS — M4802 Spinal stenosis, cervical region: Secondary | ICD-10-CM

## 2017-10-13 DIAGNOSIS — G8929 Other chronic pain: Secondary | ICD-10-CM

## 2017-10-13 DIAGNOSIS — R29898 Other symptoms and signs involving the musculoskeletal system: Secondary | ICD-10-CM

## 2017-10-13 NOTE — Telephone Encounter (Signed)
To referral to rehab for conservative therapy for cervical spine arthritis with neuropathy per insurance request

## 2017-10-13 NOTE — Telephone Encounter (Signed)
Pt has new bilateral arm weakness, history of the neck pain and now some left arm numbness as well - we will add these DX and see if we can get this covered

## 2017-10-13 NOTE — Telephone Encounter (Signed)
Insurance denied his MRI Neck said need to have documented 6 weeks conservative therapy for neck pain

## 2017-10-14 ENCOUNTER — Ambulatory Visit (HOSPITAL_COMMUNITY): Payer: BLUE CROSS/BLUE SHIELD

## 2017-10-15 ENCOUNTER — Other Ambulatory Visit: Payer: Self-pay

## 2017-10-15 DIAGNOSIS — R29898 Other symptoms and signs involving the musculoskeletal system: Secondary | ICD-10-CM | POA: Insufficient documentation

## 2017-10-15 DIAGNOSIS — M542 Cervicalgia: Secondary | ICD-10-CM

## 2017-10-21 ENCOUNTER — Ambulatory Visit (INDEPENDENT_AMBULATORY_CARE_PROVIDER_SITE_OTHER): Payer: BLUE CROSS/BLUE SHIELD

## 2017-10-21 DIAGNOSIS — Z23 Encounter for immunization: Secondary | ICD-10-CM | POA: Diagnosis not present

## 2017-11-09 ENCOUNTER — Encounter: Payer: Self-pay | Admitting: Family Medicine

## 2017-11-09 ENCOUNTER — Ambulatory Visit (INDEPENDENT_AMBULATORY_CARE_PROVIDER_SITE_OTHER): Payer: BLUE CROSS/BLUE SHIELD | Admitting: Family Medicine

## 2017-11-09 VITALS — BP 132/83 | HR 61 | Temp 97.4°F | Ht 73.0 in | Wt 222.0 lb

## 2017-11-09 DIAGNOSIS — N400 Enlarged prostate without lower urinary tract symptoms: Secondary | ICD-10-CM

## 2017-11-09 DIAGNOSIS — E559 Vitamin D deficiency, unspecified: Secondary | ICD-10-CM

## 2017-11-09 DIAGNOSIS — Z0000 Encounter for general adult medical examination without abnormal findings: Secondary | ICD-10-CM | POA: Diagnosis not present

## 2017-11-09 DIAGNOSIS — Z1159 Encounter for screening for other viral diseases: Secondary | ICD-10-CM

## 2017-11-09 DIAGNOSIS — G609 Hereditary and idiopathic neuropathy, unspecified: Secondary | ICD-10-CM

## 2017-11-09 DIAGNOSIS — E7800 Pure hypercholesterolemia, unspecified: Secondary | ICD-10-CM

## 2017-11-09 DIAGNOSIS — N4340 Spermatocele of epididymis, unspecified: Secondary | ICD-10-CM

## 2017-11-09 DIAGNOSIS — K219 Gastro-esophageal reflux disease without esophagitis: Secondary | ICD-10-CM

## 2017-11-09 DIAGNOSIS — K429 Umbilical hernia without obstruction or gangrene: Secondary | ICD-10-CM

## 2017-11-09 DIAGNOSIS — Z0100 Encounter for examination of eyes and vision without abnormal findings: Secondary | ICD-10-CM

## 2017-11-09 LAB — URINALYSIS, COMPLETE
Bilirubin, UA: NEGATIVE
Glucose, UA: NEGATIVE
Ketones, UA: NEGATIVE
Leukocytes, UA: NEGATIVE
NITRITE UA: NEGATIVE
PH UA: 5 (ref 5.0–7.5)
Protein, UA: NEGATIVE
RBC, UA: NEGATIVE
Specific Gravity, UA: 1.025 (ref 1.005–1.030)
UUROB: 0.2 mg/dL (ref 0.2–1.0)

## 2017-11-09 LAB — MICROSCOPIC EXAMINATION
BACTERIA UA: NONE SEEN
Epithelial Cells (non renal): NONE SEEN /hpf (ref 0–10)
RBC, UA: NONE SEEN /hpf (ref 0–?)
RENAL EPITHEL UA: NONE SEEN /HPF
WBC UA: NONE SEEN /HPF (ref 0–?)

## 2017-11-09 NOTE — Progress Notes (Signed)
Subjective:    Patient ID: Marcus Jensen, male    DOB: 09-Dec-1951, 66 y.o.   MRN: [id number]  HPI Patient is here today for annual wellness exam and follow up of chronic medical problems which includes hyperlipidemia. He is taking medication regularly.  The patient is doing well overall other than joint issues which include low back issues and cervical spine issues.  He did see the neurosurgeon recently and the neurosurgeon wanted to continue with conservative treatment and physical therapy.  The pain from his neck goes to the right shoulder.  He will get lab work today.  His last colonoscopy was in December.  This patient has a history of cervical disc disease and lumbar disc disease with spinal stenosis of the cervical region.  He has history of hyperlipidemia and chronic allergic rhinitis and GERD.  He is taking his Crestor now along with omega-3 fatty acids.  The patient denies any chest pain or shortness of breath anymore than usual.  He does have problems with reflux and he attributes this to some of his chest discomfort at times.  He denies any trouble with swallowing nausea vomiting diarrhea blood in the stool or black tarry bowel movements.  He is up-to-date on his colonoscopies.  He does have occasional bright red blood but attributes this to hemorrhoids.  His most recent FOBT was negative.  As far as passing his water is concerned it is slower and there is less strength behind the stream.  He denies any burning or red blood cells that he is seen.  He is also behind on his eye exam.     Patient Active Problem List   Diagnosis Date Noted  . Vitamin D deficiency 12/16/2016  . Chronic allergic rhinitis due to pollen 12/16/2016  . Gastroesophageal reflux disease 12/16/2016  . Palpitations 01/01/2016  . URI, acute 09/16/2015  . BPH (benign prostatic hyperplasia) 06/11/2015  . Hyperlipemia 02/01/2014  . Cholelithiasis 02/01/2014  . Cervical disc disease 02/01/2014  . Spinal stenosis of  cervical region 02/01/2014  . Spermatocele 02/01/2014  . Hydrocele, right 02/01/2014  . Umbilical hernia 78/46/9629  . HEMORRHAGE OF RECTUM AND ANUS 11/11/2009  . HEARTBURN 11/11/2009   Outpatient Encounter Medications as of 11/09/2017  Medication Sig  . b complex vitamins capsule Take 1 capsule by mouth daily.  . cholecalciferol (VITAMIN D) 1000 UNITS tablet Take 2,000 Units by mouth daily.  . Cyanocobalamin (VITAMIN B 12 PO) Take 1 tablet by mouth daily.  Marland Kitchen gabapentin (NEURONTIN) 400 MG capsule Take 400 mg by mouth 2 (two) times daily.   Marland Kitchen glucosamine-chondroitin 500-400 MG tablet Take 2 tablets by mouth 2 (two) times daily.   . Omega-3 Fatty Acids (FISH OIL) 1000 MG CAPS Take 2 capsules by mouth daily.  . rosuvastatin (CRESTOR) 20 MG tablet Take 1 tablet (20 mg total) by mouth daily.  . TURMERIC PO Take 1 tablet by mouth daily.   No facility-administered encounter medications on file as of 11/09/2017.      Review of Systems  Constitutional: Negative.   HENT: Negative.   Eyes: Negative.   Respiratory: Negative.   Cardiovascular: Negative.   Gastrointestinal: Negative.   Endocrine: Negative.   Genitourinary: Negative.   Musculoskeletal: Positive for neck pain (and shoulder pain -right side ).  Skin: Negative.   Allergic/Immunologic: Negative.   Neurological: Negative.   Hematological: Negative.   Psychiatric/Behavioral: Negative.        Objective:   Physical Exam  Constitutional: He is oriented  to person, place, and time. He appears well-developed and well-nourished. No distress.  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  HENT:  Head: Normocephalic and atraumatic.  Right Ear: External ear normal.  Left Ear: External ear normal.  Mouth/Throat: Oropharynx is clear and moist. No oropharyngeal exudate.  Nasal turbinate congestion bilaterally but this does not bother the patient.  Eyes: Conjunctivae and EOM are normal. Pupils are  equal, round, and reactive to light. Right eye exhibits no discharge. Left eye exhibits no discharge. No scleral icterus.  Patient is aware of his need to get an eye exam.  Neck: Normal range of motion. Neck supple. No thyromegaly present.  No carotid bruits but faint left supraclavicular bruit with good radial pulse on that side  Cardiovascular: Normal rate, regular rhythm, normal heart sounds and intact distal pulses.  No murmur heard. Heart is regular at 72/min  Pulmonary/Chest: Effort normal and breath sounds normal. No respiratory distress. He has no wheezes. He has no rales. He exhibits no tenderness.  Clear anteriorly and posteriorly and no axillary adenopathy  Abdominal: Soft. Bowel sounds are normal. He exhibits no mass. There is no tenderness. There is no rebound and no guarding.  No epigastric tenderness no liver or spleen enlargement no masses no bruits and no inguinal adenopathy  Genitourinary: Rectum normal and penis normal.  Genitourinary Comments: The prostate is slightly enlarged with no lumps or masses.  The external genitalia were within normal limits presence of a spermatocele/hydrocele on the right side.  There were no rectal masses.  Musculoskeletal: Normal range of motion. He exhibits no edema.  Lymphadenopathy:    He has no cervical adenopathy.  Neurological: He is alert and oriented to person, place, and time. He has normal reflexes. No cranial nerve deficit.  The patient had diminished sensation on the bottom of the right foot and the right great toe and fourth toe.  Skin: Skin is warm and dry. No rash noted.  Psychiatric: He has a normal mood and affect. His behavior is normal. Judgment and thought content normal.  Nursing note and vitals reviewed.  BP 132/83 (BP Location: Left Arm)   Pulse 61   Temp (!) 97.4 F (36.3 C) (Oral)   Ht '6\' 1"'$  (1.854 m)   Wt 222 lb (100.7 kg)   BMI 29.29 kg/m         Assessment & Plan:  1. Annual physical exam -The patient  is not due for colonoscopy until 2020. - BMP8+EGFR - CBC with Differential/Platelet - NMR, lipoprofile - PSA, total and free - VITAMIN D 25 Hydroxy (Vit-D Deficiency, Fractures) - Hepatic function panel - Urinalysis, Complete  2. Benign prostatic hyperplasia, unspecified whether lower urinary tract symptoms present -The patient does have some of the effects of an enlarged prostate with a slower stream and more frequent voiding.  He tolerates this with out problems. - CBC with Differential/Platelet - PSA, total and free - Urinalysis, Complete  3. Vitamin D deficiency -Continue current treatment pending results of lab work - CBC with Differential/Platelet - VITAMIN D 25 Hydroxy (Vit-D Deficiency, Fractures)  4. Pure hypercholesterolemia -Continue current treatment pending results of lab work - CBC with Differential/Platelet - NMR, lipoprofile - Hepatic function panel - Exercise Tolerance Test; Future  5. Gastroesophageal reflux disease, esophagitis presence not specified Continue treatment and patient has minimal complaints with this today. - CBC with Differential/Platelet - Hepatic function panel  6. Encounter for vision screening - Ambulatory referral to Ophthalmology  7. Umbilical hernia without obstruction and without gangrene -No problems with this currently but patient understands if he develops pain he should get to the emergency room immediately  8. Idiopathic peripheral neuropathy -There is diminished sensation to the bottom of the right foot.  9. Spermatocele -This is been present for several years on the right side and we will continue to monitor this.  10. Encounter for hepatitis C screening test for low risk patient - Hepatitis C antibody   Patient Instructions  Continue current medications. Continue good therapeutic lifestyle changes which include good diet and exercise. Fall precautions discussed with patient. If an FOBT was given today- please return  it to our front desk. If you are over 40 years old - you may need Prevnar 50 or the adult Pneumonia vaccine.  **Flu shots are available--- please call and schedule a FLU-CLINIC appointment**  After your visit with [place] today you will receive a survey in the mail or online from [employer] regarding your care with [place]. Please take a moment to fill this out. Your feedback is very important to [place] as you can help [place] better understand your patient needs as well as improve your experience and satisfaction. WE CARE ABOUT YOU!!!   Drs. Renford Dills and Josie Dixon or with [facility] eye physicians in [place] and we would like for you to see them for your eye exam Avoid climbing so as not to fall and injure your neck or back severely We will arrange for you to have a stress test in this office by Dr. Warrick Parisian. This winter continue to drink plenty of fluids and stay well-hydrated. Continue to work on weight through diet and exercise Follow-up with neurosurgery as planned      Arrie Senate MD

## 2017-11-09 NOTE — Patient Instructions (Addendum)
Continue current medications. Continue good therapeutic lifestyle changes which include good diet and exercise. Fall precautions discussed with patient. If an FOBT was given today- please return it to our front desk. If you are over 64 years old - you may need Prevnar 33 or the adult Pneumonia vaccine.  **Flu shots are available--- please call and schedule a FLU-CLINIC appointment**  After your visit with [place] today you will receive a survey in the mail or online from [employer] regarding your care with [place]. Please take a moment to fill this out. Your feedback is very important to [place] as you can help [place] better understand your patient needs as well as improve your experience and satisfaction. WE CARE ABOUT YOU!!!   Drs. Renford Dills and Josie Dixon or with [facility] eye physicians in [place] and we would like for you to see them for your eye exam Avoid climbing so as not to fall and injure your neck or back severely We will arrange for you to have a stress test in this office by Dr. Warrick Parisian. This winter continue to drink plenty of fluids and stay well-hydrated. Continue to work on weight through diet and exercise Follow-up with neurosurgery as planned

## 2017-11-10 LAB — CBC WITH DIFFERENTIAL/PLATELET
BASOS: 1 %
Basophils Absolute: 0 10*3/uL (ref 0.0–0.2)
EOS (ABSOLUTE): 0.1 10*3/uL (ref 0.0–0.4)
EOS: 3 %
HEMATOCRIT: 42 % (ref 37.5–51.0)
Hemoglobin: 14.2 g/dL (ref 13.0–17.7)
IMMATURE GRANS (ABS): 0 10*3/uL (ref 0.0–0.1)
Immature Granulocytes: 0 %
LYMPHS: 27 %
Lymphocytes Absolute: 1.1 10*3/uL (ref 0.7–3.1)
MCH: 30.8 pg (ref 26.6–33.0)
MCHC: 33.8 g/dL (ref 31.5–35.7)
MCV: 91 fL (ref 79–97)
Monocytes Absolute: 0.4 10*3/uL (ref 0.1–0.9)
Monocytes: 9 %
NEUTROS ABS: 2.4 10*3/uL (ref 1.4–7.0)
Neutrophils: 60 %
PLATELETS: 180 10*3/uL (ref 150–379)
RBC: 4.61 x10E6/uL (ref 4.14–5.80)
RDW: 13.1 % (ref 12.3–15.4)
WBC: 3.9 10*3/uL (ref 3.4–10.8)

## 2017-11-10 LAB — HEPATIC FUNCTION PANEL
ALBUMIN: 4.6 g/dL (ref 3.6–4.8)
ALT: 26 IU/L (ref 0–44)
AST: 30 IU/L (ref 0–40)
Alkaline Phosphatase: 76 IU/L (ref 39–117)
BILIRUBIN TOTAL: 0.8 mg/dL (ref 0.0–1.2)
BILIRUBIN, DIRECT: 0.24 mg/dL (ref 0.00–0.40)
TOTAL PROTEIN: 6.8 g/dL (ref 6.0–8.5)

## 2017-11-10 LAB — BMP8+EGFR
BUN / CREAT RATIO: 15 (ref 10–24)
BUN: 19 mg/dL (ref 8–27)
CALCIUM: 9.3 mg/dL (ref 8.6–10.2)
CO2: 22 mmol/L (ref 20–29)
Chloride: 103 mmol/L (ref 96–106)
Creatinine, Ser: 1.25 mg/dL (ref 0.76–1.27)
GFR, EST AFRICAN AMERICAN: 70 mL/min/{1.73_m2} (ref >59)
GFR, EST NON AFRICAN AMERICAN: 60 mL/min/{1.73_m2} (ref >59)
Glucose: 86 mg/dL (ref 65–99)
POTASSIUM: 4.2 mmol/L (ref 3.5–5.2)
SODIUM: 142 mmol/L (ref 134–144)

## 2017-11-10 LAB — NMR, LIPOPROFILE
Cholesterol: 150 mg/dL (ref 100–199)
HDL CHOLESTEROL BY NMR: 64 mg/dL (ref >39)
HDL Particle Number: 35.9 umol/L (ref >=30.5)
LDL Particle Number: 881 nmol/L (ref <1000)
LDL Size: 21 nm (ref >20.5)
LDL-C: 75 mg/dL (ref 0–99)
SMALL LDL PARTICLE NUMBER: 344 nmol/L (ref <=527)
Triglycerides by NMR: 54 mg/dL (ref 0–149)

## 2017-11-10 LAB — PSA, TOTAL AND FREE
PSA FREE PCT: 26.5 %
PSA, Free: 0.61 ng/mL
Prostate Specific Ag, Serum: 2.3 ng/mL (ref 0.0–4.0)

## 2017-11-10 LAB — VITAMIN D 25 HYDROXY (VIT D DEFICIENCY, FRACTURES): VIT D 25 HYDROXY: 63.3 ng/mL (ref 30.0–100.0)

## 2017-11-10 LAB — HEPATITIS C ANTIBODY: Hep C Virus Ab: 0.1 s/co ratio (ref 0.0–0.9)

## 2017-11-18 ENCOUNTER — Telehealth: Payer: Self-pay | Admitting: *Deleted

## 2017-11-18 NOTE — Telephone Encounter (Signed)
Please review for treadmill

## 2017-11-18 NOTE — Telephone Encounter (Signed)
These forward to Dr. Warrick Parisian

## 2017-11-22 NOTE — Telephone Encounter (Signed)
Okay to schedule for treadmill

## 2018-03-21 ENCOUNTER — Encounter: Payer: Self-pay | Admitting: Family Medicine

## 2018-03-21 DIAGNOSIS — H35363 Drusen (degenerative) of macula, bilateral: Secondary | ICD-10-CM | POA: Insufficient documentation

## 2018-03-21 DIAGNOSIS — H2513 Age-related nuclear cataract, bilateral: Secondary | ICD-10-CM | POA: Insufficient documentation

## 2018-03-21 LAB — HM DIABETES EYE EXAM

## 2018-03-24 DIAGNOSIS — M25562 Pain in left knee: Secondary | ICD-10-CM | POA: Insufficient documentation

## 2018-04-28 ENCOUNTER — Telehealth: Payer: Self-pay | Admitting: Family Medicine

## 2018-04-28 NOTE — Telephone Encounter (Signed)
What symptoms do you have? Muscle spasms in the whole right side area with pain going down leg in the sciatic nerve. Patient is asking for Cyclobenaprine.  How long have you been sick? Two days  Have you been seen for this problem? Yes   If your provider decides to give you a prescription, which pharmacy would you like for it to be sent to? [facility] in [place]   Patient informed that this information will be sent to the clinical staff for review and that they should receive a follow up call.

## 2018-04-29 ENCOUNTER — Telehealth: Payer: Self-pay | Admitting: Family Medicine

## 2018-04-29 MED ORDER — CYCLOBENZAPRINE HCL 10 MG PO TABS: 5.0000 mg | ORAL_TABLET | Freq: Three times a day (TID) | ORAL | 0 refills | Status: DC | PRN

## 2018-04-29 NOTE — Telephone Encounter (Signed)
Aware cyclobenzaprine

## 2018-04-29 NOTE — Telephone Encounter (Signed)
Back issues sciatica pain  Lower back and right leg Some spams.  Needs flexeril rf sent  In [facility] on vacation

## 2018-04-29 NOTE — Telephone Encounter (Signed)
Has already been sent to pharmacy 

## 2018-05-18 ENCOUNTER — Ambulatory Visit: Payer: BLUE CROSS/BLUE SHIELD | Admitting: Family Medicine

## 2018-06-20 ENCOUNTER — Ambulatory Visit (INDEPENDENT_AMBULATORY_CARE_PROVIDER_SITE_OTHER): Payer: Medicare Other | Admitting: Family Medicine

## 2018-06-20 ENCOUNTER — Ambulatory Visit (INDEPENDENT_AMBULATORY_CARE_PROVIDER_SITE_OTHER): Payer: Medicare Other

## 2018-06-20 ENCOUNTER — Encounter: Payer: Self-pay | Admitting: Family Medicine

## 2018-06-20 VITALS — BP 136/79 | HR 62 | Temp 97.7°F | Ht 73.0 in | Wt 214.0 lb

## 2018-06-20 DIAGNOSIS — K219 Gastro-esophageal reflux disease without esophagitis: Secondary | ICD-10-CM

## 2018-06-20 DIAGNOSIS — G609 Hereditary and idiopathic neuropathy, unspecified: Secondary | ICD-10-CM

## 2018-06-20 DIAGNOSIS — N400 Enlarged prostate without lower urinary tract symptoms: Secondary | ICD-10-CM | POA: Diagnosis not present

## 2018-06-20 DIAGNOSIS — Z23 Encounter for immunization: Secondary | ICD-10-CM | POA: Diagnosis not present

## 2018-06-20 DIAGNOSIS — R059 Cough, unspecified: Secondary | ICD-10-CM

## 2018-06-20 DIAGNOSIS — R05 Cough: Secondary | ICD-10-CM

## 2018-06-20 DIAGNOSIS — E7800 Pure hypercholesterolemia, unspecified: Secondary | ICD-10-CM | POA: Diagnosis not present

## 2018-06-20 DIAGNOSIS — E559 Vitamin D deficiency, unspecified: Secondary | ICD-10-CM

## 2018-06-20 MED ORDER — ROSUVASTATIN CALCIUM 20 MG PO TABS: 20.0000 mg | ORAL_TABLET | Freq: Every day | ORAL | 3 refills | Status: DC

## 2018-06-20 NOTE — Progress Notes (Signed)
Subjective:    Patient ID: Marcus Jensen, male    DOB: 1951/05/28, 67 y.o.   MRN: [id number]  HPI Pt here for follow up and management of chronic medical problems which includes hyperlipidemia. He is taking medication regularly.  Patient is doing well overall.  He has developed a cough with slight congestion and no fever.  He also complains of his hands hurting and he has an appointment with the extremity orthopedic specialist soon.  He also complains of some arthritis in his feet or neuropathy.  Going back to December 2015 he did had LS spine films which show degenerative disc and facet joint changes of the mid and lower lumbar spine.  The patient is pleasant and is having some arthritic symptoms in his feet especially noted when he gets up in the morning.  He has had x-rays of his back as mentioned with degenerative changes.  He is currently doing fairly well with his cervical spine neuropathy.  He has seen the eye doctor.  He may need a new prescription for glasses.  He denies any chest pain pressure tightness or shortness of breath more than usual.  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  He is passing his water well and having occasional nocturia and slow voiding.  He is due to get a colonoscopy in 2020.  He does have a cough nonproductive no fever and we will get a chest x-ray today and a CBC and make sure that all of this is okay.    Patient Active Problem List   Diagnosis Date Noted  . Idiopathic peripheral neuropathy 11/09/2017  . Vitamin D deficiency 12/16/2016  . Chronic allergic rhinitis due to pollen 12/16/2016  . Gastroesophageal reflux disease 12/16/2016  . Palpitations 01/01/2016  . URI, acute 09/16/2015  . BPH (benign prostatic hyperplasia) 06/11/2015  . Hyperlipemia 02/01/2014  . Cholelithiasis 02/01/2014  . Cervical disc disease 02/01/2014  . Spinal stenosis of cervical region  02/01/2014  . Spermatocele 02/01/2014  . Hydrocele, right 02/01/2014  . Umbilical hernia without obstruction and without gangrene 02/01/2014  . HEMORRHAGE OF RECTUM AND ANUS 11/11/2009  . HEARTBURN 11/11/2009   Outpatient Encounter Medications as of 06/20/2018  Medication Sig  . b complex vitamins capsule Take 1 capsule by mouth daily.  . cholecalciferol (VITAMIN D) 1000 UNITS tablet Take 2,000 Units by mouth daily.  . Cyanocobalamin (VITAMIN B 12 PO) Take 1 tablet by mouth daily.  Marland Kitchen gabapentin (NEURONTIN) 400 MG capsule Take 400 mg by mouth 3 (three) times daily.   Marland Kitchen glucosamine-chondroitin 500-400 MG tablet Take 2 tablets by mouth 2 (two) times daily.   . Omega-3 Fatty Acids (FISH OIL) 1000 MG CAPS Take 2 capsules by mouth daily.  . rosuvastatin (CRESTOR) 20 MG tablet Take 1 tablet (20 mg total) by mouth daily.  . cyclobenzaprine (FLEXERIL) 10 MG tablet Take 0.5-1 tablets (5-10 mg total) by mouth 3 (three) times daily as needed for muscle spasms. (Patient not taking: Reported on 06/20/2018)  . TURMERIC PO Take 1 tablet by mouth daily.   No facility-administered encounter medications on file as of 06/20/2018.       Review of Systems  Constitutional: Negative.   HENT: Negative.   Eyes: Negative.   Respiratory: Positive for cough (chest - dry).   Cardiovascular: Negative.   Gastrointestinal: Negative.   Endocrine: Negative.   Genitourinary: Negative.  Musculoskeletal: Positive for arthralgias (hands and feet ).  Skin: Negative.   Allergic/Immunologic: Negative.   Neurological: Negative.   Hematological: Negative.   Psychiatric/Behavioral: Negative.        Objective:   Physical Exam  Constitutional: He is oriented to person, place, and time. He appears well-developed and well-nourished. No distress.  The patient is pleasant and relaxed and generally speaking is not a complainer.  HENT:  Head: Normocephalic and atraumatic.  Right Ear: External ear normal.  Left Ear: External  ear normal.  Nose: Nose normal.  Mouth/Throat: Oropharynx is clear and moist. No oropharyngeal exudate.  Slight nasal congestion left greater than right  Eyes: Pupils are equal, round, and reactive to light. Conjunctivae and EOM are normal. Right eye exhibits no discharge. Left eye exhibits no discharge. No scleral icterus.  Neck: Normal range of motion. Neck supple. No thyromegaly present.  No bruits thyromegaly or anterior cervical adenopathy  Cardiovascular: Normal rate, regular rhythm, normal heart sounds and intact distal pulses.  No murmur heard. The heart is regular at 60/min with good pedal pulses  Pulmonary/Chest: Effort normal and breath sounds normal. No respiratory distress. He has no wheezes. He has no rales. He exhibits no tenderness.  Clear anteriorly and posteriorly but slightly dry cough, no axillary adenopathy chest wall masses or chest wall tenderness  Abdominal: Soft. Bowel sounds are normal. He exhibits no mass. There is no tenderness. There is no rebound and no guarding.  No liver or spleen enlargement no epigastric tenderness no inguinal adenopathy masses or bruits  Musculoskeletal: Normal range of motion. He exhibits no edema.  Lymphadenopathy:    He has no cervical adenopathy.  Neurological: He is alert and oriented to person, place, and time. He has normal reflexes. No cranial nerve deficit.  Skin: Skin is warm and dry. No rash noted.  Psychiatric: He has a normal mood and affect. His behavior is normal. Judgment and thought content normal.  Normal mood affect and behavior  Nursing note and vitals reviewed.  BP 136/79 (BP Location: Left Arm)   Pulse 62   Temp 97.7 F (36.5 C) (Oral)   Ht 6' 1" (1.854 m)   Wt 214 lb (97.1 kg)   BMI 28.23 kg/m         Assessment & Plan:  1. Benign prostatic hyperplasia, unspecified whether lower urinary tract symptoms present -The patient does have some nocturia and slowness with voiding but this is not changed  significantly. - CBC with Differential/Platelet  2. Vitamin D deficiency -Continue with vitamin D replacement pending results of lab work - CBC with Differential/Platelet - VITAMIN D 25 Hydroxy (Vit-D Deficiency, Fractures)  3. Pure hypercholesterolemia -Continue with Crestor and aggressive therapeutic lifestyle changes pending results of lab work - BMP8+EGFR - CBC with Differential/Platelet - Lipid panel - DG Chest 2 View; Future  4. Gastroesophageal reflux disease, esophagitis presence not specified -The patient wants to try some meloxicam and he was reminded he might need to protect his stomach with a ranitidine before taking the meloxicam to make sure he does not flare up his esophagitis. - CBC with Differential/Platelet - Hepatic function panel  5. Cough -Mucinex maximum strength 1 twice daily with a large glass of water for cough and congestion - DG Chest 2 View; Future  6. Idiopathic peripheral neuropathy -Continue with gabapentin and follow-up with orthopedist as planned -The patient complains of tingling in his feet.  This is most likely secondary to the arthritis in his low back.  No   orders of the defined types were placed in this encounter.  Patient Instructions                       Medicare Annual Wellness Visit  [place] and the medical providers at [facility] strive to bring you the best medical care.  In doing so we not only want to address your current medical conditions and concerns but also to detect new conditions early and prevent illness, disease and health-related problems.    Medicare offers a yearly Wellness Visit which allows our clinical staff to assess your need for preventative services including immunizations, lifestyle education, counseling to decrease risk of preventable diseases and screening for fall risk and other medical concerns.    This visit is provided free of charge (no copay) for all Medicare recipients. The  clinical pharmacists at [facility] have begun to conduct these Wellness Visits which will also include a thorough review of all your medications.    As you primary medical provider recommend that you make an appointment for your Annual Wellness Visit if you have not done so already this year.  You may set up this appointment before you leave today or you may call back ([phone]) and schedule an appointment.  Please make sure when you call that you mention that you are scheduling your Annual Wellness Visit with the clinical pharmacist so that the appointment may be made for the proper length of time.     Continue current medications. Continue good therapeutic lifestyle changes which include good diet and exercise. Fall precautions discussed with patient. If an FOBT was given today- please return it to our front desk. If you are over 50 years old - you may need Prevnar 13 or the adult Pneumonia vaccine.  **Flu shots are available--- please call and schedule a FLU-CLINIC appointment**  After your visit with us today you will receive a survey in the mail or online from [employer] regarding your care with us. Please take a moment to fill this out. Your feedback is very important to us as you can help us better understand your patient needs as well as improve your experience and satisfaction. WE CARE ABOUT YOU!!!   You should remember you will need to get a colonoscopy in November 2020 by Dr. Jacobs We will continue to check your fecal occult blood test yearly We will call you with your lab work results as soon as the results become available Try some of the meloxicam  and start with 15 mg daily but try to reduce it to 7-1/2 mg daily and always remember to take this after eating.  You may need to take some ranitidine prior to the meal and take the meloxicam after the meal.  If this works please call us back and we will call a prescription in for this instead of taking the  Naprosyn 200 mg that you are currently taking. Avoid irritating environments Try Mucinex maximum strength 1 twice daily with a large glass of water and see if this will help the cough and congestion that you are complaining with today. Most importantly continue to drink plenty of water and stay well-hydrated     Patient Instructions                       Medicare Annual Wellness Visit  [place] and the medical providers at [facility] strive to bring you the   best medical care.  In doing so we not only want to address your current medical conditions and concerns but also to detect new conditions early and prevent illness, disease and health-related problems.    Medicare offers a yearly Wellness Visit which allows our clinical staff to assess your need for preventative services including immunizations, lifestyle education, counseling to decrease risk of preventable diseases and screening for fall risk and other medical concerns.    This visit is provided free of charge (no copay) for all Medicare recipients. The clinical pharmacists at [facility] have begun to conduct these Wellness Visits which will also include a thorough review of all your medications.    As you primary medical provider recommend that you make an appointment for your Annual Wellness Visit if you have not done so already this year.  You may set up this appointment before you leave today or you may call back ([phone]) and schedule an appointment.  Please make sure when you call that you mention that you are scheduling your Annual Wellness Visit with the clinical pharmacist so that the appointment may be made for the proper length of time.     Continue current medications. Continue good therapeutic lifestyle changes which include good diet and exercise. Fall precautions discussed with patient. If an FOBT was given today- please return it to our front desk. If you are over 50  years old - you may need Prevnar 13 or the adult Pneumonia vaccine.  **Flu shots are available--- please call and schedule a FLU-CLINIC appointment**  After your visit with us today you will receive a survey in the mail or online from [employer] regarding your care with us. Please take a moment to fill this out. Your feedback is very important to us as you can help us better understand your patient needs as well as improve your experience and satisfaction. WE CARE ABOUT YOU!!!   You should remember you will need to get a colonoscopy in November 2020 by Dr. Jacobs We will continue to check your fecal occult blood test yearly We will call you with your lab work results as soon as the results become available Try some of the meloxicam  and start with 15 mg daily but try to reduce it to 7-1/2 mg daily and always remember to take this after eating.  You may need to take some ranitidine prior to the meal and take the meloxicam after the meal.  If this works please call us back and we will call a prescription in for this instead of taking the Naprosyn 200 mg that you are currently taking. Avoid irritating environments Try Mucinex maximum strength 1 twice daily with a large glass of water and see if this will help the cough and congestion that you are complaining with today. Most importantly continue to drink plenty of water and stay well-hydrated     Don W. Moore MD   

## 2018-06-20 NOTE — Patient Instructions (Addendum)
Medicare Annual Wellness Visit  [place] and the medical providers at [facility] strive to bring you the best medical care.  In doing so we not only want to address your current medical conditions and concerns but also to detect new conditions early and prevent illness, disease and health-related problems.    Medicare offers a yearly Wellness Visit which allows our clinical staff to assess your need for preventative services including immunizations, lifestyle education, counseling to decrease risk of preventable diseases and screening for fall risk and other medical concerns.    This visit is provided free of charge (no copay) for all Medicare recipients. The clinical pharmacists at [facility] have begun to conduct these Wellness Visits which will also include a thorough review of all your medications.    As you primary medical provider recommend that you make an appointment for your Annual Wellness Visit if you have not done so already this year.  You may set up this appointment before you leave today or you may call back ([phone]) and schedule an appointment.  Please make sure when you call that you mention that you are scheduling your Annual Wellness Visit with the clinical pharmacist so that the appointment may be made for the proper length of time.     Continue current medications. Continue good therapeutic lifestyle changes which include good diet and exercise. Fall precautions discussed with patient. If an FOBT was given today- please return it to our front desk. If you are over 26 years old - you may need Prevnar 52 or the adult Pneumonia vaccine.  **Flu shots are available--- please call and schedule a FLU-CLINIC appointment**  After your visit with [place] today you will receive a survey in the mail or online from [employer] regarding your care with [place]. Please take a moment to fill this out. Your feedback is very  important to [place] as you can help [place] better understand your patient needs as well as improve your experience and satisfaction. WE CARE ABOUT YOU!!!   You should remember you will need to get a colonoscopy in November 2020 by Dr. Ardis Hughs We will continue to check your fecal occult blood test yearly We will call you with your lab work results as soon as the results become available Try some of the meloxicam  and start with 15 mg daily but try to reduce it to 7-1/2 mg daily and always remember to take this after eating.  You may need to take some ranitidine prior to the meal and take the meloxicam after the meal.  If this works please call us back and we will call a prescription in for this instead of taking the Naprosyn 200 mg that you are currently taking. Avoid irritating environments Try Mucinex maximum strength 1 twice daily with a large glass of water and see if this will help the cough and congestion that you are complaining with today. Most importantly continue to drink plenty of water and stay well-hydrated

## 2018-06-20 NOTE — Addendum Note (Signed)
Addended by: Zannie Cove on: 06/20/2018 09:37 AM   Modules accepted: Orders

## 2018-06-21 LAB — CBC WITH DIFFERENTIAL/PLATELET
BASOS: 0 %
Basophils Absolute: 0 10*3/uL (ref 0.0–0.2)
EOS (ABSOLUTE): 0.1 10*3/uL (ref 0.0–0.4)
EOS: 2 %
HEMATOCRIT: 39.6 % (ref 37.5–51.0)
Hemoglobin: 14.2 g/dL (ref 13.0–17.7)
IMMATURE GRANULOCYTES: 0 %
Immature Grans (Abs): 0 10*3/uL (ref 0.0–0.1)
Lymphocytes Absolute: 0.9 10*3/uL (ref 0.7–3.1)
Lymphs: 28 %
MCH: 31.8 pg (ref 26.6–33.0)
MCHC: 35.9 g/dL — ABNORMAL HIGH (ref 31.5–35.7)
MCV: 89 fL (ref 79–97)
Monocytes Absolute: 0.3 10*3/uL (ref 0.1–0.9)
Monocytes: 8 %
NEUTROS PCT: 62 %
Neutrophils Absolute: 2 10*3/uL (ref 1.4–7.0)
Platelets: 173 10*3/uL (ref 150–450)
RBC: 4.47 x10E6/uL (ref 4.14–5.80)
RDW: 12.9 % (ref 12.3–15.4)
WBC: 3.3 10*3/uL — ABNORMAL LOW (ref 3.4–10.8)

## 2018-06-21 LAB — BMP8+EGFR
BUN / CREAT RATIO: 20 (ref 10–24)
BUN: 19 mg/dL (ref 8–27)
CO2: 22 mmol/L (ref 20–29)
CREATININE: 0.97 mg/dL (ref 0.76–1.27)
Calcium: 9.5 mg/dL (ref 8.6–10.2)
Chloride: 102 mmol/L (ref 96–106)
GFR, EST AFRICAN AMERICAN: 94 mL/min/{1.73_m2} (ref >59)
GFR, EST NON AFRICAN AMERICAN: 82 mL/min/{1.73_m2} (ref >59)
Glucose: 98 mg/dL (ref 65–99)
Potassium: 4.1 mmol/L (ref 3.5–5.2)
SODIUM: 139 mmol/L (ref 134–144)

## 2018-06-21 LAB — LIPID PANEL
Chol/HDL Ratio: 2.8 ratio (ref 0.0–5.0)
Cholesterol, Total: 170 mg/dL (ref 100–199)
HDL: 60 mg/dL (ref >39)
LDL Calculated: 99 mg/dL (ref 0–99)
TRIGLYCERIDES: 57 mg/dL (ref 0–149)
VLDL CHOLESTEROL CAL: 11 mg/dL (ref 5–40)

## 2018-06-21 LAB — HEPATIC FUNCTION PANEL
ALBUMIN: 4.5 g/dL (ref 3.6–4.8)
ALK PHOS: 67 IU/L (ref 39–117)
ALT: 19 IU/L (ref 0–44)
AST: 29 IU/L (ref 0–40)
BILIRUBIN TOTAL: 0.7 mg/dL (ref 0.0–1.2)
Bilirubin, Direct: 0.21 mg/dL (ref 0.00–0.40)
Total Protein: 6.6 g/dL (ref 6.0–8.5)

## 2018-06-21 LAB — VITAMIN D 25 HYDROXY (VIT D DEFICIENCY, FRACTURES): Vit D, 25-Hydroxy: 59.5 ng/mL (ref 30.0–100.0)

## 2018-06-21 LAB — VITAMIN B12: VITAMIN B 12: 1836 pg/mL — AB (ref 232–1245)

## 2018-07-27 DIAGNOSIS — G5601 Carpal tunnel syndrome, right upper limb: Secondary | ICD-10-CM | POA: Diagnosis not present

## 2018-07-27 DIAGNOSIS — G5602 Carpal tunnel syndrome, left upper limb: Secondary | ICD-10-CM | POA: Diagnosis not present

## 2018-08-01 DIAGNOSIS — G5601 Carpal tunnel syndrome, right upper limb: Secondary | ICD-10-CM | POA: Diagnosis not present

## 2018-08-01 DIAGNOSIS — G5602 Carpal tunnel syndrome, left upper limb: Secondary | ICD-10-CM | POA: Diagnosis not present

## 2018-08-01 DIAGNOSIS — H527 Unspecified disorder of refraction: Secondary | ICD-10-CM | POA: Diagnosis not present

## 2018-09-10 DIAGNOSIS — H5213 Myopia, bilateral: Secondary | ICD-10-CM | POA: Diagnosis not present

## 2018-10-06 ENCOUNTER — Ambulatory Visit (INDEPENDENT_AMBULATORY_CARE_PROVIDER_SITE_OTHER): Payer: Medicare Other

## 2018-10-06 DIAGNOSIS — Z23 Encounter for immunization: Secondary | ICD-10-CM

## 2018-11-09 DIAGNOSIS — B351 Tinea unguium: Secondary | ICD-10-CM | POA: Diagnosis not present

## 2018-11-09 DIAGNOSIS — Z85828 Personal history of other malignant neoplasm of skin: Secondary | ICD-10-CM | POA: Diagnosis not present

## 2018-11-09 DIAGNOSIS — L570 Actinic keratosis: Secondary | ICD-10-CM | POA: Diagnosis not present

## 2018-11-22 NOTE — Progress Notes (Signed)
Acute Office Visit  Subjective:    Patient ID: Marcus Jensen, male    DOB: 09-14-1951, 67 y.o.   MRN: [id number]  No chief complaint on file.   HPI Patient is in today for abdominal pain.  The patient's wife was in the office yesterday and she had some concerns about her husband because of his complaints with abdominal pain and some chest discomfort.  He did have a stress test in 2015 but no interpretation was placed on this although the report reads as an adequate stress test at that time.  The chest discomfort resolved after a couple weeks.  His vital signs are stable.  The abdominal pain has been persistent for about a month.  The patient does have hyperlipidemia.  His previous colonoscopy was done in December 2010.  At that time he did have some small internal and external hemorrhoids otherwise normal exam and no polyps observed.  He did have an endoscopy also in that same timeframe which did reveal erosive reflux esophagitis.  The endoscopy and colonoscopy were done by Dr. Ardis Hughs.  Family history is positive for CVA and both of his parents.  The patient has a brother that had liver cancer and is deceased.  The patient's current medicines include vitamin D B12 by mouth gabapentin omega-3 fatty acids and Crestor along with Flexeril.  The patient is pleasant and relates his history well.  He has had this abdominal and right-sided chest pain for about a month.  The chest pain seems to be better and is not associated with any physical activity but the abdominal pain and aching has continued.  We did review the last colonoscopy and endoscopy be with him and he has received a copy of that report for his records.  He does not have any more shortness of breath but did have some with the chest discomfort but that has resolved.  He does not describe any heartburn but just some upper and right-sided abdominal pain and he does have a history of gallstones.  He was taken naproxen and stop this and the abdominal  pain he did get slightly better.  He has not seen any blood in the stool and he has not had any change in his bowel habits.  He does have nocturia 2-3 times nightly.  Past Medical History:  Diagnosis Date  . Arthritis    Cervical disc disease  . Hyperlipidemia   . Peripheral neuropathy   . PONV (postoperative nausea and vomiting)     Past Surgical History:  Procedure Laterality Date  . HAND SURGERY Left    fractured metacarpal 5th digit  . HERNIA REPAIR    . INGUINAL HERNIA REPAIR Right 08/21/2016   Procedure: RIGHT INGUINAL HERNIORRHAPHY WITH MESH;  Surgeon: Aviva Signs, MD;  Location: [facility];  Service: General;  Laterality: Right;  . KNEE SURGERY Bilateral    arthroscopy  . SKIN BIOPSY     left side nose    Family History  Problem Relation Age of Onset  . CVA Mother 82  . CVA Father   . Cancer Brother        liver    Social History   Socioeconomic History  . Marital status: Married    Spouse name: Not on file  . Number of children: 2  . Years of education: Not on file  . Highest education level: Not on file  Occupational History  . Occupation: Vet  Social Needs  . Financial resource strain: Not on  file  . Food insecurity:    Worry: Not on file    Inability: Not on file  . Transportation needs:    Medical: Not on file    Non-medical: Not on file  Tobacco Use  . Smoking status: Former Smoker    Packs/day: 1.00    Years: 6.00    Pack years: 6.00    Types: Cigarettes    Last attempt to quit: 02/01/1990    Years since quitting: 28.8  . Smokeless tobacco: Former Systems developer    Types: [place] date: 08/20/1987  Substance and Sexual Activity  . Alcohol use: Yes    Alcohol/week: 6.0 standard drinks    Types: 6 Glasses of wine per week  . Drug use: No  . Sexual activity: Yes    Birth control/protection: None  Lifestyle  . Physical activity:    Days per week: Not on file    Minutes per session: Not on file  . Stress: Not on file  Relationships  . Social  connections:    Talks on phone: Not on file    Gets together: Not on file    Attends religious service: Not on file    Active member of club or organization: Not on file    Attends meetings of clubs or organizations: Not on file    Relationship status: Not on file  . Intimate partner violence:    Fear of current or ex partner: Not on file    Emotionally abused: Not on file    Physically abused: Not on file    Forced sexual activity: Not on file  Other Topics Concern  . Not on file  Social History Narrative  . Not on file    Outpatient Medications Prior to Visit  Medication Sig Dispense Refill  . b complex vitamins capsule Take 1 capsule by mouth daily.    . cholecalciferol (VITAMIN D) 1000 UNITS tablet Take 2,000 Units by mouth daily.    . Cyanocobalamin (VITAMIN B 12 PO) Take 1 tablet by mouth daily.    . cyclobenzaprine (FLEXERIL) 10 MG tablet Take 0.5-1 tablets (5-10 mg total) by mouth 3 (three) times daily as needed for muscle spasms. (Patient not taking: Reported on 06/20/2018) 30 tablet 0  . gabapentin (NEURONTIN) 400 MG capsule Take 400 mg by mouth 3 (three) times daily.     Marland Kitchen glucosamine-chondroitin 500-400 MG tablet Take 2 tablets by mouth 2 (two) times daily.     . Omega-3 Fatty Acids (FISH OIL) 1000 MG CAPS Take 2 capsules by mouth daily.    . rosuvastatin (CRESTOR) 20 MG tablet Take 1 tablet (20 mg total) by mouth daily. 90 tablet 3  . TURMERIC PO Take 1 tablet by mouth daily.     No facility-administered medications prior to visit.     No Known Allergies  Review of Systems  Constitutional: Negative for malaise/fatigue.  Cardiovascular: Positive for chest pain.  Gastrointestinal: Positive for abdominal pain (intermitent x 1 mth) and constipation (occasional, normal for pt). Negative for diarrhea, melena, nausea and vomiting.       Objective:    Physical Exam  Constitutional: He is oriented to person, place, and time. He appears well-developed and well-nourished.  No distress.  The patient is pleasant and alert and a good historian regarding his current issues.  HENT:  Head: Normocephalic and atraumatic.  Right Ear: External ear normal.  Left Ear: External ear normal.  Nose: Nose normal.  Mouth/Throat: Oropharynx is clear  and moist. No oropharyngeal exudate.  Some nasal turbinate congestion  Eyes: Pupils are equal, round, and reactive to light. Conjunctivae and EOM are normal. Right eye exhibits no discharge. Left eye exhibits no discharge. No scleral icterus.  Exams are up-to-date  Neck: Normal range of motion. Neck supple. No thyromegaly present.  No bruits thyromegaly or anterior cervical adenopathy  Cardiovascular: Normal rate, regular rhythm and normal heart sounds.  No murmur heard. The heart is regular at 60/min without murmurs and no edema   Pulmonary/Chest: Effort normal and breath sounds normal. No respiratory distress. He has no wheezes. He has no rales. He exhibits no tenderness.  Clear anteriorly and posteriorly with no chest wall tenderness masses and no axillary adenopathy  Abdominal: Soft. Bowel sounds are normal. He exhibits no distension and no mass. There is no tenderness. There is no rebound and no guarding.  No abdominal tenderness organ enlargement bruits or masses.  Patient does have an umbilical hernia that has been there for a good while.  No inguinal adenopathy.  Musculoskeletal: Normal range of motion. He exhibits no edema or tenderness.  Lymphadenopathy:    He has no cervical adenopathy.  Neurological: He is alert and oriented to person, place, and time. He has normal reflexes. No cranial nerve deficit.  Skin: Skin is warm and dry. No rash noted.  Psychiatric: He has a normal mood and affect. His behavior is normal. Judgment and thought content normal.  Nursing note and vitals reviewed.   BP 133/80   Pulse 60   Temp (!) 97.3 F (36.3 C)   Ht '6\' 1"'$  (1.854 m)   Wt 215 lb 6.4 oz (97.7 kg)   BMI 28.42 kg/m    There  were no vitals taken for this visit. Wt Readings from Last 3 Encounters:  06/20/18 214 lb (97.1 kg)  11/09/17 222 lb (100.7 kg)  05/07/17 222 lb (100.7 kg)    Health Maintenance Due  Topic Date Due  . COLON CANCER SCREENING ANNUAL FOBT  09/06/2018    There are no preventive care reminders to display for this patient.   Lab Results  Component Value Date   TSH 0.439 (L) 06/11/2015   Lab Results  Component Value Date   WBC 3.3 (L) 06/20/2018   HGB 14.2 06/20/2018   HCT 39.6 06/20/2018   MCV 89 06/20/2018   PLT 173 06/20/2018   Lab Results  Component Value Date   NA 139 06/20/2018   K 4.1 06/20/2018   CO2 22 06/20/2018   GLUCOSE 98 06/20/2018   BUN 19 06/20/2018   CREATININE 0.97 06/20/2018   BILITOT 0.7 06/20/2018   ALKPHOS 67 06/20/2018   AST 29 06/20/2018   ALT 19 06/20/2018   PROT 6.6 06/20/2018   ALBUMIN 4.5 06/20/2018   CALCIUM 9.5 06/20/2018   ANIONGAP 8 08/19/2016   Lab Results  Component Value Date   CHOL 170 06/20/2018   Lab Results  Component Value Date   HDL 60 06/20/2018   Lab Results  Component Value Date   LDLCALC 99 06/20/2018   Lab Results  Component Value Date   TRIG 57 06/20/2018   Lab Results  Component Value Date   CHOLHDL 2.8 06/20/2018   No results found for: HGBA1C     Assessment & Plan:   Problem List Items Addressed This Visit    None       No orders of the defined types were placed in this encounter.  1. Other chest pain -Arrange  for ETT and get chest x-ray today - EKG 12-Lead - CBC with Differential/Platelet - BMP8+EGFR - Hepatic function panel - US Abdomen Complete; Future - Exercise Tolerance Test; Future - DG Chest 2 View  2. RUQ abdominal pain -Patient has history of gallstones.  We will get ultrasound of abdomen to include the liver -Patient will take Pepcid AC twice daily before breakfast and supper - CBC with Differential/Platelet - BMP8+EGFR - Hepatic function panel - US Abdomen Complete;  Future - Urinalysis, Routine w reflex microscopic  3.  Nocturia -Check urinalysis  4.  GERD -Take Pepcid AC twice daily before eating  Patient Instructions  Patient should continue to drink plenty of fluids He should take Tylenol if needed for aches pains and fever He should take Pepcid AC twice daily before breakfast and supper until he is rechecked He should return the FOBT He is reminded that his next colonoscopy is not due until December 2020 unless something shows up otherwise He is scheduled for an ultrasound of the abdomen  Arrie Senate MD   [place], Renaye Rakers

## 2018-11-23 ENCOUNTER — Ambulatory Visit (INDEPENDENT_AMBULATORY_CARE_PROVIDER_SITE_OTHER): Payer: Medicare Other

## 2018-11-23 ENCOUNTER — Encounter: Payer: Self-pay | Admitting: Family Medicine

## 2018-11-23 ENCOUNTER — Ambulatory Visit (INDEPENDENT_AMBULATORY_CARE_PROVIDER_SITE_OTHER): Payer: Medicare Other | Admitting: Family Medicine

## 2018-11-23 VITALS — BP 133/80 | HR 60 | Temp 97.3°F | Ht 73.0 in | Wt 215.4 lb

## 2018-11-23 DIAGNOSIS — R0789 Other chest pain: Secondary | ICD-10-CM

## 2018-11-23 DIAGNOSIS — R351 Nocturia: Secondary | ICD-10-CM | POA: Diagnosis not present

## 2018-11-23 DIAGNOSIS — R1011 Right upper quadrant pain: Secondary | ICD-10-CM

## 2018-11-23 DIAGNOSIS — K219 Gastro-esophageal reflux disease without esophagitis: Secondary | ICD-10-CM | POA: Diagnosis not present

## 2018-11-23 DIAGNOSIS — R079 Chest pain, unspecified: Secondary | ICD-10-CM | POA: Diagnosis not present

## 2018-11-23 LAB — URINALYSIS, ROUTINE W REFLEX MICROSCOPIC
Bilirubin, UA: NEGATIVE
GLUCOSE, UA: NEGATIVE
KETONES UA: NEGATIVE
Leukocytes, UA: NEGATIVE
Nitrite, UA: NEGATIVE
Protein, UA: NEGATIVE
RBC, UA: NEGATIVE
SPEC GRAV UA: 1.02 (ref 1.005–1.030)
Urobilinogen, Ur: 0.2 mg/dL (ref 0.2–1.0)
pH, UA: 6.5 (ref 5.0–7.5)

## 2018-11-23 LAB — MICROSCOPIC EXAMINATION
Bacteria, UA: NONE SEEN
Epithelial Cells (non renal): NONE SEEN /hpf (ref 0–10)
RBC, UA: NONE SEEN /hpf (ref 0–2)
Renal Epithel, UA: NONE SEEN /hpf

## 2018-11-23 MED ORDER — GABAPENTIN 400 MG PO CAPS: 400.0000 mg | ORAL_CAPSULE | Freq: Three times a day (TID) | ORAL | 3 refills | Status: DC

## 2018-11-23 NOTE — Patient Instructions (Signed)
Patient should continue to drink plenty of fluids He should take Tylenol if needed for aches pains and fever He should take Pepcid AC twice daily before breakfast and supper until he is rechecked He should return the FOBT He is reminded that his next colonoscopy is not due until December 2020 unless something shows up otherwise He is scheduled for an ultrasound of the abdomen

## 2018-11-24 ENCOUNTER — Telehealth: Payer: Self-pay | Admitting: *Deleted

## 2018-11-24 LAB — CBC WITH DIFFERENTIAL/PLATELET
BASOS: 1 %
Basophils Absolute: 0 10*3/uL (ref 0.0–0.2)
EOS (ABSOLUTE): 0.1 10*3/uL (ref 0.0–0.4)
Eos: 4 %
HEMOGLOBIN: 14.6 g/dL (ref 13.0–17.7)
Hematocrit: 43.8 % (ref 37.5–51.0)
IMMATURE GRANS (ABS): 0 10*3/uL (ref 0.0–0.1)
Immature Granulocytes: 0 %
Lymphocytes Absolute: 0.9 10*3/uL (ref 0.7–3.1)
Lymphs: 23 %
MCH: 30.9 pg (ref 26.6–33.0)
MCHC: 33.3 g/dL (ref 31.5–35.7)
MCV: 93 fL (ref 79–97)
MONOCYTES: 10 %
Monocytes Absolute: 0.4 10*3/uL (ref 0.1–0.9)
NEUTROS ABS: 2.4 10*3/uL (ref 1.4–7.0)
Neutrophils: 62 %
Platelets: 218 10*3/uL (ref 150–450)
RBC: 4.72 x10E6/uL (ref 4.14–5.80)
RDW: 11.5 % — ABNORMAL LOW (ref 12.3–15.4)
WBC: 3.9 10*3/uL (ref 3.4–10.8)

## 2018-11-24 LAB — BMP8+EGFR
BUN / CREAT RATIO: 16 (ref 10–24)
BUN: 18 mg/dL (ref 8–27)
CO2: 24 mmol/L (ref 20–29)
CREATININE: 1.13 mg/dL (ref 0.76–1.27)
Calcium: 9.6 mg/dL (ref 8.6–10.2)
Chloride: 100 mmol/L (ref 96–106)
GFR, EST AFRICAN AMERICAN: 78 mL/min/{1.73_m2} (ref >59)
GFR, EST NON AFRICAN AMERICAN: 68 mL/min/{1.73_m2} (ref >59)
Glucose: 93 mg/dL (ref 65–99)
Potassium: 4.3 mmol/L (ref 3.5–5.2)
Sodium: 140 mmol/L (ref 134–144)

## 2018-11-24 LAB — HEPATIC FUNCTION PANEL
ALK PHOS: 83 IU/L (ref 39–117)
ALT: 20 IU/L (ref 0–44)
AST: 27 IU/L (ref 0–40)
Albumin: 4.5 g/dL (ref 3.6–4.8)
Bilirubin Total: 0.6 mg/dL (ref 0.0–1.2)
Bilirubin, Direct: 0.17 mg/dL (ref 0.00–0.40)
Total Protein: 6.8 g/dL (ref 6.0–8.5)

## 2018-11-28 NOTE — Telephone Encounter (Signed)
Pt aware and appt cancelled. °

## 2018-11-28 NOTE — Telephone Encounter (Signed)
Is okay to cancel the appointment with me on 17 December and reschedule for early January

## 2018-11-29 ENCOUNTER — Ambulatory Visit (HOSPITAL_COMMUNITY): Payer: Medicare Other

## 2018-12-01 ENCOUNTER — Telehealth (HOSPITAL_COMMUNITY): Payer: Self-pay

## 2018-12-01 NOTE — Telephone Encounter (Signed)
Encounter complete. 

## 2018-12-06 ENCOUNTER — Ambulatory Visit (HOSPITAL_COMMUNITY)
Admission: RE | Admit: 2018-12-06 | Discharge: 2018-12-06 | Disposition: A | Discharge status: Home / Self Care | Payer: Medicare Other | Source: Ambulatory Visit | Attending: Cardiology | Admitting: Cardiology

## 2018-12-06 ENCOUNTER — Ambulatory Visit (HOSPITAL_COMMUNITY)
Admission: RE | Admit: 2018-12-06 | Discharge: 2018-12-06 | Disposition: A | Discharge status: Home / Self Care | Payer: Medicare Other | Source: Ambulatory Visit | Attending: Family Medicine | Admitting: Family Medicine

## 2018-12-06 ENCOUNTER — Ambulatory Visit: Payer: Medicare Other | Admitting: Family Medicine

## 2018-12-06 DIAGNOSIS — R1011 Right upper quadrant pain: Secondary | ICD-10-CM

## 2018-12-06 DIAGNOSIS — K8020 Calculus of gallbladder without cholecystitis without obstruction: Secondary | ICD-10-CM | POA: Diagnosis not present

## 2018-12-06 DIAGNOSIS — N2882 Megaloureter: Secondary | ICD-10-CM | POA: Insufficient documentation

## 2018-12-06 DIAGNOSIS — R0789 Other chest pain: Secondary | ICD-10-CM | POA: Insufficient documentation

## 2018-12-06 DIAGNOSIS — K7689 Other specified diseases of liver: Secondary | ICD-10-CM

## 2018-12-06 LAB — EXERCISE TOLERANCE TEST
CHL RATE OF PERCEIVED EXERTION: 19
CSEPHR: 105 %
Estimated workload: 11 METS
Exercise duration (min): 9 min
Exercise duration (sec): 36 s
MPHR: 155 {beats}/min
Peak HR: 164 {beats}/min
Rest HR: 62 {beats}/min

## 2018-12-07 ENCOUNTER — Other Ambulatory Visit: Payer: Medicare Other

## 2018-12-07 DIAGNOSIS — Z1211 Encounter for screening for malignant neoplasm of colon: Secondary | ICD-10-CM

## 2018-12-08 LAB — FECAL OCCULT BLOOD, IMMUNOCHEMICAL: Fecal Occult Bld: NEGATIVE

## 2018-12-28 ENCOUNTER — Ambulatory Visit (INDEPENDENT_AMBULATORY_CARE_PROVIDER_SITE_OTHER): Payer: Medicare Other | Admitting: Family Medicine

## 2018-12-28 ENCOUNTER — Encounter: Payer: Self-pay | Admitting: Family Medicine

## 2018-12-28 VITALS — BP 132/74 | HR 59 | Temp 97.0°F | Ht 73.0 in | Wt 226.0 lb

## 2018-12-28 DIAGNOSIS — N2882 Megaloureter: Secondary | ICD-10-CM

## 2018-12-28 DIAGNOSIS — Z0001 Encounter for general adult medical examination with abnormal findings: Secondary | ICD-10-CM

## 2018-12-28 DIAGNOSIS — K219 Gastro-esophageal reflux disease without esophagitis: Secondary | ICD-10-CM | POA: Diagnosis not present

## 2018-12-28 DIAGNOSIS — E559 Vitamin D deficiency, unspecified: Secondary | ICD-10-CM

## 2018-12-28 DIAGNOSIS — M4802 Spinal stenosis, cervical region: Secondary | ICD-10-CM

## 2018-12-28 DIAGNOSIS — R0789 Other chest pain: Secondary | ICD-10-CM

## 2018-12-28 DIAGNOSIS — E7800 Pure hypercholesterolemia, unspecified: Secondary | ICD-10-CM

## 2018-12-28 DIAGNOSIS — Z0000 Encounter for general adult medical examination without abnormal findings: Secondary | ICD-10-CM

## 2018-12-28 DIAGNOSIS — N400 Enlarged prostate without lower urinary tract symptoms: Secondary | ICD-10-CM

## 2018-12-28 DIAGNOSIS — K429 Umbilical hernia without obstruction or gangrene: Secondary | ICD-10-CM

## 2018-12-28 DIAGNOSIS — R351 Nocturia: Secondary | ICD-10-CM

## 2018-12-28 LAB — URINALYSIS, COMPLETE
Bilirubin, UA: NEGATIVE
Glucose, UA: NEGATIVE
Ketones, UA: NEGATIVE
Leukocytes, UA: NEGATIVE
Nitrite, UA: NEGATIVE
Protein, UA: NEGATIVE
RBC, UA: NEGATIVE
Specific Gravity, UA: 1.025 (ref 1.005–1.030)
Urobilinogen, Ur: 0.2 mg/dL (ref 0.2–1.0)
pH, UA: 5.5 (ref 5.0–7.5)

## 2018-12-28 LAB — MICROSCOPIC EXAMINATION
Bacteria, UA: NONE SEEN
Epithelial Cells (non renal): NONE SEEN /hpf (ref 0–10)
RBC, UA: NONE SEEN /hpf (ref 0–2)
Renal Epithel, UA: NONE SEEN /hpf
WBC, UA: NONE SEEN /hpf (ref 0–5)

## 2018-12-28 NOTE — Patient Instructions (Addendum)
Medicare Annual Wellness Visit  [facility] and the medical providers at [facility] strive to bring you the best medical care.  In doing so we not only want to address your current medical conditions and concerns but also to detect new conditions early and prevent illness, disease and health-related problems.    Medicare offers a yearly Wellness Visit which allows our clinical staff to assess your need for preventative services including immunizations, lifestyle education, counseling to decrease risk of preventable diseases and screening for fall risk and other medical concerns.    This visit is provided free of charge (no copay) for all Medicare recipients. The clinical pharmacists at [facility] have begun to conduct these Wellness Visits which will also include a thorough review of all your medications.    As you primary medical provider recommend that you make an appointment for your Annual Wellness Visit if you have not done so already this year.  You may set up this appointment before you leave today or you may call back ([phone]) and schedule an appointment.  Please make sure when you call that you mention that you are scheduling your Annual Wellness Visit with the clinical pharmacist so that the appointment may be made for the proper length of time.    Continue current medications. Continue good therapeutic lifestyle changes which include good diet and exercise. Fall precautions discussed with patient. If an FOBT was given today- please return it to our front desk. If you are over 26 years old - you may need Prevnar 36 or the adult Pneumonia vaccine.  **Flu shots are available--- please call and schedule a FLU-CLINIC appointment**  After your visit with [place] today you will receive a survey in the mail or online from [employer] regarding your care with [place]. Please take a moment to fill this out. Your feedback is very  important to [place] as you can help [place] better understand your patient needs as well as improve your experience and satisfaction. WE CARE ABOUT YOU!!!   We will arrange for the patient to have an appointment with a urologist to follow-up on the right ureteral dilatation. The patient is reminded that he will need a colonoscopy in December 2020 because that is a 10-year callback time. A recent FOBT was negative. Patient is reminded that he has gallstones in the right upper quadrant and that if he develops increasing pain in the right upper quadrant that he may need to see a surgeon about this. He should continue to be careful and asked his age and avoid falls and activity which may contribute to falls. Follow-up with eye exams yearly

## 2018-12-28 NOTE — Progress Notes (Signed)
Subjective:    Patient ID: Marcus Jensen, male    DOB: 18-Mar-1951, 68 y.o.   MRN: [id number]  HPI Patient is here today for annual wellness exam and follow up of chronic medical problems which includes hyperlipidemia. He is taking medications regularly.  Patient is doing well overall and his right upper quadrant discomfort seems to be much better with taking the Pepcid AC.  He did have an ultrasound of the abdomen which showed gallstones that he was aware of.  It did show a right ureteral dilatation and the urine was clear of infection and red blood cells.  A CT urogram was recommended but the patient wanted to defer this.  After further discussion during the visit today we will arrange for him to see the urologist to follow-up on this and has a copy of the report.  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  He has some left calf numbness and swelling he does have varicosities here.  He has gained some weight since the last visit and says he can correct that.  Overall.  The patient denies any specific complaints including chest pain abdominal pain nausea vomiting diarrhea blood in the stool or black tarry bowel movements.  He is passing his water with out problems.  The stress test results were reviewed and these were good for the patient.     Patient Active Problem List   Diagnosis Date Noted  . Idiopathic peripheral neuropathy 11/09/2017  . Vitamin D deficiency 12/16/2016  . Chronic allergic rhinitis due to pollen 12/16/2016  . Gastroesophageal reflux disease 12/16/2016  . Palpitations 01/01/2016  . URI, acute 09/16/2015  . BPH (benign prostatic hyperplasia) 06/11/2015  . Hyperlipemia 02/01/2014  . Cholelithiasis 02/01/2014  . Cervical disc disease 02/01/2014  . Spinal stenosis of cervical region 02/01/2014  . Spermatocele 02/01/2014  . Hydrocele, right 02/01/2014  . Umbilical hernia without obstruction and without  gangrene 02/01/2014  . HEMORRHAGE OF RECTUM AND ANUS 11/11/2009  . HEARTBURN 11/11/2009   Outpatient Encounter Medications as of 12/28/2018  Medication Sig  . b complex vitamins capsule Take 1 capsule by mouth daily.  . cholecalciferol (VITAMIN D) 1000 UNITS tablet Take 2,000 Units by mouth daily.  . Cyanocobalamin (VITAMIN B 12 PO) Take 1 tablet by mouth daily.  Marland Kitchen gabapentin (NEURONTIN) 400 MG capsule Take 1 capsule (400 mg total) by mouth 3 (three) times daily.  Marland Kitchen glucosamine-chondroitin 500-400 MG tablet Take 2 tablets by mouth 2 (two) times daily.   . Omega-3 Fatty Acids (FISH OIL) 1000 MG CAPS Take 2 capsules by mouth daily.  . rosuvastatin (CRESTOR) 20 MG tablet Take 1 tablet (20 mg total) by mouth daily.  . [DISCONTINUED] cyclobenzaprine (FLEXERIL) 10 MG tablet Take 0.5-1 tablets (5-10 mg total) by mouth 3 (three) times daily as needed for muscle spasms. (Patient not taking: Reported on 06/20/2018)  . [DISCONTINUED] TURMERIC PO Take 1 tablet by mouth daily.   No facility-administered encounter medications on file as of 12/28/2018.      Review of Systems  Constitutional: Negative.   HENT: Negative.   Eyes: Negative.   Respiratory: Negative.   Cardiovascular: Negative.   Gastrointestinal: Positive for abdominal pain (better ).  Endocrine: Negative.   Genitourinary: Negative.   Musculoskeletal: Positive for arthralgias (left rib pain = from fall ).  Skin: Negative.   Allergic/Immunologic: Negative.   Neurological: Positive for numbness (left calf  numbness ).  Hematological: Negative.   Psychiatric/Behavioral: Negative.        Objective:   Physical Exam Vitals signs and nursing note reviewed.  Constitutional:      Appearance: Normal appearance. He is well-developed. He is obese. He is not ill-appearing.  HENT:     Head: Normocephalic and atraumatic.     Comments: Slight nasal turbinate congestion    Right Ear: Tympanic membrane, ear canal and external ear normal. There is no  impacted cerumen.     Left Ear: Tympanic membrane, ear canal and external ear normal. There is no impacted cerumen.     Nose: Congestion present.     Mouth/Throat:     Mouth: Mucous membranes are moist.     Pharynx: Oropharynx is clear. No oropharyngeal exudate.  Eyes:     General: No scleral icterus.       Right eye: No discharge.        Left eye: No discharge.     Extraocular Movements: Extraocular movements intact.     Conjunctiva/sclera: Conjunctivae normal.     Pupils: Pupils are equal, round, and reactive to light.  Neck:     Musculoskeletal: Normal range of motion and neck supple. No neck rigidity.     Thyroid: No thyromegaly.     Vascular: No carotid bruit.     Trachea: No tracheal deviation.  Cardiovascular:     Rate and Rhythm: Normal rate and regular rhythm.     Pulses: Normal pulses.     Heart sounds: Normal heart sounds. No murmur.     Comments: The heart is regular at 72/min Pulmonary:     Effort: Pulmonary effort is normal. No respiratory distress.     Breath sounds: Normal breath sounds. No wheezing or rales.     Comments: Clear anteriorly and posteriorly and no axillary adenopathy slight chest wall tenderness secondary to contusions from recent fall Chest:     Chest wall: Tenderness present.  Abdominal:     General: Abdomen is flat. Bowel sounds are normal. There is no distension.     Palpations: Abdomen is soft. There is no mass.     Tenderness: There is abdominal tenderness. There is no guarding or rebound.     Comments: Small umbilical hernia and generalized abdominal tenderness with contusion secondary to fall  Genitourinary:    Penis: Normal.      Scrotum/Testes: Normal.     Rectum: Normal.     Comments: The prostate is slightly enlarged with the right being slightly larger than the left but smooth with no lumps or masses.  There were no rectal masses.  The external genitalia were within normal limits other than a hydrocele being present on the right side.   The patient is aware of this. Musculoskeletal: Normal range of motion.        General: No tenderness.     Left lower leg: Edema present.     Comments: Varicosities with minimal left lower extremity edema  Lymphadenopathy:     Cervical: No cervical adenopathy.  Skin:    General: Skin is warm and dry.     Coloration: Skin is not pale.     Findings: Bruising present. No erythema or rash.     Comments: Bruising present on the left side of the abdomen and the left chest wall.  Neurological:     General: No focal deficit present.     Mental Status: He is alert and oriented to person, place, and time.  Mental status is at baseline.     Cranial Nerves: No cranial nerve deficit.     Deep Tendon Reflexes: Reflexes are normal and symmetric.  Psychiatric:        Mood and Affect: Mood normal.        Behavior: Behavior normal.        Thought Content: Thought content normal.        Judgment: Judgment normal.     Comments: Mood affect and behavior are all normal for this patient     BP (!) 144/82 (BP Location: Left Arm)   Pulse (!) 59   Temp (!) 97 F (36.1 C) (Oral)   Ht _0  (1.854 m)   Wt 226 lb (102.5 kg)   BMI 29.82 kg/m        Assessment & Plan:  1. Annual physical exam -Patient recently had a normal ETT. -He does have a dilated ureter on the right.  We will arrange for him to see the urologist about this. - CBC with Differential/Platelet - BMP8+EGFR - Lipid panel - VITAMIN D 25 Hydroxy (Vit-D Deficiency, Fractures) - Hepatic function panel - Thyroid Panel With TSH - PSA, total and free - Urinalysis, Complete  2. Vitamin D deficiency -Continue current treatment pending results of lab work - CBC with Differential/Platelet - VITAMIN D 25 Hydroxy (Vit-D Deficiency, Fractures)  3. Pure hypercholesterolemia -Continue aggressive therapeutic lifestyle changes and current treatment pending results of lab work - CBC with Differential/Platelet - BMP8+EGFR - Lipid panel -  Hepatic function panel  4. Gastroesophageal reflux disease, esophagitis presence not specified -Continue with Pepcid AC and avoid NSAIDs as much as possible - CBC with Differential/Platelet - Hepatic function panel  5. Benign prostatic hyperplasia, unspecified whether lower urinary tract symptoms present -Patient does have nocturia and slow voiding but is okay with this currently.  Prostate is slightly enlarged but no lumps or masses - CBC with Differential/Platelet - PSA, total and free - Urinalysis, Complete  6. Dilation of right ureter - Ambulatory referral to Urology  7. Nocturia -Secondary to BPH  8. Umbilical hernia without obstruction and without gangrene -Currently no problems with this  9. Cervical stenosis of spine -This is an ongoing problem with degenerative cervical disc disease  10. Other chest pain -This is due to the recent fall and chest contusion.  Patient Instructions                       Medicare Annual Wellness Visit  [facility] and the medical providers at [facility] strive to bring you the best medical care.  In doing so we not only want to address your current medical conditions and concerns but also to detect new conditions early and prevent illness, disease and health-related problems.    Medicare offers a yearly Wellness Visit which allows our clinical staff to assess your need for preventative services including immunizations, lifestyle education, counseling to decrease risk of preventable diseases and screening for fall risk and other medical concerns.    This visit is provided free of charge (no copay) for all Medicare recipients. The clinical pharmacists at [facility] have begun to conduct these Wellness Visits which will also include a thorough review of all your medications.    As you primary medical provider recommend that you make an appointment for your Annual Wellness Visit if you have not done  so already this year.  You may set up this appointment before  you leave today or you may call back ([phone]) and schedule an appointment.  Please make sure when you call that you mention that you are scheduling your Annual Wellness Visit with the clinical pharmacist so that the appointment may be made for the proper length of time.    Continue current medications. Continue good therapeutic lifestyle changes which include good diet and exercise. Fall precautions discussed with patient. If an FOBT was given today- please return it to our front desk. If you are over 80 years old - you may need Prevnar 18 or the adult Pneumonia vaccine.  **Flu shots are available--- please call and schedule a FLU-CLINIC appointment**  After your visit with [place] today you will receive a survey in the mail or online from [employer] regarding your care with [place]. Please take a moment to fill this out. Your feedback is very important to [place] as you can help [place] better understand your patient needs as well as improve your experience and satisfaction. WE CARE ABOUT YOU!!!   We will arrange for the patient to have an appointment with a urologist to follow-up on the right ureteral dilatation. The patient is reminded that he will need a colonoscopy in December 2020 because that is a 10-year callback time. A recent FOBT was negative. Patient is reminded that he has gallstones in the right upper quadrant and that if he develops increasing pain in the right upper quadrant that he may need to see a surgeon about this. He should continue to be careful and asked his age and avoid falls and activity which may contribute to falls. Follow-up with eye exams yearly  Arrie Senate MD

## 2018-12-29 LAB — HEPATIC FUNCTION PANEL
ALT: 25 IU/L (ref 0–44)
AST: 29 IU/L (ref 0–40)
Albumin: 4.6 g/dL (ref 3.6–4.8)
Alkaline Phosphatase: 86 IU/L (ref 39–117)
BILIRUBIN, DIRECT: 0.19 mg/dL (ref 0.00–0.40)
Bilirubin Total: 0.5 mg/dL (ref 0.0–1.2)
Total Protein: 6.6 g/dL (ref 6.0–8.5)

## 2018-12-29 LAB — CBC WITH DIFFERENTIAL/PLATELET
Basophils Absolute: 0 10*3/uL (ref 0.0–0.2)
Basos: 1 %
EOS (ABSOLUTE): 0.1 10*3/uL (ref 0.0–0.4)
Eos: 3 %
Hematocrit: 40.2 % (ref 37.5–51.0)
Hemoglobin: 13.9 g/dL (ref 13.0–17.7)
Immature Grans (Abs): 0 10*3/uL (ref 0.0–0.1)
Immature Granulocytes: 0 %
Lymphocytes Absolute: 1 10*3/uL (ref 0.7–3.1)
Lymphs: 27 %
MCH: 31.4 pg (ref 26.6–33.0)
MCHC: 34.6 g/dL (ref 31.5–35.7)
MCV: 91 fL (ref 79–97)
Monocytes Absolute: 0.4 10*3/uL (ref 0.1–0.9)
Monocytes: 10 %
Neutrophils Absolute: 2.3 10*3/uL (ref 1.4–7.0)
Neutrophils: 59 %
PLATELETS: 193 10*3/uL (ref 150–450)
RBC: 4.43 x10E6/uL (ref 4.14–5.80)
RDW: 11.8 % (ref 11.6–15.4)
WBC: 3.9 10*3/uL (ref 3.4–10.8)

## 2018-12-29 LAB — BMP8+EGFR
BUN / CREAT RATIO: 16 (ref 10–24)
BUN: 17 mg/dL (ref 8–27)
CO2: 25 mmol/L (ref 20–29)
Calcium: 9.2 mg/dL (ref 8.6–10.2)
Chloride: 102 mmol/L (ref 96–106)
Creatinine, Ser: 1.08 mg/dL (ref 0.76–1.27)
GFR calc Af Amer: 83 mL/min/{1.73_m2} (ref >59)
GFR calc non Af Amer: 72 mL/min/{1.73_m2} (ref >59)
Glucose: 96 mg/dL (ref 65–99)
Potassium: 4.3 mmol/L (ref 3.5–5.2)
SODIUM: 142 mmol/L (ref 134–144)

## 2018-12-29 LAB — LIPID PANEL
Chol/HDL Ratio: 2.4 ratio (ref 0.0–5.0)
Cholesterol, Total: 164 mg/dL (ref 100–199)
HDL: 67 mg/dL (ref >39)
LDL Calculated: 88 mg/dL (ref 0–99)
Triglycerides: 44 mg/dL (ref 0–149)
VLDL Cholesterol Cal: 9 mg/dL (ref 5–40)

## 2018-12-29 LAB — THYROID PANEL WITH TSH
FREE THYROXINE INDEX: 2 (ref 1.2–4.9)
T3 Uptake Ratio: 26 % (ref 24–39)
T4, Total: 7.7 ug/dL (ref 4.5–12.0)
TSH: 0.975 u[IU]/mL (ref 0.450–4.500)

## 2018-12-29 LAB — PSA, TOTAL AND FREE
PSA, Free Pct: 30.5 %
PSA, Free: 0.67 ng/mL
Prostate Specific Ag, Serum: 2.2 ng/mL (ref 0.0–4.0)

## 2018-12-29 LAB — VITAMIN D 25 HYDROXY (VIT D DEFICIENCY, FRACTURES): VIT D 25 HYDROXY: 53.3 ng/mL (ref 30.0–100.0)

## 2019-04-04 ENCOUNTER — Telehealth: Payer: Self-pay | Admitting: Family Medicine

## 2019-04-04 NOTE — Telephone Encounter (Signed)
Returned patient's call.  He states he has a pinched nerve in his neck that is flared up. He has a few flexeril at home, but would like a refill. Scheduled patient for a telephone visit with Dr. Laurance Flatten 04/05/2019 to discuss.

## 2019-04-05 ENCOUNTER — Other Ambulatory Visit: Payer: Self-pay

## 2019-04-05 ENCOUNTER — Ambulatory Visit (INDEPENDENT_AMBULATORY_CARE_PROVIDER_SITE_OTHER): Payer: Medicare Other | Admitting: Family Medicine

## 2019-04-05 ENCOUNTER — Other Ambulatory Visit: Payer: Self-pay | Admitting: Family Medicine

## 2019-04-05 DIAGNOSIS — G609 Hereditary and idiopathic neuropathy, unspecified: Secondary | ICD-10-CM

## 2019-04-05 DIAGNOSIS — K219 Gastro-esophageal reflux disease without esophagitis: Secondary | ICD-10-CM

## 2019-04-05 DIAGNOSIS — M5090 Cervical disc disorder, unspecified, unspecified cervical region: Secondary | ICD-10-CM

## 2019-04-05 MED ORDER — CYCLOBENZAPRINE HCL 10 MG PO TABS: 10.0000 mg | ORAL_TABLET | Freq: Three times a day (TID) | ORAL | 3 refills | Status: DC | PRN

## 2019-04-05 NOTE — Progress Notes (Signed)
° ° °  Virtual Visit via Telephone Note I connected with@ on 04/05/19 by telephone and verified that I am speaking with the correct person or authorized healthcare agent using two identifiers. ZAVIEN CLUBB is currently located at home and there are no unauthorized people in close proximity. I completed this visit while in a private location in my home .  This visit type was conducted due to national recommendations for restrictions regarding the COVID-19 Pandemic (e.g. social distancing).  This format is felt to be most appropriate for this patient at this time.  All issues noted in this document were discussed and addressed.  No physical exam was performed.    I discussed the limitations, risks, security and privacy concerns of performing an evaluation and management service by telephone and the availability of in person appointments. I also discussed with the patient that there may be a patient responsible charge related to this service. The patient expressed understanding and agreed to proceed.  Current medications, allergies, and pertinent history were reviewed and verified with patient over the phone.  The patient has a history of cervical disc disease.  He has seen the neurosurgeon about this.  No surgery has been done.  The only MRI that I have on the computer dates back to 2013.  There is disc disease at multiple levels and foraminal stenosis and at one level canal stenosis.  He has been taking Naprosyn and Flexeril and this is helped some but he is just not seeming to get better.  He also mentioned had been digging post holes but thinks that this problem mostly came from a position that he was laying and while watching the TV and thinks this got everything stirred up.  We discussed options and we will put him on prednisone 10 taper over 8 days 1 4 times daily for 2 days, 1 3 times daily for 2 days 1 twice daily for 2 days and 1 daily for 2 days.  We will also call in a prescription for Flexeril  10 mg 3 times daily if needed with refills.  He will use warm wet compresses to the area.  He will try to be more careful with his neck positioning and with any overuse of the neck with digging. Spent approximately 15 minutes discussing the neck issues and treatment regimen with the patient. He knows to get back in touch with [place] if the problem gets worse.  1. Cervical disc disease -Patient will take prednisone 10 taper over 8 days along with as needed Flexeril and use warm wet compresses frequently as possible. -He will hold the Naprosyn while he is taking the prednisone  2. Idiopathic peripheral neuropathy -Take prednisone as directed and use warm wet compresses  3. Gastroesophageal reflux disease, esophagitis presence not specified -Continue to protect stomach with Pepcid AC   Time:   Today, I have spent 15 minutes with the patient via telephone discussing the above including Covid precautions.    The above assessment and management plan was discussed with the patient. The patient verbalized understanding of and has agreed to the management plan. Patient is aware to call the clinic if symptoms persist or worsen. Patient is aware when to return to the clinic for a follow-up visit. Patient educated on when it is appropriate to go to the emergency department.    Chipper Herb, MD [address], [place], [place] [address] Ph [phone]   Arrie Senate MD

## 2019-04-05 NOTE — Patient Instructions (Addendum)
Take medication as directed, prednisone 10 taper over 8 days While on this leave off the Naprosyn Use Flexeril as needed Continue to protect stomach with Pepcid AC Use warm wet compresses Avoid heavy lifting pushing pulling if possible

## 2019-04-05 NOTE — Addendum Note (Signed)
Addended by: Zannie Cove on: 04/05/2019 11:47 AM   Modules accepted: Orders

## 2019-05-22 HISTORY — PX: OTHER SURGICAL HISTORY: SHX169

## 2019-05-23 ENCOUNTER — Other Ambulatory Visit: Payer: Self-pay

## 2019-05-23 ENCOUNTER — Ambulatory Visit (INDEPENDENT_AMBULATORY_CARE_PROVIDER_SITE_OTHER): Payer: Medicare Other

## 2019-05-23 ENCOUNTER — Ambulatory Visit (INDEPENDENT_AMBULATORY_CARE_PROVIDER_SITE_OTHER): Payer: Medicare Other | Admitting: Family Medicine

## 2019-05-23 ENCOUNTER — Encounter: Payer: Self-pay | Admitting: Family Medicine

## 2019-05-23 VITALS — BP 133/77 | HR 72 | Temp 97.5°F | Ht 73.0 in | Wt 216.4 lb

## 2019-05-23 DIAGNOSIS — Z7689 Persons encountering health services in other specified circumstances: Secondary | ICD-10-CM | POA: Diagnosis not present

## 2019-05-23 DIAGNOSIS — R600 Localized edema: Secondary | ICD-10-CM | POA: Diagnosis not present

## 2019-05-23 DIAGNOSIS — R221 Localized swelling, mass and lump, neck: Secondary | ICD-10-CM

## 2019-05-23 NOTE — Progress Notes (Signed)
Subjective:  Patient ID: Marcus Jensen, male    DOB: [birth year], 68 y.o.   MRN: [id number]  Chief Complaint:  knot in neck (food seems to stick sometimes)   HPI: Marcus Jensen is a 68 y.o. male presenting on 05/23/2019 for knot in neck (food seems to stick sometimes)   1. Mass in neck   Pt presents today for evaluation of a mass in his neck. He states he noticed this several days ago. States it feels like a "lump" in his throat when he swallows at times. He feels this is not related to the mass. He denies fever, chills, night sweats, weight changes, abnormal bruising or bleeding, cold or heat intolerance, changes in bowel habits, fatigue, or weakness. No myalgias or arthralgias.    Relevant past medical, surgical, family, and social history reviewed and updated as indicated.  Allergies and medications reviewed and updated.   Past Medical History:  Diagnosis Date  . Arthritis    Cervical disc disease  . Hyperlipidemia   . Peripheral neuropathy   . PONV (postoperative nausea and vomiting)     Past Surgical History:  Procedure Laterality Date  . HAND SURGERY Left    fractured metacarpal 5th digit  . HERNIA REPAIR    . INGUINAL HERNIA REPAIR Right 08/21/2016   Procedure: RIGHT INGUINAL HERNIORRHAPHY WITH MESH;  Surgeon: Aviva Signs, MD;  Location: [facility];  Service: General;  Laterality: Right;  . KNEE SURGERY Bilateral    arthroscopy  . SKIN BIOPSY     left side nose    Social History   Socioeconomic History  . Marital status: Married    Spouse name: Not on file  . Number of children: 2  . Years of education: Not on file  . Highest education level: Not on file  Occupational History  . Occupation: Vet  Social Needs  . Financial resource strain: Not on file  . Food insecurity:    Worry: Not on file    Inability: Not on file  . Transportation needs:    Medical: Not on file    Non-medical: Not on file  Tobacco Use  . Smoking status: Former Smoker   Packs/day: 1.00    Years: 6.00    Pack years: 6.00    Types: Cigarettes    Last attempt to quit: 02/01/1990    Years since quitting: 29.3  . Smokeless tobacco: Former Systems developer    Types: [place] date: 08/20/1987  Substance and Sexual Activity  . Alcohol use: Yes    Alcohol/week: 6.0 standard drinks    Types: 6 Glasses of wine per week  . Drug use: No  . Sexual activity: Yes    Birth control/protection: None  Lifestyle  . Physical activity:    Days per week: Not on file    Minutes per session: Not on file  . Stress: Not on file  Relationships  . Social connections:    Talks on phone: Not on file    Gets together: Not on file    Attends religious service: Not on file    Active member of club or organization: Not on file    Attends meetings of clubs or organizations: Not on file    Relationship status: Not on file  . Intimate partner violence:    Fear of current or ex partner: Not on file    Emotionally abused: Not on file    Physically abused: Not on file  Forced sexual activity: Not on file  Other Topics Concern  . Not on file  Social History Narrative  . Not on file    Outpatient Encounter Medications as of 05/23/2019  Medication Sig  . cyclobenzaprine (FLEXERIL) 10 MG tablet Take 1 tablet (10 mg total) by mouth 3 (three) times daily as needed for muscle spasms.  . famotidine (PEPCID) 20 MG tablet Take 10 mg by mouth 2 (two) times daily.  Marland Kitchen gabapentin (NEURONTIN) 400 MG capsule Take 1 capsule (400 mg total) by mouth 3 (three) times daily.  . rosuvastatin (CRESTOR) 20 MG tablet TAKE 1 TABLET BY MOUTH  DAILY  . b complex vitamins capsule Take 1 capsule by mouth daily.  . cholecalciferol (VITAMIN D) 1000 UNITS tablet Take 2,000 Units by mouth daily.  . Cyanocobalamin (VITAMIN B 12 PO) Take 1 tablet by mouth daily.  Marland Kitchen glucosamine-chondroitin 500-400 MG tablet Take 2 tablets by mouth 2 (two) times daily.   . naproxen sodium (ALEVE) 220 MG tablet Take 220 mg by mouth 2 (two)  times daily as needed.  . Omega-3 Fatty Acids (FISH OIL) 1000 MG CAPS Take 2 capsules by mouth daily.   No facility-administered encounter medications on file as of 05/23/2019.     No Known Allergies  Review of Systems  Constitutional: Negative for activity change, appetite change, chills, diaphoresis, fatigue, fever and unexpected weight change.  HENT: Negative for facial swelling, sore throat, trouble swallowing and voice change.   Respiratory: Negative for cough, chest tightness, shortness of breath and stridor.   Cardiovascular: Negative for chest pain, palpitations and leg swelling.  Gastrointestinal: Negative for abdominal distention, abdominal pain, anal bleeding, blood in stool, constipation, diarrhea, nausea, rectal pain and vomiting.  Endocrine: Negative for cold intolerance and heat intolerance.  Musculoskeletal: Negative for arthralgias, myalgias, neck pain and neck stiffness.  Neurological: Negative for dizziness, tremors, seizures, syncope, weakness, numbness and headaches.  Hematological: Does not bruise/bleed easily.  Psychiatric/Behavioral: Negative for confusion.  All other systems reviewed and are negative.       Objective:  BP 133/77   Pulse 72   Temp (!) 97.5 F (36.4 C) (Oral)   Ht _0  (1.854 m)   Wt 216 lb 6.4 oz (98.2 kg)   BMI 28.55 kg/m    Wt Readings from Last 3 Encounters:  05/23/19 216 lb 6.4 oz (98.2 kg)  12/28/18 226 lb (102.5 kg)  11/23/18 215 lb 6.4 oz (97.7 kg)    Physical Exam Vitals signs and nursing note reviewed.  Constitutional:      General: He is not in acute distress.    Appearance: Normal appearance. He is well-developed and well-groomed. He is not ill-appearing, toxic-appearing or diaphoretic.  HENT:     Head: Normocephalic and atraumatic.     Jaw: There is normal jaw occlusion.     Right Ear: Hearing normal.     Left Ear: Hearing normal.     Nose: Nose normal.     Mouth/Throat:     Lips: Pink.     Mouth: Mucous  membranes are moist.     Pharynx: Oropharynx is clear. Uvula midline.  Eyes:     General: Lids are normal.     Extraocular Movements: Extraocular movements intact.     Conjunctiva/sclera: Conjunctivae normal.     Pupils: Pupils are equal, round, and reactive to light.  Neck:     Musculoskeletal: Normal range of motion and neck supple.     Thyroid: No thyroid mass, thyromegaly  or thyroid tenderness.     Vascular: No carotid bruit or JVD.     Trachea: Trachea and phonation normal.   Cardiovascular:     Rate and Rhythm: Normal rate and regular rhythm.     Chest Wall: PMI is not displaced.     Pulses: Normal pulses.     Heart sounds: Normal heart sounds. No murmur. No friction rub. No gallop.   Pulmonary:     Effort: Pulmonary effort is normal. No respiratory distress.     Breath sounds: Normal breath sounds. No wheezing.  Abdominal:     General: Bowel sounds are normal. There is no distension or abdominal bruit.     Palpations: Abdomen is soft. There is no hepatomegaly or splenomegaly.     Tenderness: There is no abdominal tenderness. There is no right CVA tenderness or left CVA tenderness.     Hernia: No hernia is present.  Musculoskeletal: Normal range of motion.     Right lower leg: No edema.     Left lower leg: No edema.  Lymphadenopathy:     Cervical: No cervical adenopathy.     Right cervical: No superficial, deep or posterior cervical adenopathy.    Left cervical: No superficial, deep or posterior cervical adenopathy.     Upper Body:     Right upper body: No supraclavicular, axillary or pectoral adenopathy.     Left upper body: No supraclavicular, axillary or pectoral adenopathy.  Skin:    General: Skin is warm and dry.     Capillary Refill: Capillary refill takes less than 2 seconds.     Coloration: Skin is not cyanotic, jaundiced or pale.     Findings: No rash.  Neurological:     General: No focal deficit present.     Mental Status: He is alert and oriented to person,  place, and time.     Cranial Nerves: Cranial nerves are intact.     Sensory: Sensation is intact.     Motor: Motor function is intact.     Coordination: Coordination is intact.     Gait: Gait is intact.     Deep Tendon Reflexes: Reflexes are normal and symmetric.  Psychiatric:        Attention and Perception: Attention and perception normal.        Mood and Affect: Mood and affect normal.        Speech: Speech normal.        Behavior: Behavior normal. Behavior is cooperative.        Thought Content: Thought content normal.        Cognition and Memory: Cognition and memory normal.        Judgment: Judgment normal.     Results for orders placed or performed in visit on 12/28/18  Microscopic Examination  Result Value Ref Range   WBC, UA None seen 0 - 5 /hpf   RBC, UA None seen 0 - 2 /hpf   Epithelial Cells (non renal) None seen 0 - 10 /hpf   Renal Epithel, UA None seen None seen /hpf   Bacteria, UA None seen None seen/Few  CBC with Differential/Platelet  Result Value Ref Range   WBC 3.9 3.4 - 10.8 x10E3/uL   RBC 4.43 4.14 - 5.80 x10E6/uL   Hemoglobin 13.9 13.0 - 17.7 g/dL   Hematocrit 40.2 37.5 - 51.0 %   MCV 91 79 - 97 fL   MCH 31.4 26.6 - 33.0 pg   MCHC 34.6 31.5 - 35.7 g/dL  RDW 11.8 11.6 - 15.4 %   Platelets 193 150 - 450 x10E3/uL   Neutrophils 59 Not Estab. %   Lymphs 27 Not Estab. %   Monocytes 10 Not Estab. %   Eos 3 Not Estab. %   Basos 1 Not Estab. %   Neutrophils Absolute 2.3 1.4 - 7.0 x10E3/uL   Lymphocytes Absolute 1.0 0.7 - 3.1 x10E3/uL   Monocytes Absolute 0.4 0.1 - 0.9 x10E3/uL   EOS (ABSOLUTE) 0.1 0.0 - 0.4 x10E3/uL   Basophils Absolute 0.0 0.0 - 0.2 x10E3/uL   Immature Granulocytes 0 Not Estab. %   Immature Grans (Abs) 0.0 0.0 - 0.1 x10E3/uL  BMP8+EGFR  Result Value Ref Range   Glucose 96 65 - 99 mg/dL   BUN 17 8 - 27 mg/dL   Creatinine, Ser 1.08 0.76 - 1.27 mg/dL   GFR calc non Af Amer 72 >59 mL/min/1.73   GFR calc Af Amer 83 >59 mL/min/1.73    BUN/Creatinine Ratio 16 10 - 24   Sodium 142 134 - 144 mmol/L   Potassium 4.3 3.5 - 5.2 mmol/L   Chloride 102 96 - 106 mmol/L   CO2 25 20 - 29 mmol/L   Calcium 9.2 8.6 - 10.2 mg/dL  Lipid panel  Result Value Ref Range   Cholesterol, Total 164 100 - 199 mg/dL   Triglycerides 44 0 - 149 mg/dL   HDL 67 >39 mg/dL   VLDL Cholesterol Cal 9 5 - 40 mg/dL   LDL Calculated 88 0 - 99 mg/dL   Chol/HDL Ratio 2.4 0.0 - 5.0 ratio  VITAMIN D 25 Hydroxy (Vit-D Deficiency, Fractures)  Result Value Ref Range   Vit D, 25-Hydroxy 53.3 30.0 - 100.0 ng/mL  Hepatic function panel  Result Value Ref Range   Total Protein 6.6 6.0 - 8.5 g/dL   Albumin 4.6 3.6 - 4.8 g/dL   Bilirubin Total 0.5 0.0 - 1.2 mg/dL   Bilirubin, Direct 0.19 0.00 - 0.40 mg/dL   Alkaline Phosphatase 86 39 - 117 IU/L   AST 29 0 - 40 IU/L   ALT 25 0 - 44 IU/L  Thyroid Panel With TSH  Result Value Ref Range   TSH 0.975 0.450 - 4.500 uIU/mL   T4, Total 7.7 4.5 - 12.0 ug/dL   T3 Uptake Ratio 26 24 - 39 %   Free Thyroxine Index 2.0 1.2 - 4.9  PSA, total and free  Result Value Ref Range   Prostate Specific Ag, Serum 2.2 0.0 - 4.0 ng/mL   PSA, Free 0.67 N/A ng/mL   PSA, Free Pct 30.5 %  Urinalysis, Complete  Result Value Ref Range   Specific Gravity, UA 1.025 1.005 - 1.030   pH, UA 5.5 5.0 - 7.5   Color, UA Yellow Yellow   Appearance Ur Clear Clear   Leukocytes, UA Negative Negative   Protein, UA Negative Negative/Trace   Glucose, UA Negative Negative   Ketones, UA Negative Negative   RBC, UA Negative Negative   Bilirubin, UA Negative Negative   Urobilinogen, Ur 0.2 0.2 - 1.0 mg/dL   Nitrite, UA Negative Negative   Microscopic Examination See below:      X-Ray: Chest xray: No acute findings. Preliminary x-ray reading by Monia Pouch, FNP-C, [facility].   Pertinent labs & imaging results that were available during my care of the patient were reviewed by me and considered in my medical decision making.  Assessment & Plan:   Rockney was seen today for knot in neck.  Diagnoses  and all orders for this visit:  Mass in neck Approximate 5 mm firm mass to right anterior neck. No erythema or tenderness. Differentials include, but are not limited to, lymphadenopathy, thyroid nodule, lymphoma, lipoma, or cyst. Chest xray unremarkable in office. Labs and [place] pending.  -     DG Chest 2 View; Future -     CBC with Differential/Platelet -     US Soft Tissue Head/Neck; Future -     TSH -     Intact PTH (Includes Calcium)     Continue all other maintenance medications.  Follow up plan: Return in about 2 weeks (around 06/06/2019), or if symptoms worsen or fail to improve.   The above assessment and management plan was discussed with the patient. The patient verbalized understanding of and has agreed to the management plan. Patient is aware to call the clinic if symptoms persist or worsen. Patient is aware when to return to the clinic for a follow-up visit. Patient educated on when it is appropriate to go to the emergency department.   Monia Pouch, FNP-C [facility] [phone]

## 2019-05-24 LAB — CBC WITH DIFFERENTIAL/PLATELET
Basophils Absolute: 0 10*3/uL (ref 0.0–0.2)
Basos: 1 %
EOS (ABSOLUTE): 0.1 10*3/uL (ref 0.0–0.4)
Eos: 2 %
Hematocrit: 43.6 % (ref 37.5–51.0)
Hemoglobin: 14.9 g/dL (ref 13.0–17.7)
Immature Grans (Abs): 0 10*3/uL (ref 0.0–0.1)
Immature Granulocytes: 0 %
Lymphocytes Absolute: 1 10*3/uL (ref 0.7–3.1)
Lymphs: 22 %
MCH: 31 pg (ref 26.6–33.0)
MCHC: 34.2 g/dL (ref 31.5–35.7)
MCV: 91 fL (ref 79–97)
Monocytes Absolute: 0.3 10*3/uL (ref 0.1–0.9)
Monocytes: 7 %
Neutrophils Absolute: 3.2 10*3/uL (ref 1.4–7.0)
Neutrophils: 68 %
Platelets: 188 10*3/uL (ref 150–450)
RBC: 4.8 x10E6/uL (ref 4.14–5.80)
RDW: 12 % (ref 11.6–15.4)
WBC: 4.6 10*3/uL (ref 3.4–10.8)

## 2019-05-24 LAB — TSH: TSH: 0.412 u[IU]/mL — ABNORMAL LOW (ref 0.450–4.500)

## 2019-05-25 LAB — INTACT PTH (INCLUDES CALCIUM): PTH (Intact Assay): 19 pg/mL

## 2019-05-25 LAB — SPECIMEN STATUS REPORT

## 2019-05-25 LAB — CALCIUM: Calcium: 10 mg/dL (ref 8.6–10.2)

## 2019-05-26 ENCOUNTER — Ambulatory Visit (HOSPITAL_COMMUNITY): Payer: Medicare Other

## 2019-05-26 DIAGNOSIS — G5603 Carpal tunnel syndrome, bilateral upper limbs: Secondary | ICD-10-CM | POA: Diagnosis not present

## 2019-05-30 ENCOUNTER — Other Ambulatory Visit: Payer: Self-pay

## 2019-05-30 ENCOUNTER — Ambulatory Visit (HOSPITAL_COMMUNITY)
Admission: RE | Admit: 2019-05-30 | Discharge: 2019-05-30 | Disposition: A | Discharge status: Home / Self Care | Payer: Medicare Other | Source: Ambulatory Visit | Attending: Family Medicine | Admitting: Family Medicine

## 2019-05-30 DIAGNOSIS — R221 Localized swelling, mass and lump, neck: Secondary | ICD-10-CM | POA: Diagnosis not present

## 2019-06-09 ENCOUNTER — Other Ambulatory Visit: Payer: Self-pay | Admitting: Family Medicine

## 2019-06-09 DIAGNOSIS — M25642 Stiffness of left hand, not elsewhere classified: Secondary | ICD-10-CM | POA: Diagnosis not present

## 2019-07-04 ENCOUNTER — Ambulatory Visit: Payer: Medicare Other | Admitting: Family Medicine

## 2019-07-21 ENCOUNTER — Other Ambulatory Visit: Payer: Self-pay | Admitting: Family

## 2019-07-21 MED ORDER — AMOXICILLIN-POT CLAVULANATE 875-125 MG PO TABS: 1.0000 | ORAL_TABLET | Freq: Two times a day (BID) | ORAL | 0 refills | Status: DC

## 2019-07-28 ENCOUNTER — Ambulatory Visit (INDEPENDENT_AMBULATORY_CARE_PROVIDER_SITE_OTHER): Payer: Medicare Other | Admitting: Family

## 2019-07-28 ENCOUNTER — Telehealth: Payer: Medicare Other | Admitting: Physician Assistant

## 2019-07-28 ENCOUNTER — Encounter: Payer: Self-pay | Admitting: Family

## 2019-07-28 DIAGNOSIS — L03116 Cellulitis of left lower limb: Secondary | ICD-10-CM | POA: Diagnosis not present

## 2019-07-28 DIAGNOSIS — L0390 Cellulitis, unspecified: Secondary | ICD-10-CM

## 2019-07-28 MED ORDER — DOXYCYCLINE HYCLATE 100 MG PO TABS: 100.0000 mg | ORAL_TABLET | Freq: Two times a day (BID) | ORAL | 0 refills | Status: DC

## 2019-07-28 MED ORDER — MUPIROCIN 2 % EX OINT: 1.0000 "application " | TOPICAL_OINTMENT | Freq: Two times a day (BID) | CUTANEOUS | 0 refills | Status: DC

## 2019-07-28 NOTE — Progress Notes (Signed)
   Virtual Visit via telephone Note Due to COVID-19 pandemic this visit was conducted virtually. This visit type was conducted due to national recommendations for restrictions regarding the COVID-19 Pandemic (e.g. social distancing, sheltering in place) in an effort to limit this patient's exposure and mitigate transmission in our community. All issues noted in this document were discussed and addressed.  A physical exam was not performed with this format.  I connected with Marcus Jensen on 07/28/19 at 1:53 pm by telephone and verified that I am speaking with the correct person using two identifiers. Marcus Jensen is currently located at working and no one is currently with her during visit. The provider, Evelina Dun, FNP is located in their office at time of visit.  I discussed the limitations, risks, security and privacy concerns of performing an evaluation and management service by telephone and the availability of in person appointments. I also discussed with the patient that there may be a patient responsible charge related to this service. The patient expressed understanding and agreed to proceed.   History and Present Illness:  HPI Pt calls the office today with cellulitis of left lateral leg that occurred after he fell on 07/15/19. He was started on Augmentin on 07/21/19. He completed an Evisit this morning and attached a picture. He states the area has slightly improved over the last 3 days.  He reports mild tenderness of 2 out 10 . States he has mild swelling at the end of the day. No warmth. Minimal drainage.    ROS   Observations/Objective: No SOB or distress noted, abrasion with yellow slough, erythemas border with mild swelling  Assessment and Plan: 1. Cellulitis of left lower extremity Rest Elevated when possible Start doxycycline and bactroban  Call office if area worsens or does not improve  - doxycycline (VIBRA-TABS) 100 MG tablet; Take 1 tablet (100 mg total) by  mouth 2 (two) times daily.  Dispense: 20 tablet; Refill: 0 - mupirocin ointment (BACTROBAN) 2 %; Apply 1 application topically 2 (two) times daily.  Dispense: 22 g; Refill: 0      I discussed the assessment and treatment plan with the patient. The patient was provided an opportunity to ask questions and all were answered. The patient agreed with the plan and demonstrated an understanding of the instructions.   The patient was advised to call back or seek an in-person evaluation if the symptoms worsen or if the condition fails to improve as anticipated.  The above assessment and management plan was discussed with the patient. The patient verbalized understanding of and has agreed to the management plan. Patient is aware to call the clinic if symptoms persist or worsen. Patient is aware when to return to the clinic for a follow-up visit. Patient educated on when it is appropriate to go to the emergency department.   Time call ended:  2:03 pm  I provided 10 minutes of non-face-to-face time during this encounter.    Evelina Dun, FNP

## 2019-07-28 NOTE — Progress Notes (Signed)
Based on what you shared with me, I feel your condition warrants further evaluation and I recommend that you be seen for a face to face office visit.  Since you did not improve fully with an initial round of antibiotics, you would likely benefit from a wound culture at this time. This will help determine which antibiotic you need for further treatment.   NOTE: If you entered your credit card information for this eVisit, you will not be charged. You may see a "hold" on your card for the $35 but that hold will drop off and you will not have a charge processed.  If you are having a true medical emergency please call 911.    The following sites will take your insurance:  . [facility]    [phone]                  Get Driving Directions  [address], [place] [address] . 10 am to 8 pm Monday-Friday . 12 pm to 8 pm Saturday-Sunday   . [facility] [place]                  Get Driving Directions  [address], [place], [place] [address] . 8 am to 8 pm Monday-Friday . 9 am to 6 pm Saturday . 11 am to 6 pm Sunday   . [facility] [place]                  Get Driving Directions   [address] [place], [place] [address] . 8 am to 8 pm Monday-Friday . 8 am to 4 pm Saturday-Sunday    . [facility] at [place]                    Get Driving Directions  [phone]  [address], [place], [place] [address]  . Monday-Friday, 12 PM to 6 PM    Your e-visit answers were reviewed by a board certified advanced clinical practitioner to complete your personal care plan.  Thank you for using e-Visits.  I have spent 5 minutes in review of e-visit questionnaire, review and updating patient chart, medical decision making and response to patient.    Tenna Delaine, PA-C

## 2019-09-03 ENCOUNTER — Other Ambulatory Visit: Payer: Self-pay | Admitting: Family Medicine

## 2019-09-20 ENCOUNTER — Other Ambulatory Visit: Payer: Self-pay

## 2019-09-21 ENCOUNTER — Ambulatory Visit (INDEPENDENT_AMBULATORY_CARE_PROVIDER_SITE_OTHER): Payer: Medicare Other | Admitting: *Deleted

## 2019-09-21 DIAGNOSIS — Z23 Encounter for immunization: Secondary | ICD-10-CM | POA: Diagnosis not present

## 2019-11-23 ENCOUNTER — Other Ambulatory Visit: Payer: Self-pay | Admitting: Family

## 2019-11-24 NOTE — Telephone Encounter (Signed)
lmtcb to schedule follow up appt for further refills. 

## 2019-11-24 NOTE — Telephone Encounter (Signed)
[place]. NTBS 30 days given 10/03/19

## 2019-12-09 ENCOUNTER — Other Ambulatory Visit: Payer: Self-pay | Admitting: Family

## 2020-01-09 ENCOUNTER — Other Ambulatory Visit: Payer: Self-pay | Admitting: *Deleted

## 2020-01-09 MED ORDER — GABAPENTIN 400 MG PO CAPS: 400.0000 mg | ORAL_CAPSULE | Freq: Three times a day (TID) | ORAL | 0 refills | Status: DC

## 2020-01-10 ENCOUNTER — Ambulatory Visit (AMBULATORY_SURGERY_CENTER): Payer: Self-pay

## 2020-01-10 ENCOUNTER — Other Ambulatory Visit: Payer: Self-pay

## 2020-01-10 VITALS — Temp 97.3°F | Ht 71.0 in | Wt 226.8 lb

## 2020-01-10 DIAGNOSIS — Z01818 Encounter for other preprocedural examination: Secondary | ICD-10-CM

## 2020-01-10 DIAGNOSIS — Z1211 Encounter for screening for malignant neoplasm of colon: Secondary | ICD-10-CM

## 2020-01-10 MED ORDER — NA SULFATE-K SULFATE-MG SULF 17.5-3.13-1.6 GM/177ML PO SOLN: 1.0000 | Freq: Once | ORAL | 0 refills | Status: AC

## 2020-01-10 NOTE — Progress Notes (Signed)

## 2020-01-12 ENCOUNTER — Encounter: Payer: Self-pay | Admitting: Gastroenterology

## 2020-01-18 ENCOUNTER — Other Ambulatory Visit (HOSPITAL_COMMUNITY)
Admission: RE | Admit: 2020-01-18 | Discharge: 2020-01-18 | Disposition: A | Discharge status: Home / Self Care | Payer: Medicare Other | Source: Ambulatory Visit | Attending: Gastroenterology | Admitting: Gastroenterology

## 2020-01-18 ENCOUNTER — Other Ambulatory Visit: Payer: Self-pay

## 2020-01-18 DIAGNOSIS — Z01812 Encounter for preprocedural laboratory examination: Secondary | ICD-10-CM | POA: Diagnosis not present

## 2020-01-18 DIAGNOSIS — Z20822 Contact with and (suspected) exposure to covid-19: Secondary | ICD-10-CM | POA: Insufficient documentation

## 2020-01-18 LAB — SARS CORONAVIRUS 2 (TAT 6-24 HRS): SARS Coronavirus 2: NEGATIVE

## 2020-01-22 ENCOUNTER — Other Ambulatory Visit: Payer: Self-pay

## 2020-01-22 ENCOUNTER — Encounter: Payer: Self-pay | Admitting: Gastroenterology

## 2020-01-22 ENCOUNTER — Ambulatory Visit (AMBULATORY_SURGERY_CENTER): Payer: Medicare Other | Admitting: Gastroenterology

## 2020-01-22 VITALS — BP 135/75 | HR 58 | Temp 96.2°F | Resp 9 | Ht 73.0 in | Wt 226.8 lb

## 2020-01-22 DIAGNOSIS — Z86010 Personal history of colonic polyps: Secondary | ICD-10-CM | POA: Diagnosis not present

## 2020-01-22 DIAGNOSIS — Z1211 Encounter for screening for malignant neoplasm of colon: Secondary | ICD-10-CM

## 2020-01-22 DIAGNOSIS — D123 Benign neoplasm of transverse colon: Secondary | ICD-10-CM

## 2020-01-22 MED ORDER — SODIUM CHLORIDE 0.9 % IV SOLN: 500.0000 mL | Freq: Once | INTRAVENOUS | Status: DC

## 2020-01-22 NOTE — Progress Notes (Signed)
Called to room to assist during endoscopic procedure.  Patient ID and intended procedure confirmed with present staff. Received instructions for my participation in the procedure from the performing physician.  

## 2020-01-22 NOTE — Patient Instructions (Signed)
HANDOUTS PROVIDED ON: POLYPS  The polyp removed taken today have been sent for pathology.  The results can take 1-3 weeks to receive.  When your next colonoscopy should occur will be based on the pathology results.    You may resume your previous diet and medication schedule.  Thank you for allowing [place] to care for you today!!!  YOU HAD AN ENDOSCOPIC PROCEDURE TODAY AT [facility]:   Refer to the procedure report that was given to you for any specific questions about what was found during the examination.  If the procedure report does not answer your questions, please call your gastroenterologist to clarify.  If you requested that your care partner not be given the details of your procedure findings, then the procedure report has been included in a sealed envelope for you to review at your convenience later.  YOU SHOULD EXPECT: Some feelings of bloating in the abdomen. Passage of more gas than usual.  Walking can help get rid of the air that was put into your GI tract during the procedure and reduce the bloating. If you had a lower endoscopy (such as a colonoscopy or flexible sigmoidoscopy) you may notice spotting of blood in your stool or on the toilet paper. If you underwent a bowel prep for your procedure, you may not have a normal bowel movement for a few days.  Please Note:  You might notice some irritation and congestion in your nose or some drainage.  This is from the oxygen used during your procedure.  There is no need for concern and it should clear up in a day or so.  SYMPTOMS TO REPORT IMMEDIATELY:   Following lower endoscopy (colonoscopy or flexible sigmoidoscopy):  Excessive amounts of blood in the stool  Significant tenderness or worsening of abdominal pains  Swelling of the abdomen that is new, acute  Fever of 100F or higher  For urgent or emergent issues, a gastroenterologist can be reached at any hour by calling [phone].   DIET:  We do recommend a  small meal at first, but then you may proceed to your regular diet.  Drink plenty of fluids but you should avoid alcoholic beverages for 24 hours.  ACTIVITY:  You should plan to take it easy for the rest of today and you should NOT DRIVE or use heavy machinery until tomorrow (because of the sedation medicines used during the test).    FOLLOW UP: Our staff will call the number listed on your records 48-72 hours following your procedure to check on you and address any questions or concerns that you may have regarding the information given to you following your procedure. If we do not reach you, we will leave a message.  We will attempt to reach you two times.  During this call, we will ask if you have developed any symptoms of COVID 19. If you develop any symptoms (ie: fever, flu-like symptoms, shortness of breath, cough etc.) before then, please call [phone].  If you test positive for Covid 19 in the 2 weeks post procedure, please call and report this information to [place].    If any biopsies were taken you will be contacted by phone or by letter within the next 1-3 weeks.  Please call us at [phone] if you have not heard about the biopsies in 3 weeks.    SIGNATURES/CONFIDENTIALITY: You and/or your care partner have signed paperwork which will be entered into your electronic medical record.  These signatures attest to  the fact that that the information above on your After Visit Summary has been reviewed and is understood.  Full responsibility of the confidentiality of this discharge information lies with you and/or your care-partner.

## 2020-01-22 NOTE — Progress Notes (Signed)
Report given to PACU, vss 

## 2020-01-22 NOTE — Op Note (Signed)
[facility] Patient Name: Marcus Jensen Procedure Date: 01/22/2020 1:51 PM MRN: [id number] Endoscopist: Milus Banister , MD Age: 69 Referring MD:  Date of Birth: 01/07/1951 Gender: Male Account #: [id number] Procedure:                Colonoscopy Indications:              Screening for colorectal malignant neoplasm Medicines:                Monitored Anesthesia Care Procedure:                Pre-Anesthesia Assessment:                           - Prior to the procedure, a History and Physical                            was performed, and patient medications and                            allergies were reviewed. The patient's tolerance of                            previous anesthesia was also reviewed. The risks                            and benefits of the procedure and the sedation                            options and risks were discussed with the patient.                            All questions were answered, and informed consent                            was obtained. Prior Anticoagulants: The patient has                            taken no previous anticoagulant or antiplatelet                            agents. ASA Grade Assessment: II - A patient with                            mild systemic disease. After reviewing the risks                            and benefits, the patient was deemed in                            satisfactory condition to undergo the procedure.                           After obtaining informed consent, the colonoscope  was passed under direct vision. Throughout the                            procedure, the patient's blood pressure, pulse, and                            oxygen saturations were monitored continuously. The                            Colonoscope was introduced through the anus and                            advanced to the the cecum, identified by                            appendiceal orifice and  ileocecal valve. The                            colonoscopy was performed without difficulty. The                            patient tolerated the procedure well. The quality                            of the bowel preparation was good. The ileocecal                            valve, appendiceal orifice, and rectum were                            photographed. Scope In: 1:54:59 PM Scope Out: 2:15:45 PM Scope Withdrawal Time: 0 hours 18 minutes 33 seconds  Total Procedure Duration: 0 hours 20 minutes 46 seconds  Findings:                 A 3 mm polyp was found in the transverse colon. The                            polyp was sessile. The polyp was removed with a                            cold snare. Resection and retrieval were complete.                           Medium sized internal hemorrhoids.                           The exam was otherwise without abnormality on                            direct and retroflexion views. Complications:            No immediate complications. Estimated blood loss:  None. Estimated Blood Loss:     Estimated blood loss: none. Impression:               - One 3 mm polyp in the transverse colon, removed                            with a cold snare. Resected and retrieved.                           - Internal hemorrhoids.                           - The examination was otherwise normal on direct                            and retroflexion views. Recommendation:           - Patient has a contact number available for                            emergencies. The signs and symptoms of potential                            delayed complications were discussed with the                            patient. Return to normal activities tomorrow.                            Written discharge instructions were provided to the                            patient.                           - Resume previous diet.                           -  Continue present medications.                           - Await pathology results. Milus Banister, MD 01/22/2020 2:17:44 PM This report has been signed electronically.

## 2020-01-22 NOTE — Progress Notes (Signed)
Pt's states no medical or surgical changes since previsit or office visit.  TEMP-JB V/S-DT

## 2020-01-24 ENCOUNTER — Telehealth: Payer: Self-pay

## 2020-01-24 DIAGNOSIS — H2513 Age-related nuclear cataract, bilateral: Secondary | ICD-10-CM | POA: Diagnosis not present

## 2020-01-24 DIAGNOSIS — H40003 Preglaucoma, unspecified, bilateral: Secondary | ICD-10-CM | POA: Diagnosis not present

## 2020-01-24 DIAGNOSIS — H5231 Anisometropia: Secondary | ICD-10-CM | POA: Diagnosis not present

## 2020-01-24 NOTE — Telephone Encounter (Signed)
  Follow up Call-  Call back number 01/22/2020  Post procedure Call Back phone  # [phone]  Permission to leave phone message Yes  Some recent data might be hidden     Patient questions:  Do you have a fever, pain , or abdominal swelling? No. Pain Score  0 *  Have you tolerated food without any problems? Yes.    Have you been able to return to your normal activities? Yes.    Do you have any questions about your discharge instructions: Diet   No. Medications  No. Follow up visit  No.  Do you have questions or concerns about your Care? No.  Actions: * If pain score is 4 or above: No action needed, pain <4. 1. Have you developed a fever since your procedure? no  2.   Have you had an respiratory symptoms (SOB or cough) since your procedure? no  3.   Have you tested positive for COVID 19 since your procedure no  4.   Have you had any family members/close contacts diagnosed with the COVID 19 since your procedure?  no   If yes to any of these questions please route to Joylene John, RN and Alphonsa Gin, Therapist, sports.

## 2020-01-26 ENCOUNTER — Ambulatory Visit (INDEPENDENT_AMBULATORY_CARE_PROVIDER_SITE_OTHER): Payer: Medicare Other | Admitting: Family Medicine

## 2020-01-26 ENCOUNTER — Encounter: Payer: Self-pay | Admitting: Family Medicine

## 2020-01-26 ENCOUNTER — Other Ambulatory Visit: Payer: Self-pay

## 2020-01-26 ENCOUNTER — Encounter: Payer: Self-pay | Admitting: Gastroenterology

## 2020-01-26 VITALS — BP 151/77 | HR 59 | Temp 99.1°F | Ht 73.0 in | Wt 223.6 lb

## 2020-01-26 DIAGNOSIS — M5412 Radiculopathy, cervical region: Secondary | ICD-10-CM

## 2020-01-26 DIAGNOSIS — R3914 Feeling of incomplete bladder emptying: Secondary | ICD-10-CM

## 2020-01-26 DIAGNOSIS — N401 Enlarged prostate with lower urinary tract symptoms: Secondary | ICD-10-CM | POA: Diagnosis not present

## 2020-01-26 DIAGNOSIS — I10 Essential (primary) hypertension: Secondary | ICD-10-CM

## 2020-01-26 DIAGNOSIS — E559 Vitamin D deficiency, unspecified: Secondary | ICD-10-CM | POA: Diagnosis not present

## 2020-01-26 DIAGNOSIS — E7800 Pure hypercholesterolemia, unspecified: Secondary | ICD-10-CM

## 2020-01-26 DIAGNOSIS — M4802 Spinal stenosis, cervical region: Secondary | ICD-10-CM

## 2020-01-26 DIAGNOSIS — J309 Allergic rhinitis, unspecified: Secondary | ICD-10-CM | POA: Insufficient documentation

## 2020-01-26 DIAGNOSIS — G609 Hereditary and idiopathic neuropathy, unspecified: Secondary | ICD-10-CM

## 2020-01-26 MED ORDER — GABAPENTIN 400 MG PO CAPS: 400.0000 mg | ORAL_CAPSULE | Freq: Three times a day (TID) | ORAL | 2 refills | Status: DC

## 2020-01-26 MED ORDER — AMLODIPINE BESYLATE 5 MG PO TABS: 5.0000 mg | ORAL_TABLET | Freq: Every day | ORAL | 3 refills | Status: DC

## 2020-01-26 NOTE — Progress Notes (Signed)
Subjective: CC: Elevated BPs, DDD Cspine, est care new doctor PCP: Janora Norlander, DO Marcus Jensen is a 69 y.o. male presenting to clinic today for:  1. Elevated blood pressure readings Patient notes gradual increase in blood pressure readings at home over the last year.  He was measuring systolic blood pressures in the 130s around the summertime but these have gradually gone up into the 140s and 150s.  He denies symptomatology including chest pain, shortness of breath, lightheadedness, lower extremity motor change in exercise tolerance.  He tries to remain physically active.  He is treated for cholesterol with Crestor and has no issues taking this.  2.  Degenerative disc disease of the cervical spine Patient with known degenerative disc disease of the C-spine.  He is seen by Dr. Sherwood Gambler in the past for this and they discussed surgery but he did not feel that he was quite ready for that.  They have also offered corticosteroid injection of the C-spine but again pain has not been severe enough to pursue this.  He does report radicular symptoms which affected the posterior aspect of the right upper extremity.  He notes a recent bilateral carpal tunnel surgery in June with Dr. Amedeo Plenty.  Additionally, he takes gabapentin for peripheral neuropathy of uncertain etiology.  No history of alcohol use, diabetes, autoimmune disease.  He has had frequent tick bites however but the neuropathy seem to precede Lyme rise in this area.  3. Nasopharyngeal fullness He reports a few month h/o left sided nasopharyngeal fullness.  He thinks this to be due to postnasal drip/ allergies.  Does not report change in voice, difficulty swallowing. Nonsmoker.  4. BPH  Patient reports weak urinary stream and occasional sensation of incomplete bladder emptying.  Not on BPH medication.  No especially bothersome.  ROS: Per HPI  No Known Allergies Past Medical History:  Diagnosis Date  . Allergy    environmental    . Arthritis    Cervical disc disease  . GERD (gastroesophageal reflux disease)   . Hyperlipidemia   . Peripheral neuropathy   . PONV (postoperative nausea and vomiting)     Current Outpatient Medications:  .  b complex vitamins capsule, Take 1 capsule by mouth daily., Disp: , Rfl:  .  cholecalciferol (VITAMIN D) 1000 UNITS tablet, Take 2,000 Units by mouth daily., Disp: , Rfl:  .  Cyanocobalamin (VITAMIN B 12 PO), Take 1 tablet by mouth daily., Disp: , Rfl:  .  gabapentin (NEURONTIN) 400 MG capsule, Take 1 capsule (400 mg total) by mouth 3 (three) times daily., Disp: 270 capsule, Rfl: 0 .  glucosamine-chondroitin 500-400 MG tablet, Take 2 tablets by mouth 2 (two) times daily. , Disp: , Rfl:  .  naproxen sodium (ALEVE) 220 MG tablet, Take 220 mg by mouth 2 (two) times daily as needed., Disp: , Rfl:  .  Omega-3 Fatty Acids (FISH OIL) 1000 MG CAPS, Take 2 capsules by mouth daily., Disp: , Rfl:  .  rosuvastatin (CRESTOR) 20 MG tablet, TAKE 1 TABLET BY MOUTH  DAILY, Disp: 90 tablet, Rfl: 3 .  famotidine (PEPCID) 20 MG tablet, Take 10 mg by mouth 2 (two) times daily., Disp: , Rfl:  Social History   Socioeconomic History  . Marital status: Married    Spouse name: Not on file  . Number of children: 2  . Years of education: Not on file  . Highest education level: Not on file  Occupational History  . Occupation: Vet  Tobacco Use  .  Smoking status: Former Smoker    Packs/day: 1.00    Years: 6.00    Pack years: 6.00    Types: Cigarettes    Quit date: 02/01/1990    Years since quitting: 30.0  . Smokeless tobacco: Former Systems developer    Types: Tatums date: 08/20/1987  Substance and Sexual Activity  . Alcohol use: Yes    Alcohol/week: 4.0 standard drinks    Types: 2 Cans of beer, 2 Glasses of wine per week  . Drug use: No  . Sexual activity: Yes    Birth control/protection: None  Other Topics Concern  . Not on file  Social History Narrative  . Not on file   Social Determinants of  Health   Financial Resource Strain:   . Difficulty of Paying Living Expenses: Not on file  Food Insecurity:   . Worried About Charity fundraiser in the Last Year: Not on file  . Ran Out of Food in the Last Year: Not on file  Transportation Needs:   . Lack of Transportation (Medical): Not on file  . Lack of Transportation (Non-Medical): Not on file  Physical Activity:   . Days of Exercise per Week: Not on file  . Minutes of Exercise per Session: Not on file  Stress:   . Feeling of Stress : Not on file  Social Connections:   . Frequency of Communication with Friends and Family: Not on file  . Frequency of Social Gatherings with Friends and Family: Not on file  . Attends Religious Services: Not on file  . Active Member of Clubs or Organizations: Not on file  . Attends Archivist Meetings: Not on file  . Marital Status: Not on file  Intimate Partner Violence:   . Fear of Current or Ex-Partner: Not on file  . Emotionally Abused: Not on file  . Physically Abused: Not on file  . Sexually Abused: Not on file   Family History  Problem Relation Age of Onset  . CVA Mother 74  . CVA Father   . Cancer Brother        liver  . Colon cancer Neg Hx   . Colon polyps Neg Hx   . Esophageal cancer Neg Hx   . Rectal cancer Neg Hx   . Stomach cancer Neg Hx     Objective: Office vital signs reviewed. BP (!) 151/77   Pulse (!) 59   Temp 99.1 F (37.3 C)   Ht '6\' 1"'$  (1.854 m)   Wt 223 lb 9.6 oz (101.4 kg)   SpO2 98%   BMI 29.50 kg/m   Physical Examination:  General: Awake, alert, well nourished, well appearing. No acute distress HEENT: Normal; no neck masses noted, no carotid bruits Cardio: regular rate and rhythm, S1S2 heard, no murmurs appreciated Pulm: clear to auscultation bilaterally, no wheezes, rhonchi or rales; normal work of breathing on room air Extremities: warm, well perfused, No edema, cyanosis or clubbing; +2 pulses bilaterally MSK: normal gait and  station Skin: dry; intact; no rashes or lesions  Assessment/ Plan: 69 y.o. male   1. Essential hypertension New diagnosis.  Start Norvasc 5 mg daily.  Monitor BPs.  Discussed goal BP 130/80. - amLODipine (NORVASC) 5 MG tablet; Take 1 tablet (5 mg total) by mouth daily.  Dispense: 90 tablet; Refill: 3 - CMP14+EGFR  2. Pure hypercholesterolemia Check fasting lipid.  Continue Crestor - Lipid Panel - CMP14+EGFR - TSH  3. Benign prostatic hyperplasia with incomplete bladder  emptying Does not wish medications at this time.  Check PSA. - PSA  4. Spinal stenosis of cervical region Stable.  Sees Dr Sherwood Gambler.  Continue Neurontin  5. Cervical radiculopathy  6. Allergic rhinitis, unspecified seasonality, unspecified trigger OTC Claritin, Nasacort.  If not improvement, referral to ENT for evaluation  7. Idiopathic peripheral neuropathy Stable. - gabapentin (NEURONTIN) 400 MG capsule; Take 1 capsule (400 mg total) by mouth 3 (three) times daily.  Dispense: 270 capsule; Refill: 2 - Vitamin B12 - CBC  8. Vitamin D deficiency - VITAMIN D 25 Hydroxy (Vit-D Deficiency, Fractures)   No orders of the defined types were placed in this encounter.  No orders of the defined types were placed in this encounter.    Janora Norlander, DO [place] [phone]

## 2020-01-26 NOTE — Patient Instructions (Addendum)
You had labs performed today.  You will be contacted with the results of the labs once they are available, usually in the next 3 business days for routine lab work.  If you have an active my chart account, they will be released to your MyChart.  If you prefer to have these labs released to you via telephone, please let us know.  If you had a pap smear or biopsy performed, expect to be contacted in about 7-10 days.   Would trial claritin and Nasacort for the nasopharyngeal symptoms you described.  If persistent or worsening, let me know and I'll get you to ENT for direct visualization of sinuses  How to Take Your Blood Pressure You can take your blood pressure at home with a machine. You may need to check your blood pressure at home:  To check if you have high blood pressure (hypertension).  To check your blood pressure over time.  To make sure your blood pressure medicine is working. Supplies needed: You will need a blood pressure machine, or monitor. You can buy one at a drugstore or online. When choosing one:  Choose one with an arm cuff.  Choose one that wraps around your upper arm. Only one finger should fit between your arm and the cuff.  Do not choose one that measures your blood pressure from your wrist or finger. Your doctor can suggest a monitor. How to prepare Avoid these things for 30 minutes before checking your blood pressure:  Drinking caffeine.  Drinking alcohol.  Eating.  Smoking.  Exercising. Five minutes before checking your blood pressure:  Pee.  Sit in a dining chair. Avoid sitting in a soft couch or armchair.  Be quiet. Do not talk. How to take your blood pressure Follow the instructions that came with your machine. If you have a digital blood pressure monitor, these may be the instructions: 1. Sit up straight. 2. Place your feet on the floor. Do not cross your ankles or legs. 3. Rest your left arm at the level of your heart. You may rest it on a  table, desk, or chair. 4. Pull up your shirt sleeve. 5. Wrap the blood pressure cuff around the upper part of your left arm. The cuff should be 1 inch (2.5 cm) above your elbow. It is best to wrap the cuff around bare skin. 6. Fit the cuff snugly around your arm. You should be able to place only one finger between the cuff and your arm. 7. Put the cord inside the groove of your elbow. 8. Press the power button. 9. Sit quietly while the cuff fills with air and loses air. 10. Write down the numbers on the screen. 11. Wait 2-3 minutes and then repeat steps 1-10. What do the numbers mean? Two numbers make up your blood pressure. The first number is called systolic pressure. The second is called diastolic pressure. An example of a blood pressure reading is "120 over 80" (or 120/80). If you are an adult and do not have a medical condition, use this guide to find out if your blood pressure is normal: Normal  First number: below 120.  Second number: below 80. Elevated  First number: 120-129.  Second number: below 80. Hypertension stage 1  First number: 130-139.  Second number: 80-89. Hypertension stage 2  First number: 140 or above.  Second number: 53 or above. Your blood pressure is above normal even if only the top or bottom number is above normal. Follow these instructions  at home:  Check your blood pressure as often as your doctor tells you to.  Take your monitor to your next doctor's appointment. Your doctor will: ? Make sure you are using it correctly. ? Make sure it is working right.  Make sure you understand what your blood pressure numbers should be.  Tell your doctor if your medicines are causing side effects. Contact a doctor if:  Your blood pressure keeps being high. Get help right away if:  Your first blood pressure number is higher than 180.  Your second blood pressure number is higher than 120. This information is not intended to replace advice given to you  by your health care provider. Make sure you discuss any questions you have with your health care provider. Document Revised: 11/19/2017 Document Reviewed: 05/15/2016 Elsevier Patient Education  2020 [employer].

## 2020-01-27 LAB — LIPID PANEL
Chol/HDL Ratio: 2.6 ratio (ref 0.0–5.0)
Cholesterol, Total: 177 mg/dL (ref 100–199)
HDL: 67 mg/dL (ref >39)
LDL Chol Calc (NIH): 101 mg/dL — ABNORMAL HIGH (ref 0–99)
Triglycerides: 46 mg/dL (ref 0–149)
VLDL Cholesterol Cal: 9 mg/dL (ref 5–40)

## 2020-01-27 LAB — CMP14+EGFR
ALT: 26 IU/L (ref 0–44)
AST: 33 IU/L (ref 0–40)
Albumin/Globulin Ratio: 1.9 (ref 1.2–2.2)
Albumin: 4.8 g/dL (ref 3.8–4.8)
Alkaline Phosphatase: 84 IU/L (ref 39–117)
BUN/Creatinine Ratio: 12 (ref 10–24)
BUN: 13 mg/dL (ref 8–27)
Bilirubin Total: 0.7 mg/dL (ref 0.0–1.2)
CO2: 24 mmol/L (ref 20–29)
Calcium: 9.6 mg/dL (ref 8.6–10.2)
Chloride: 101 mmol/L (ref 96–106)
Creatinine, Ser: 1.09 mg/dL (ref 0.76–1.27)
GFR calc Af Amer: 81 mL/min/{1.73_m2} (ref >59)
GFR calc non Af Amer: 70 mL/min/{1.73_m2} (ref >59)
Globulin, Total: 2.5 g/dL (ref 1.5–4.5)
Glucose: 89 mg/dL (ref 65–99)
Potassium: 4.5 mmol/L (ref 3.5–5.2)
Sodium: 141 mmol/L (ref 134–144)
Total Protein: 7.3 g/dL (ref 6.0–8.5)

## 2020-01-27 LAB — CBC
Hematocrit: 43.9 % (ref 37.5–51.0)
Hemoglobin: 15.4 g/dL (ref 13.0–17.7)
MCH: 31 pg (ref 26.6–33.0)
MCHC: 35.1 g/dL (ref 31.5–35.7)
MCV: 88 fL (ref 79–97)
Platelets: 181 10*3/uL (ref 150–450)
RBC: 4.97 x10E6/uL (ref 4.14–5.80)
RDW: 12 % (ref 11.6–15.4)
WBC: 3.5 10*3/uL (ref 3.4–10.8)

## 2020-01-27 LAB — VITAMIN D 25 HYDROXY (VIT D DEFICIENCY, FRACTURES): Vit D, 25-Hydroxy: 54.2 ng/mL (ref 30.0–100.0)

## 2020-01-27 LAB — PSA: Prostate Specific Ag, Serum: 2.8 ng/mL (ref 0.0–4.0)

## 2020-01-27 LAB — TSH: TSH: 0.524 u[IU]/mL (ref 0.450–4.500)

## 2020-01-27 LAB — VITAMIN B12: Vitamin B-12: >2000 pg/mL — ABNORMAL HIGH (ref 232–1245)

## 2020-04-10 IMAGING — DX DG CHEST 2V
2 series · 2 of 2 positions shown · non-contrast
Comparison: 02/25/2016

CLINICAL DATA: Cough

EXAM:
CHEST - 2 VIEW

[chest pa]
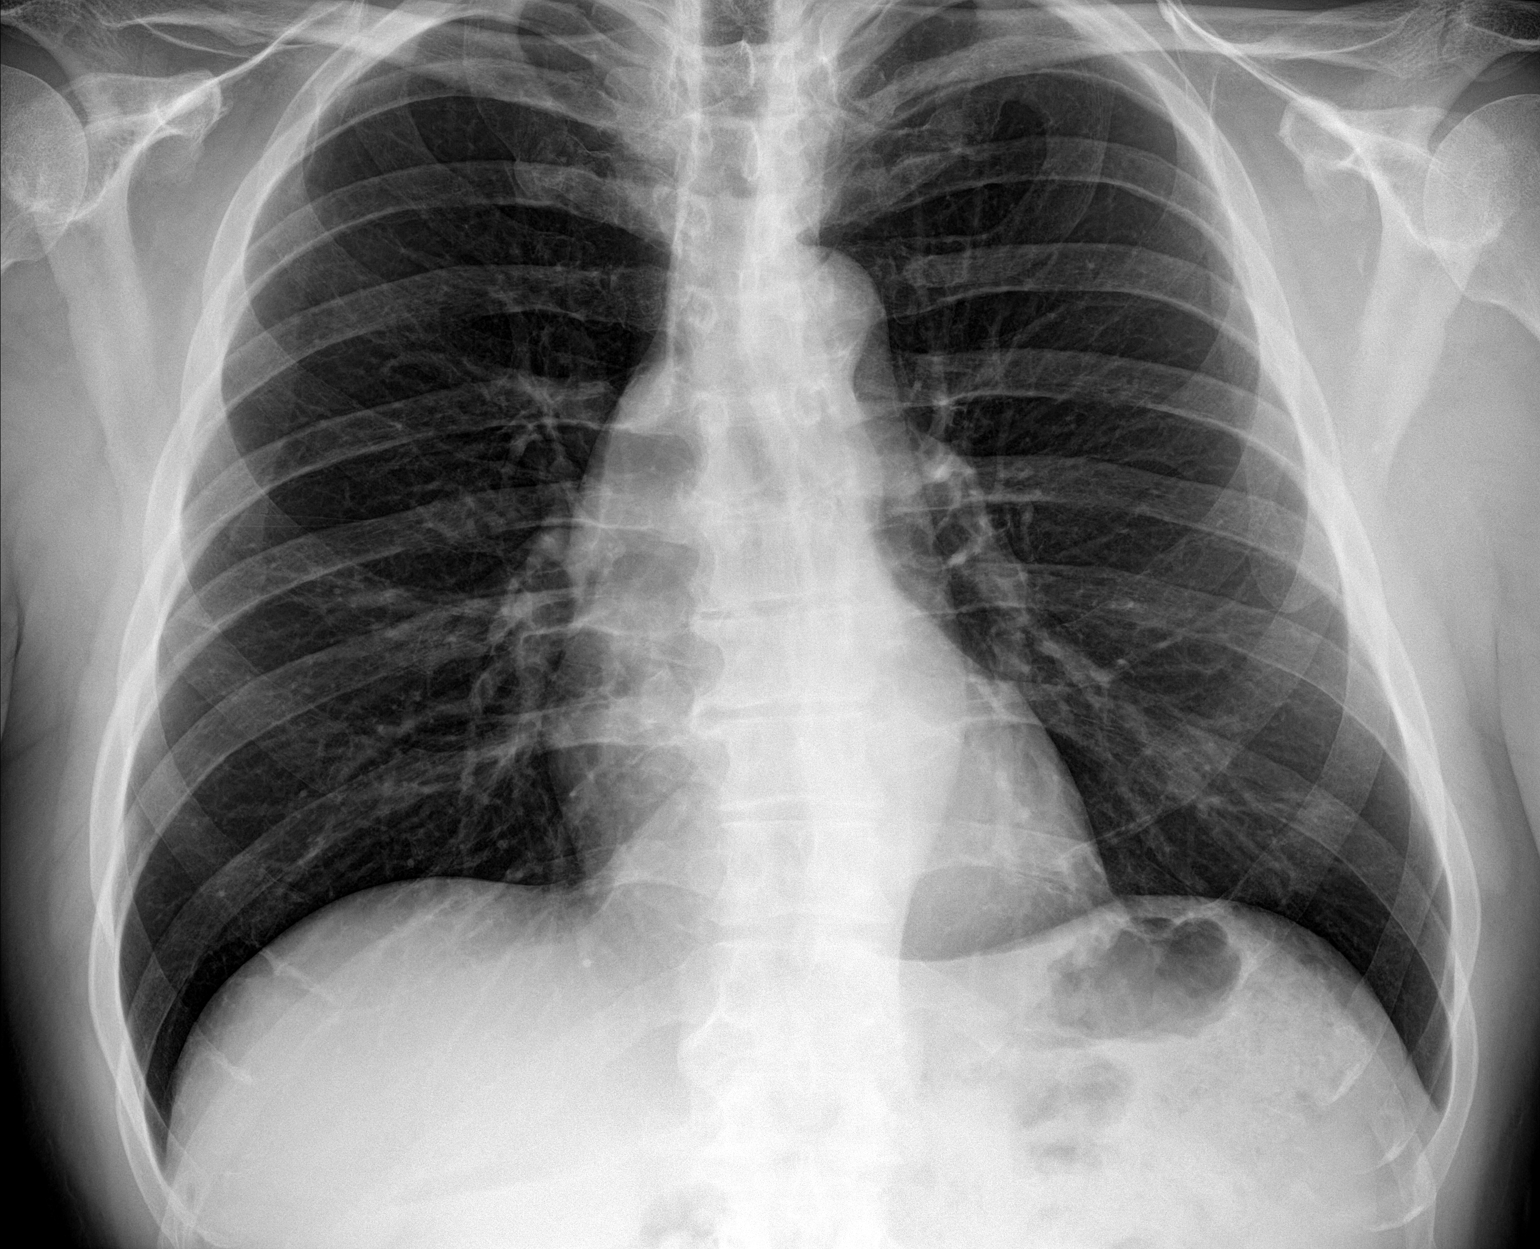

[chest lat]
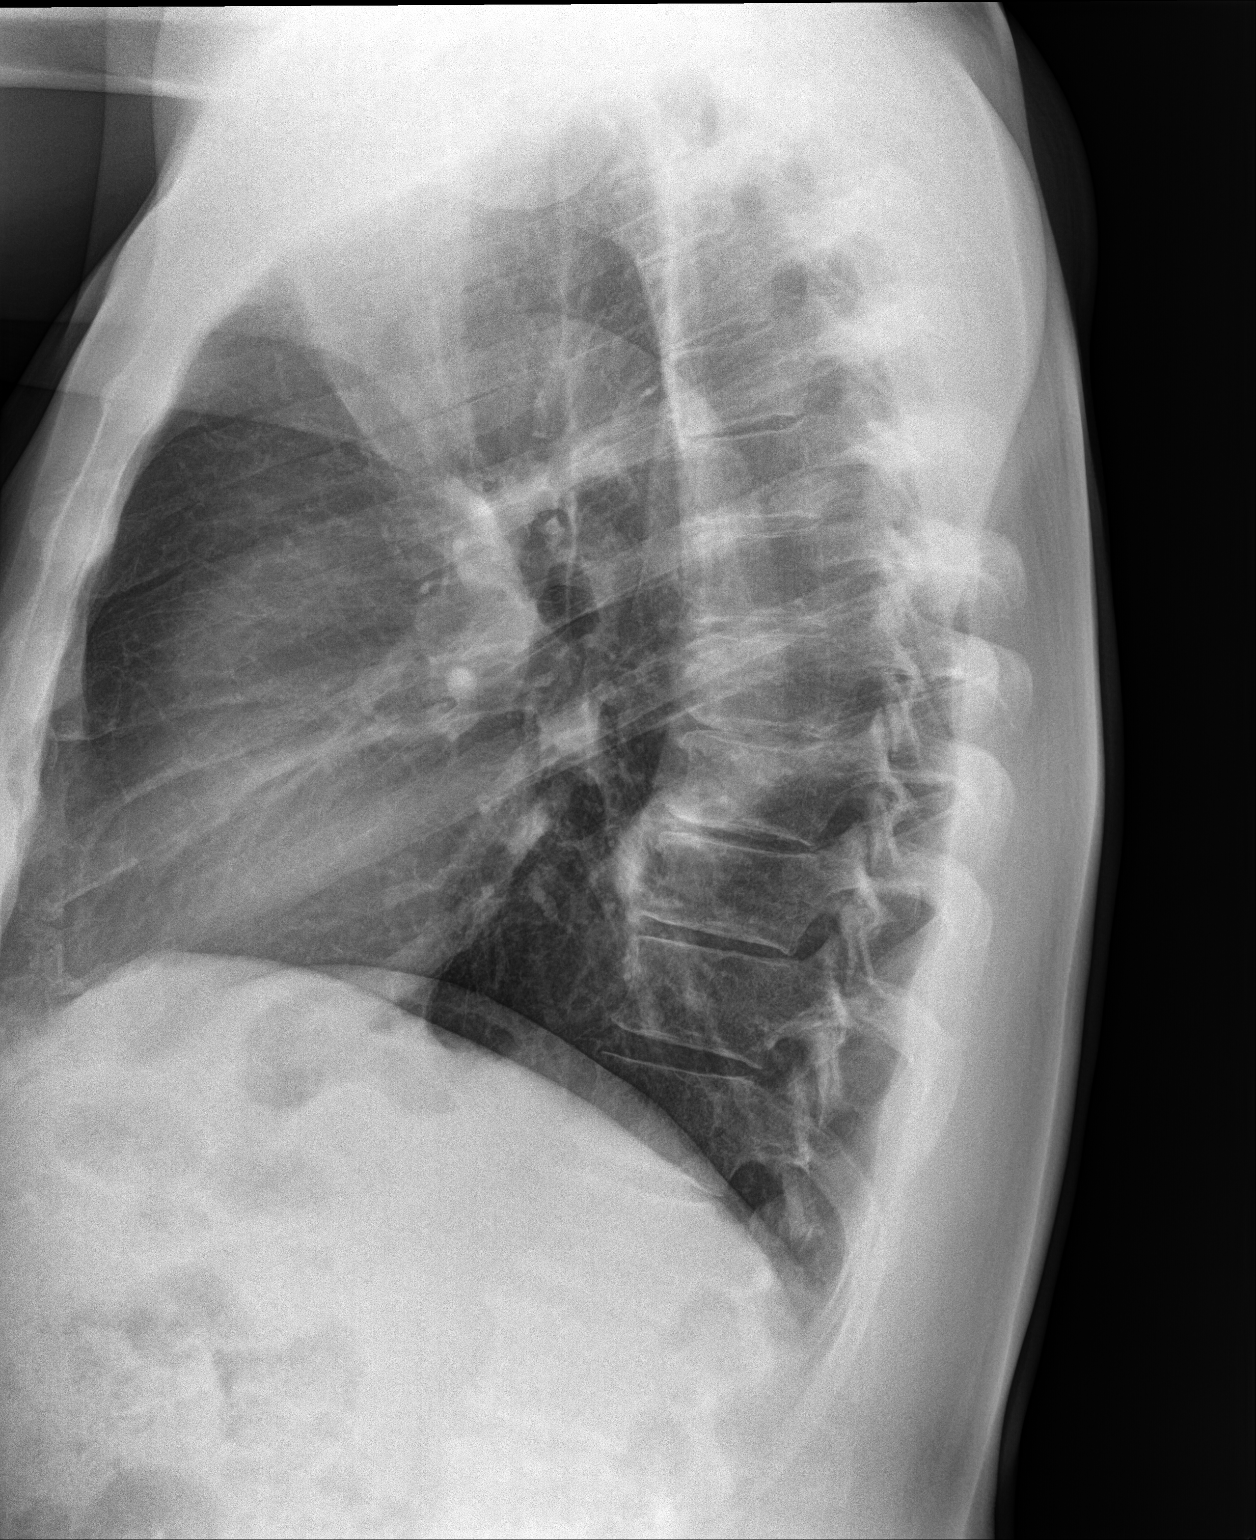

[2 of 2 positions shown; findings below may reference images not displayed]

FINDINGS: The heart size and mediastinal contours are within normal limits.
Both lungs are clear. The visualized skeletal structures are
unremarkable.
IMPRESSION: No active cardiopulmonary disease.

## 2020-06-12 ENCOUNTER — Ambulatory Visit (INDEPENDENT_AMBULATORY_CARE_PROVIDER_SITE_OTHER): Payer: Medicare Other

## 2020-06-12 ENCOUNTER — Other Ambulatory Visit: Payer: Self-pay

## 2020-06-12 ENCOUNTER — Ambulatory Visit (INDEPENDENT_AMBULATORY_CARE_PROVIDER_SITE_OTHER): Payer: Medicare Other | Admitting: Family Medicine

## 2020-06-12 ENCOUNTER — Encounter: Payer: Self-pay | Admitting: Family Medicine

## 2020-06-12 VITALS — BP 116/64 | HR 60 | Temp 97.9°F | Ht 73.0 in | Wt 225.6 lb

## 2020-06-12 DIAGNOSIS — I8311 Varicose veins of right lower extremity with inflammation: Secondary | ICD-10-CM

## 2020-06-12 DIAGNOSIS — G609 Hereditary and idiopathic neuropathy, unspecified: Secondary | ICD-10-CM

## 2020-06-12 DIAGNOSIS — E7800 Pure hypercholesterolemia, unspecified: Secondary | ICD-10-CM

## 2020-06-12 DIAGNOSIS — M48061 Spinal stenosis, lumbar region without neurogenic claudication: Secondary | ICD-10-CM

## 2020-06-12 DIAGNOSIS — M4802 Spinal stenosis, cervical region: Secondary | ICD-10-CM | POA: Diagnosis not present

## 2020-06-12 DIAGNOSIS — M5416 Radiculopathy, lumbar region: Secondary | ICD-10-CM

## 2020-06-12 DIAGNOSIS — M5136 Other intervertebral disc degeneration, lumbar region: Secondary | ICD-10-CM | POA: Diagnosis not present

## 2020-06-12 DIAGNOSIS — M5412 Radiculopathy, cervical region: Secondary | ICD-10-CM

## 2020-06-12 DIAGNOSIS — I8312 Varicose veins of left lower extremity with inflammation: Secondary | ICD-10-CM

## 2020-06-12 DIAGNOSIS — N529 Male erectile dysfunction, unspecified: Secondary | ICD-10-CM

## 2020-06-12 MED ORDER — SILDENAFIL CITRATE 100 MG PO TABS: 50.0000 mg | ORAL_TABLET | Freq: Every day | ORAL | 11 refills | Status: DC | PRN

## 2020-06-12 NOTE — Progress Notes (Signed)
Subjective: CC: Right shoulder pain, left lower extremity pain PCP: Janora Norlander, DO Marcus Jensen is a 69 y.o. male presenting to clinic today for:  1.  Right shoulder pain Patient reports chronic right-sided shoulder pain that seems to be worse lately.  He has known degenerative changes within the C-spine.  He is previously seen Dr. Sherwood Gambler with regards to this but areas of his symptomology were not consistent with imaging study and therefore surgical intervention was not recommended.  He has been maintaining with medication in traction and up until recently this had been working fairly well to control symptoms.  Last MRI of C-spine was 5 to 7 years ago.  He notes he has been having worsening paresthesias along the lateral aspect of the upper arm and into digits 1 through theory of the right hand.  He also has numbness along the dorsum of that hand.  He gets pain into the trapezius on the right.  He is chronically had upper extremity weakness on that side but it is stable.  He uses gabapentin and Aleve with moderate results of pain relief but he worries about the progression and need to intervene.  2.  Left lower extremity pain/varicose veins Patient reports left lower extremity discomfort.  He has known peripheral neuropathy in bilateral feet but in the left foot things seem to be progressing.  He points between digits 1 and 2 as one of the more severe areas.  He has chronic swelling of bilateral ankles which also is worse on the left.  There is history of a left knee arthroscopy previously.  He continued to have crepitus in that knee.  He does have fullness and tightness in that left knee intermittently.  Again, symptoms are relieved some by the medications as above.  He also notes varicose veins along bilateral thighs with again left being greater than right.  He would like to see a vascular specialist about this.  3.  Erectile dysfunction Patient reports difficulty maintaining an  erection.  He is able to achieve 1 without difficulty but they do not last as long as he would like.  He would like to start medications for this.  No heart history.  4.  Hyperlipidemia Patient was not compliant with Crestor consistently previously.  He has however been faithfully taking it now.  He would like to come in for fasting lipid panel tomorrow.  Does not endorse any chest pain, shortness of breath, abdominal pain.   ROS: Per HPI  No Known Allergies Past Medical History:  Diagnosis Date   Allergy    environmental   Arthritis    Cervical disc disease   GERD (gastroesophageal reflux disease)    Hyperlipidemia    Peripheral neuropathy    PONV (postoperative nausea and vomiting)     Current Outpatient Medications:    amLODipine (NORVASC) 5 MG tablet, Take 1 tablet (5 mg total) by mouth daily., Disp: 90 tablet, Rfl: 3   b complex vitamins capsule, Take 1 capsule by mouth daily., Disp: , Rfl:    cholecalciferol (VITAMIN D) 1000 UNITS tablet, Take 2,000 Units by mouth daily., Disp: , Rfl:    Cyanocobalamin (VITAMIN B 12 PO), Take 1 tablet by mouth daily., Disp: , Rfl:    famotidine (PEPCID) 20 MG tablet, Take 10 mg by mouth 2 (two) times daily., Disp: , Rfl:    gabapentin (NEURONTIN) 400 MG capsule, Take 1 capsule (400 mg total) by mouth 3 (three) times daily., Disp: 270 capsule,  Rfl: 2   glucosamine-chondroitin 500-400 MG tablet, Take 2 tablets by mouth 2 (two) times daily. , Disp: , Rfl:    naproxen sodium (ALEVE) 220 MG tablet, Take 220 mg by mouth 2 (two) times daily as needed., Disp: , Rfl:    Omega-3 Fatty Acids (FISH OIL) 1000 MG CAPS, Take 2 capsules by mouth daily., Disp: , Rfl:    rosuvastatin (CRESTOR) 20 MG tablet, TAKE 1 TABLET BY MOUTH  DAILY, Disp: 90 tablet, Rfl: 3 Social History   Socioeconomic History   Marital status: Married    Spouse name: Not on file   Number of children: 2   Years of education: Not on file   Highest education level:  Not on file  Occupational History   Occupation: Vet  Tobacco Use   Smoking status: Former Smoker    Packs/day: 1.00    Years: 6.00    Pack years: 6.00    Types: Cigarettes    Quit date: 02/01/1990    Years since quitting: 30.3   Smokeless tobacco: Former Systems developer    Types: [place] date: 08/20/1987  Vaping Use   Vaping Use: Never used  Substance and Sexual Activity   Alcohol use: Yes    Alcohol/week: 4.0 standard drinks    Types: 2 Cans of beer, 2 Glasses of wine per week   Drug use: No   Sexual activity: Yes    Birth control/protection: None  Other Topics Concern   Not on file  Social History Narrative   Not on file   Social Determinants of Health   Financial Resource Strain:    Difficulty of Paying Living Expenses:   Food Insecurity:    Worried About Charity fundraiser in the Last Year:    Arboriculturist in the Last Year:   Transportation Needs:    Film/video editor (Medical):    Lack of Transportation (Non-Medical):   Physical Activity:    Days of Exercise per Week:    Minutes of Exercise per Session:   Stress:    Feeling of Stress :   Social Connections:    Frequency of Communication with Friends and Family:    Frequency of Social Gatherings with Friends and Family:    Attends Religious Services:    Active Member of Clubs or Organizations:    Attends Music therapist:    Marital Status:   Intimate Partner Violence:    Fear of Current or Ex-Partner:    Emotionally Abused:    Physically Abused:    Sexually Abused:    Family History  Problem Relation Age of Onset   CVA Mother 44   CVA Father    Cancer Brother        liver   Colon cancer Neg Hx    Colon polyps Neg Hx    Esophageal cancer Neg Hx    Rectal cancer Neg Hx    Stomach cancer Neg Hx     Objective: Office vital signs reviewed. BP 116/64    Pulse 60    Temp 97.9 F (36.6 C)    Ht 6\' 1"  (1.854 m)    Wt 225 lb 9.6 oz (102.3 kg)    SpO2  98%    BMI 29.76 kg/m   Physical Examination:  General: Awake, alert, well nourished, No acute distress HEENT: Normal, sclera white, MMM Cardio: regular rate   Pulm: clear to auscultation bilaterally, no wheezes, rhonchi or rales; normal work of  breathing on room air Extremities: warm, well perfused, trace to 1+ pitting edema to ankles.  No cyanosis or clubbing; +2 pulses bilaterally MSK: minimally antalgic gait and normal station  Cervical spine: Patient has full active range of motion in all planes.  No midline tenderness palpation.  He does have increased muscle tonicity along the right trapezius into the upper back.  Lumbar spine: Active range of motion is preserved.  No midline tenderness palpation.  Minimal paraspinal muscle tenderness palpation along the lumbosacral joint and SI area. Skin: dry; intact; no rashes.  Moderate varicose veins noted along the thighs bilaterally with left being more affected than the right. Neuro: Upper extremity light touch sensation decreased along the thumb and the right hand and along the lateral aspect of the right upper extremity.  patellar DTRs 1/4 bilaterally  Assessment/ Plan: 69 y.o. male   1. Spinal stenosis of cervical region I reviewed his MRI of the C-spine from February 2013.  Given progression of symptoms I will repeat MRI and likely refer him to spinal surgery. - MR Cervical Spine Wo Contrast; Future  2. Cervical radiculopathy - MR Cervical Spine Wo Contrast; Future  3. Degenerative lumbar spinal stenosis Pressure review of lumbar x-ray demonstrated degenerative changes with osteophyte formation on multiple levels of the lumbar spine.  Given progression of neuropathy in the left foot, I think MRI is appropriate.  We will see if we can coordinate both the C-spine and lumbar spine and again refer him appropriately - MR Lumbar Spine Wo Contrast; Future  4. Lumbar radiculopathy - DG Lumbar Spine 2-3 Views; Future - MR Lumbar Spine Wo  Contrast; Future  5. Idiopathic peripheral neuropathy Continue gabapentin, naproxen  6. Erectile dysfunction, unspecified erectile dysfunction type Trial of Viagra.  Caution red flag signs and symptoms - sildenafil (VIAGRA) 100 MG tablet; Take 0.5-1 tablets (50-100 mg total) by mouth daily as needed for erectile dysfunction.  Dispense: 5 tablet; Refill: 11  7. Pure hypercholesterolemia He will return for fasting lipid panel tomorrow - Lipid panel; Future - Hepatic function panel; Future  8. Varicose veins of both lower extremities with inflammation -Refer to vein and vascular   Orders Placed This Encounter  Procedures   DG Lumbar Spine 2-3 Views    Standing Status:   Future    Number of Occurrences:   1    Standing Expiration Date:   08/12/2020    Order Specific Question:   Reason for Exam (SYMPTOM  OR DIAGNOSIS REQUIRED)    Answer:   back pain    Order Specific Question:   Preferred imaging location?    Answer:   Internal   MR Cervical Spine Wo Contrast    Standing Status:   Future    Standing Expiration Date:   06/12/2021    Order Specific Question:   ** REASON FOR EXAM (FREE TEXT)    Answer:   Progressive neuropathic symptoms in the RUE.    Order Specific Question:   What is the patient's sedation requirement?    Answer:   No Sedation    Order Specific Question:   Does the patient have a pacemaker or implanted devices?    Answer:   No    Order Specific Question:   Preferred imaging location?    Answer:   Internal    Order Specific Question:   Radiology Contrast Protocol - do NOT remove file path    Answer:   \[email or web address]   MR Lumbar Spine Wo Contrast  Standing Status:   Future    Standing Expiration Date:   06/12/2021    Order Specific Question:   ** REASON FOR EXAM (FREE TEXT)    Answer:   LLE with increased radicular symptoms.    Order Specific Question:   What is the patient's sedation requirement?    Answer:   No Sedation     Order Specific Question:   Does the patient have a pacemaker or implanted devices?    Answer:   No    Order Specific Question:   Preferred imaging location?    Answer:   Internal    Order Specific Question:   Radiology Contrast Protocol - do NOT remove file path    Answer:   \[email or web address]   Lipid panel    Standing Status:   Future    Standing Expiration Date:   06/12/2021   Hepatic function panel    Standing Status:   Future    Standing Expiration Date:   06/12/2021   Ambulatory referral to Vascular Surgery    Referral Priority:   Routine    Referral Type:   Surgical    Referral Reason:   Specialty Services Required    Requested Specialty:   Vascular Surgery    Number of Visits Requested:   1   Meds ordered this encounter  Medications   sildenafil (VIAGRA) 100 MG tablet    Sig: Take 0.5-1 tablets (50-100 mg total) by mouth daily as needed for erectile dysfunction.    Dispense:  5 tablet    Refill:  [place], [place] [phone]

## 2020-06-13 ENCOUNTER — Other Ambulatory Visit: Payer: Medicare Other

## 2020-06-13 ENCOUNTER — Telehealth: Payer: Self-pay

## 2020-06-13 DIAGNOSIS — E7800 Pure hypercholesterolemia, unspecified: Secondary | ICD-10-CM

## 2020-06-13 NOTE — Telephone Encounter (Signed)
Called patient in regards to MRI appointment - LMTCB

## 2020-06-14 LAB — HEPATIC FUNCTION PANEL
ALT: 25 IU/L (ref 0–44)
AST: 32 IU/L (ref 0–40)
Albumin: 4.5 g/dL (ref 3.8–4.8)
Alkaline Phosphatase: 76 IU/L (ref 48–121)
Bilirubin Total: 0.6 mg/dL (ref 0.0–1.2)
Bilirubin, Direct: 0.2 mg/dL (ref 0.00–0.40)
Total Protein: 6.5 g/dL (ref 6.0–8.5)

## 2020-06-14 LAB — LIPID PANEL
Chol/HDL Ratio: 2.6 ratio (ref 0.0–5.0)
Cholesterol, Total: 160 mg/dL (ref 100–199)
HDL: 61 mg/dL (ref >39)
LDL Chol Calc (NIH): 88 mg/dL (ref 0–99)
Triglycerides: 55 mg/dL (ref 0–149)
VLDL Cholesterol Cal: 11 mg/dL (ref 5–40)

## 2020-07-02 ENCOUNTER — Other Ambulatory Visit: Payer: Self-pay

## 2020-07-02 ENCOUNTER — Ambulatory Visit (HOSPITAL_COMMUNITY)
Admission: RE | Admit: 2020-07-02 | Discharge: 2020-07-02 | Disposition: A | Discharge status: Home / Self Care | Payer: Medicare Other | Source: Ambulatory Visit | Attending: Family Medicine | Admitting: Family Medicine

## 2020-07-02 DIAGNOSIS — M48061 Spinal stenosis, lumbar region without neurogenic claudication: Secondary | ICD-10-CM

## 2020-07-02 DIAGNOSIS — M4316 Spondylolisthesis, lumbar region: Secondary | ICD-10-CM | POA: Diagnosis not present

## 2020-07-02 DIAGNOSIS — G9589 Other specified diseases of spinal cord: Secondary | ICD-10-CM | POA: Diagnosis not present

## 2020-07-02 DIAGNOSIS — M4722 Other spondylosis with radiculopathy, cervical region: Secondary | ICD-10-CM | POA: Diagnosis not present

## 2020-07-02 DIAGNOSIS — M4726 Other spondylosis with radiculopathy, lumbar region: Secondary | ICD-10-CM | POA: Diagnosis not present

## 2020-07-02 DIAGNOSIS — M5412 Radiculopathy, cervical region: Secondary | ICD-10-CM | POA: Diagnosis not present

## 2020-07-02 DIAGNOSIS — M5416 Radiculopathy, lumbar region: Secondary | ICD-10-CM | POA: Diagnosis not present

## 2020-07-02 DIAGNOSIS — M4802 Spinal stenosis, cervical region: Secondary | ICD-10-CM | POA: Diagnosis not present

## 2020-07-02 DIAGNOSIS — M5117 Intervertebral disc disorders with radiculopathy, lumbosacral region: Secondary | ICD-10-CM | POA: Diagnosis not present

## 2020-07-02 DIAGNOSIS — M2548 Effusion, other site: Secondary | ICD-10-CM | POA: Diagnosis not present

## 2020-07-05 ENCOUNTER — Other Ambulatory Visit: Payer: Self-pay | Admitting: Family Medicine

## 2020-07-05 DIAGNOSIS — M4802 Spinal stenosis, cervical region: Secondary | ICD-10-CM

## 2020-07-05 DIAGNOSIS — M48061 Spinal stenosis, lumbar region without neurogenic claudication: Secondary | ICD-10-CM

## 2020-07-10 DIAGNOSIS — M4712 Other spondylosis with myelopathy, cervical region: Secondary | ICD-10-CM | POA: Diagnosis not present

## 2020-07-10 DIAGNOSIS — M542 Cervicalgia: Secondary | ICD-10-CM | POA: Diagnosis not present

## 2020-07-10 DIAGNOSIS — M4803 Spinal stenosis, cervicothoracic region: Secondary | ICD-10-CM | POA: Diagnosis not present

## 2020-07-10 DIAGNOSIS — M5412 Radiculopathy, cervical region: Secondary | ICD-10-CM | POA: Diagnosis not present

## 2020-07-30 DIAGNOSIS — R600 Localized edema: Secondary | ICD-10-CM | POA: Diagnosis not present

## 2020-07-30 DIAGNOSIS — I8311 Varicose veins of right lower extremity with inflammation: Secondary | ICD-10-CM | POA: Diagnosis not present

## 2020-07-30 DIAGNOSIS — I8312 Varicose veins of left lower extremity with inflammation: Secondary | ICD-10-CM | POA: Diagnosis not present

## 2020-08-29 DIAGNOSIS — M5412 Radiculopathy, cervical region: Secondary | ICD-10-CM | POA: Diagnosis not present

## 2020-08-29 DIAGNOSIS — M4803 Spinal stenosis, cervicothoracic region: Secondary | ICD-10-CM | POA: Diagnosis not present

## 2020-08-29 DIAGNOSIS — M4712 Other spondylosis with myelopathy, cervical region: Secondary | ICD-10-CM | POA: Diagnosis not present

## 2020-08-29 DIAGNOSIS — M4802 Spinal stenosis, cervical region: Secondary | ICD-10-CM | POA: Diagnosis not present

## 2020-09-13 IMAGING — DX DG CHEST 2V
3 series · 3 of 3 positions shown · non-contrast
Comparison: 06/20/2018

CLINICAL DATA: Chest pain

EXAM:
CHEST - 2 VIEW

[chest pa (1 of 2)]
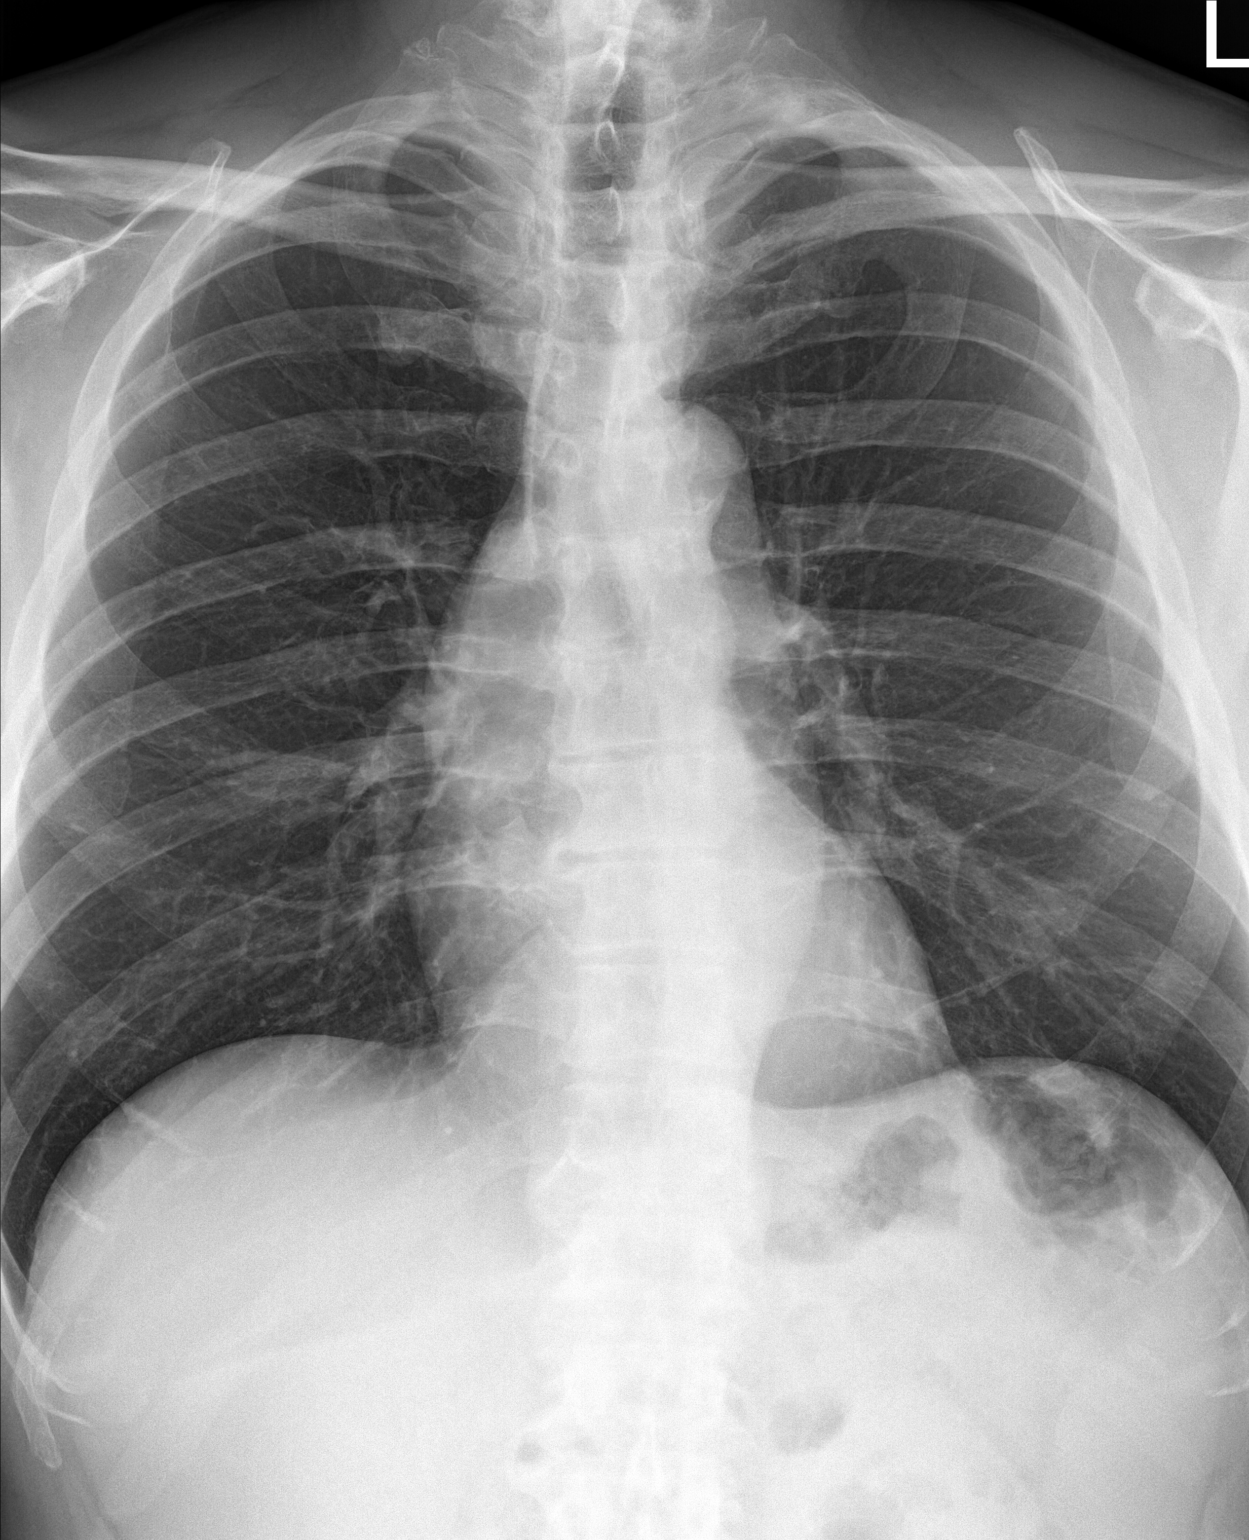

[chest lat]
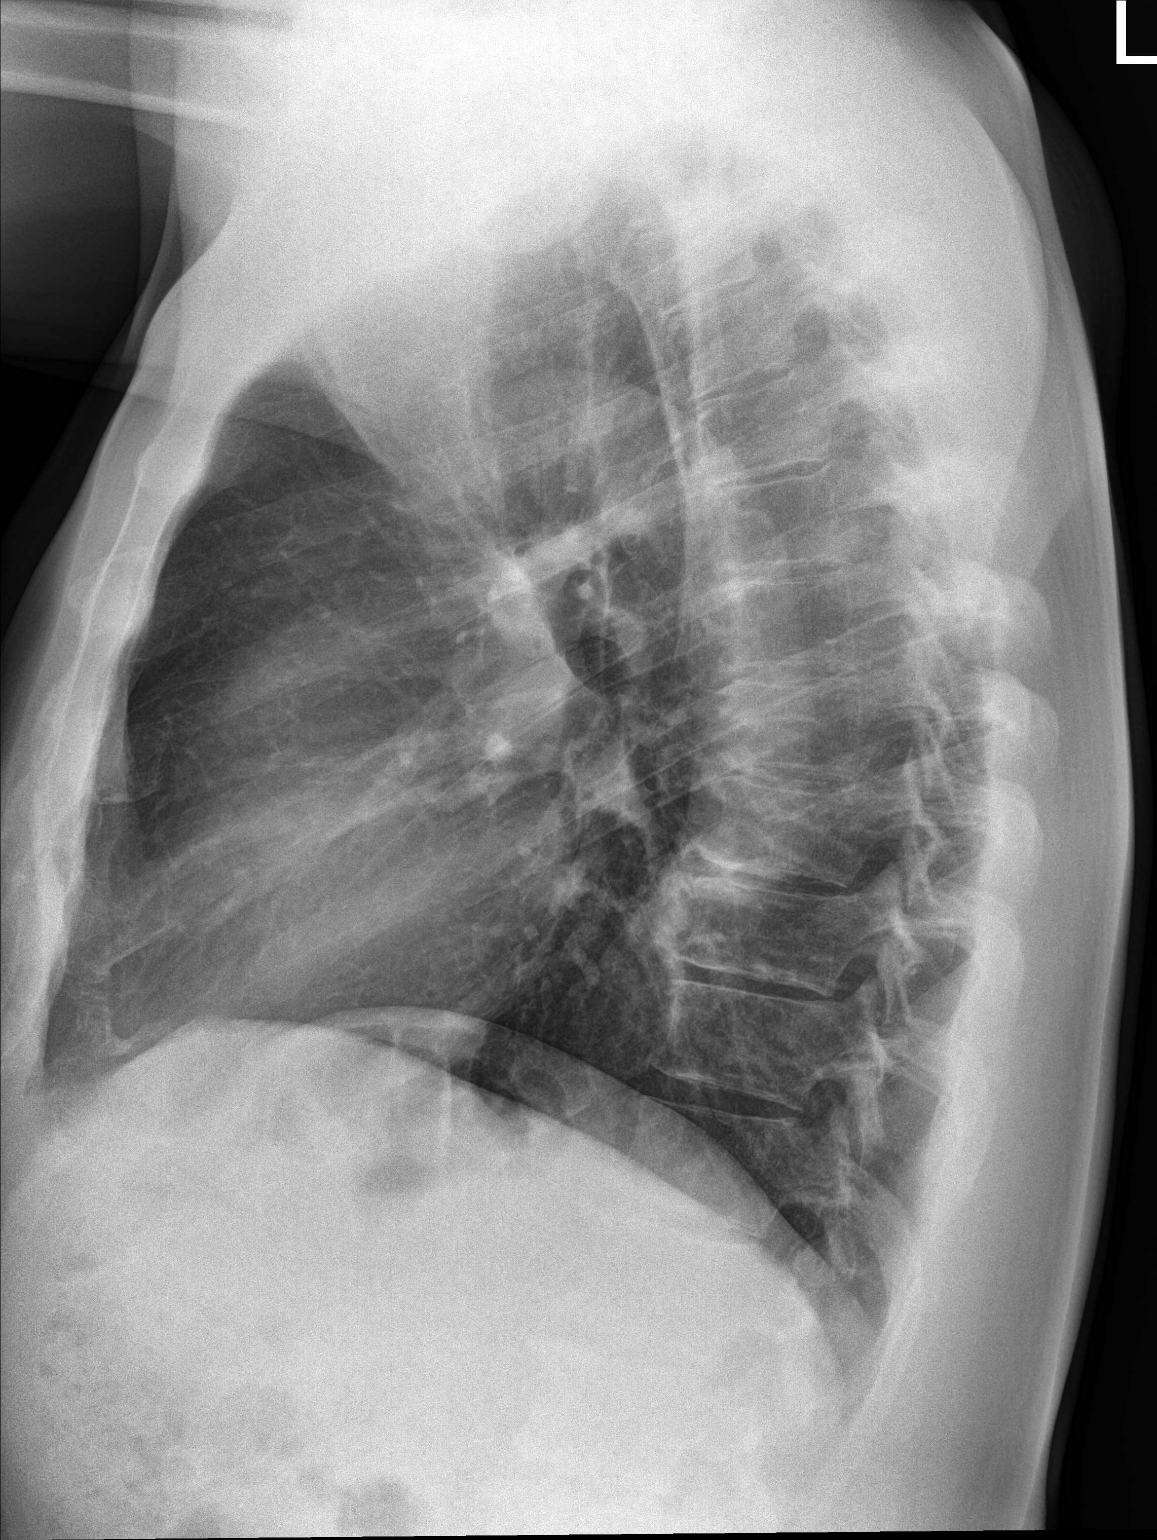

[chest pa (2 of 2)]
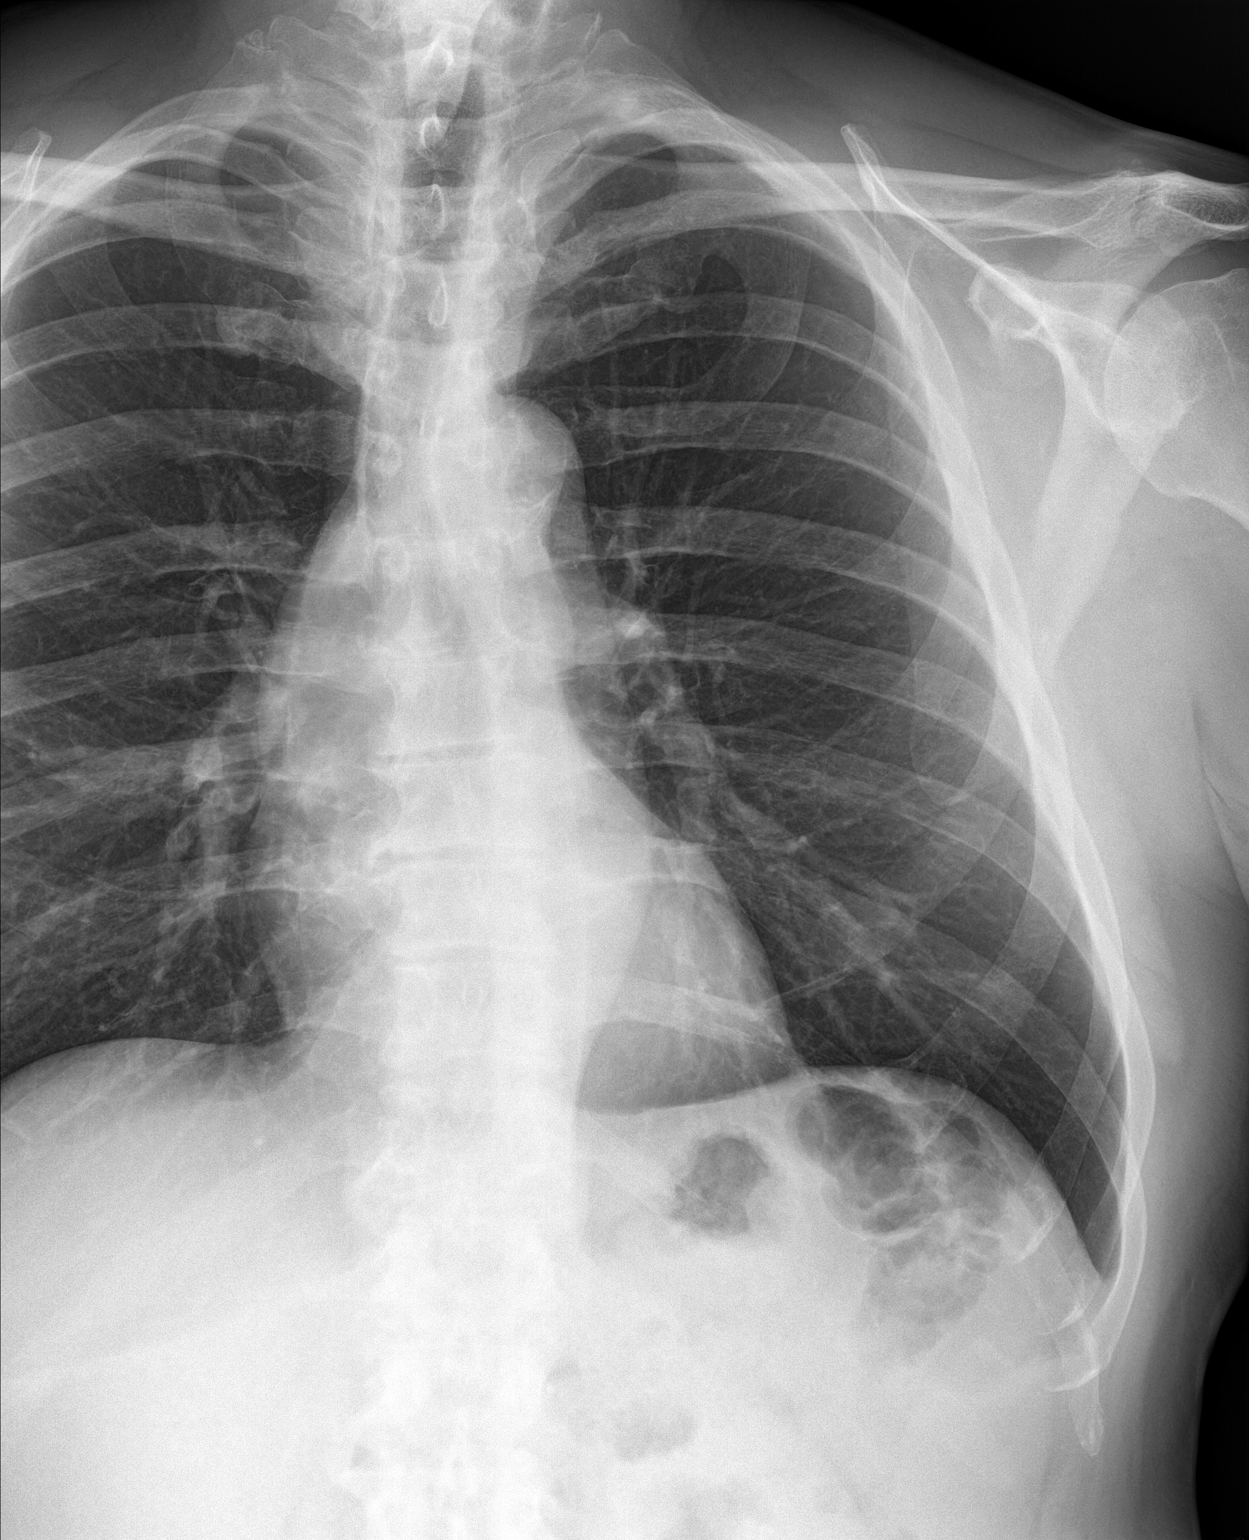

[3 of 3 positions shown; findings below may reference images not displayed]

FINDINGS: Cardiac shadow is stable. Aortic calcifications are again seen. The
lungs are well aerated bilaterally. No focal infiltrate or sizable
effusion is seen. Mild degenerative changes of the thoracic spine
are noted.
IMPRESSION: No acute abnormality noted.

## 2020-09-17 DIAGNOSIS — M542 Cervicalgia: Secondary | ICD-10-CM | POA: Diagnosis not present

## 2020-09-17 DIAGNOSIS — M6281 Muscle weakness (generalized): Secondary | ICD-10-CM | POA: Diagnosis not present

## 2020-09-19 DIAGNOSIS — M6281 Muscle weakness (generalized): Secondary | ICD-10-CM | POA: Diagnosis not present

## 2020-09-19 DIAGNOSIS — M542 Cervicalgia: Secondary | ICD-10-CM | POA: Diagnosis not present

## 2020-09-20 DIAGNOSIS — I8312 Varicose veins of left lower extremity with inflammation: Secondary | ICD-10-CM | POA: Diagnosis not present

## 2020-09-20 DIAGNOSIS — I8311 Varicose veins of right lower extremity with inflammation: Secondary | ICD-10-CM | POA: Diagnosis not present

## 2020-09-26 DIAGNOSIS — M4802 Spinal stenosis, cervical region: Secondary | ICD-10-CM | POA: Diagnosis not present

## 2020-09-26 DIAGNOSIS — M5412 Radiculopathy, cervical region: Secondary | ICD-10-CM | POA: Diagnosis not present

## 2020-09-26 DIAGNOSIS — M4803 Spinal stenosis, cervicothoracic region: Secondary | ICD-10-CM | POA: Diagnosis not present

## 2020-10-03 ENCOUNTER — Other Ambulatory Visit: Payer: Self-pay | Admitting: Family

## 2020-10-07 MED ORDER — ROSUVASTATIN CALCIUM 20 MG PO TABS: 20.0000 mg | ORAL_TABLET | Freq: Every day | ORAL | 0 refills | Status: DC

## 2020-10-07 NOTE — Addendum Note (Signed)
Addended by: Antonietta Barcelona D on: 10/07/2020 09:07 AM   Modules accepted: Orders

## 2020-10-07 NOTE — Telephone Encounter (Signed)
E-prescribe down. resent 

## 2020-10-23 DIAGNOSIS — M5416 Radiculopathy, lumbar region: Secondary | ICD-10-CM | POA: Diagnosis not present

## 2020-10-23 DIAGNOSIS — M4712 Other spondylosis with myelopathy, cervical region: Secondary | ICD-10-CM | POA: Diagnosis not present

## 2020-10-23 DIAGNOSIS — M4802 Spinal stenosis, cervical region: Secondary | ICD-10-CM | POA: Diagnosis not present

## 2020-10-23 DIAGNOSIS — M5412 Radiculopathy, cervical region: Secondary | ICD-10-CM | POA: Diagnosis not present

## 2020-10-29 DIAGNOSIS — I8312 Varicose veins of left lower extremity with inflammation: Secondary | ICD-10-CM | POA: Diagnosis not present

## 2020-10-31 DIAGNOSIS — I8312 Varicose veins of left lower extremity with inflammation: Secondary | ICD-10-CM | POA: Diagnosis not present

## 2020-11-11 DIAGNOSIS — H5232 Aniseikonia: Secondary | ICD-10-CM | POA: Diagnosis not present

## 2020-11-11 DIAGNOSIS — H2513 Age-related nuclear cataract, bilateral: Secondary | ICD-10-CM | POA: Diagnosis not present

## 2020-11-11 DIAGNOSIS — H5231 Anisometropia: Secondary | ICD-10-CM | POA: Diagnosis not present

## 2020-11-11 DIAGNOSIS — H40003 Preglaucoma, unspecified, bilateral: Secondary | ICD-10-CM | POA: Diagnosis not present

## 2020-11-12 DIAGNOSIS — I8312 Varicose veins of left lower extremity with inflammation: Secondary | ICD-10-CM | POA: Diagnosis not present

## 2020-11-20 DIAGNOSIS — M7981 Nontraumatic hematoma of soft tissue: Secondary | ICD-10-CM | POA: Diagnosis not present

## 2020-11-20 DIAGNOSIS — I8312 Varicose veins of left lower extremity with inflammation: Secondary | ICD-10-CM | POA: Diagnosis not present

## 2020-11-21 ENCOUNTER — Other Ambulatory Visit: Payer: Self-pay | Admitting: Family Medicine

## 2020-11-21 DIAGNOSIS — I10 Essential (primary) hypertension: Secondary | ICD-10-CM

## 2020-12-27 ENCOUNTER — Other Ambulatory Visit: Payer: Self-pay | Admitting: Family Medicine

## 2021-01-03 ENCOUNTER — Encounter: Payer: Self-pay | Admitting: Family Medicine

## 2021-01-03 ENCOUNTER — Other Ambulatory Visit: Payer: Self-pay

## 2021-01-03 ENCOUNTER — Ambulatory Visit (INDEPENDENT_AMBULATORY_CARE_PROVIDER_SITE_OTHER): Payer: Medicare Other | Admitting: Family Medicine

## 2021-01-03 VITALS — BP 117/50 | HR 63 | Temp 97.4°F | Wt 225.0 lb

## 2021-01-03 DIAGNOSIS — R002 Palpitations: Secondary | ICD-10-CM | POA: Diagnosis not present

## 2021-01-03 DIAGNOSIS — G629 Polyneuropathy, unspecified: Secondary | ICD-10-CM | POA: Diagnosis not present

## 2021-01-03 NOTE — Progress Notes (Signed)
Subjective: CC: Palpitations, radiculopathy PCP: Marcus Norlander, DO [name] is a 70 y.o. male presenting to clinic today for:  1.  Palpitations Patient started experiencing frequent and prolonged heart palpitations 2 days ago.  He does note that he was under a little bit more stress during that time but his symptoms typically do not last that long.  Previously evaluated by Dr. Percival Jensen about 5 years ago with cardiac monitor.  This was unremarkable at that time.  He has an appointment again scheduled on 25 January but wanted to get checked out sooner given frequency if possible.  He has been totally asymptomatic today  2. radiculopathy Patient with known cervical radiculopathy.  He has been determined not to be surgical at this time.  Previously evaluated by Dr. Jannifer Jensen and would like to reestablish care with neurology if possible.  He reports burning sensation in the hands.  He has had known peripheral neuropathy.   ROS: Per HPI  No Known Allergies Past Medical History:  Diagnosis Date  . Allergy    environmental  . Arthritis    Cervical disc disease  . GERD (gastroesophageal reflux disease)   . Hyperlipidemia   . Peripheral neuropathy   . PONV (postoperative nausea and vomiting)     Current Outpatient Medications:  .  amLODipine (NORVASC) 5 MG tablet, TAKE 1 TABLET BY MOUTH  DAILY, Disp: 90 tablet, Rfl: 0 .  b complex vitamins capsule, Take 1 capsule by mouth daily., Disp: , Rfl:  .  cholecalciferol (VITAMIN D) 1000 UNITS tablet, Take 2,000 Units by mouth daily., Disp: , Rfl:  .  Cyanocobalamin (VITAMIN B 12 PO), Take 1 tablet by mouth daily., Disp: , Rfl:  .  famotidine (PEPCID) 20 MG tablet, Take 10 mg by mouth 2 (two) times daily., Disp: , Rfl:  .  gabapentin (NEURONTIN) 400 MG capsule, Take 1 capsule (400 mg total) by mouth 3 (three) times daily., Disp: 270 capsule, Rfl: 2 .  glucosamine-chondroitin 500-400 MG tablet, Take 2 tablets by mouth 2 (two) times  daily. , Disp: , Rfl:  .  naproxen sodium (ALEVE) 220 MG tablet, Take 220 mg by mouth 2 (two) times daily as needed., Disp: , Rfl:  .  Omega-3 Fatty Acids (FISH OIL) 1000 MG CAPS, Take 2 capsules by mouth daily., Disp: , Rfl:  .  rosuvastatin (CRESTOR) 20 MG tablet, TAKE 1 TABLET BY MOUTH  DAILY, Disp: 90 tablet, Rfl: 0 .  sildenafil (VIAGRA) 100 MG tablet, Take 0.5-1 tablets (50-100 mg total) by mouth daily as needed for erectile dysfunction., Disp: 5 tablet, Rfl: 11 Social History   Socioeconomic History  . Marital status: Married    Spouse name: Not on file  . Number of children: 2  . Years of education: Not on file  . Highest education level: Not on file  Occupational History  . Occupation: Vet  Tobacco Use  . Smoking status: Former Smoker    Packs/day: 1.00    Years: 6.00    Pack years: 6.00    Types: Cigarettes    Quit date: 02/01/1990    Years since quitting: 30.9  . Smokeless tobacco: Former Systems developer    Types: [place] date: 08/20/1987  Vaping Use  . Vaping Use: Never used  Substance and Sexual Activity  . Alcohol use: Yes    Alcohol/week: 4.0 standard drinks    Types: 2 Cans of beer, 2 Glasses of wine per week  . Drug use: No  .  Sexual activity: Yes    Birth control/protection: None  Other Topics Concern  . Not on file  Social History Narrative  . Not on file   Social Determinants of Health   Financial Resource Strain: Not on file  Food Insecurity: Not on file  Transportation Needs: Not on file  Physical Activity: Not on file  Stress: Not on file  Social Connections: Not on file  Intimate Partner Violence: Not on file   Family History  Problem Relation Age of Onset  . CVA Mother 1  . CVA Father   . Cancer Brother        liver  . Colon cancer Neg Hx   . Colon polyps Neg Hx   . Esophageal cancer Neg Hx   . Rectal cancer Neg Hx   . Stomach cancer Neg Hx     Objective: Office vital signs reviewed. BP (!) 117/50   Pulse 63   Temp (!) 97.4 F (36.3  C)   Wt 225 lb (102.1 kg)   SpO2 99%   BMI 29.69 kg/m   Physical Examination:  General: Awake, alert, wekk nourished, No acute distress Cardio: bradycardic but regular Pulm: normal work of breathing on room air Psych: mood stable, speech normal, pleasant   EKG: No evidence of arrhythmia.  Assessment/ Plan: 70 y.o. male   Palpitations - Plan: EKG 12-Lead  Neuropathy - Plan: Ambulatory referral to Neurology  No appreciable arrhythmias on EKG.  Encouraged to keep follow-up with Dr. Percival Jensen for consideration of Holter monitoring.  Given strong family history of atrial fibrillation would most certainly want to rule this out.  We discussed that if it is found not to be cardiac in nature I would like him to see me again for consideration of pharmaceutical intervention for anxiety.  He seemed amenable to this plan.    Has history of peripheral neuropathy.  I suspect that the sensation that he is having in the right upper extremity is due to the spinal stenosis noted in the cervical region.  We will place referral to neurology for recheck.  We will follow-up as needed  No orders of the defined types were placed in this encounter.  No orders of the defined types were placed in this encounter.    Marcus Norlander, DO Marcus [phone]

## 2021-01-07 ENCOUNTER — Telehealth: Payer: Self-pay | Admitting: Cardiology

## 2021-01-07 NOTE — Telephone Encounter (Signed)
Spoke with patient. Patient has a history of PACs. He is experiencing fluttering in his chest when he notices a pulse deficit. He reports it is occurring hourly and has been for the last 2-3 weeks. Patient was seen in his PCP office on Friday and EKG was clear per patient.   He has some lightheadedness that occurs standing or sitting. He states his head just feels different. No chest pain or shortness of breath at this moment. Patient does report his anxiety may make the fluttering worse as he is aware of it when it occurs.   Of note patient is able to walk his mile and a half long trail without any issues.  Patient's HR is 70, BP 145/73 at time of call. Machine is not indicating an irregular rhythm.   Patient is scheduled to see Dr. Percival Spanish on 01/14/2021. Will forward to MD for review.

## 2021-01-07 NOTE — Telephone Encounter (Signed)
Patient c/o Palpitations:  High priority if patient c/o lightheadedness, shortness of breath, or chest pain  1) How long have you had palpitations/irregular HR/ Afib? Are you having the symptoms now? Flutter in chest and senses a pulse deficit. Feels like me may be going into afib. Happens about every 40-50 beats.  2) Are you currently experiencing lightheadedness, SOB or CP? Slight lightheadedness  3) Do you have a history of afib (atrial fibrillation) or irregular heart rhythm? no  4) Have you checked your BP or HR? (document readings if available): BP 130/70ish and HR 63  5) Are you experiencing any other symptoms? No. PCP did EKG but did no find anything abnormal. But, patient states that he did not feel the flutters occurring then.    New Patient of Dr. Percival Spanish, saw him last in 2017. Patient states he does not know if he should call 911 or wait to see Dr. Percival Spanish next week.

## 2021-01-07 NOTE — Telephone Encounter (Signed)
I think that it is Ok to wait to talk about it.   Thanks.

## 2021-01-09 DIAGNOSIS — R002 Palpitations: Secondary | ICD-10-CM | POA: Diagnosis not present

## 2021-01-12 NOTE — Progress Notes (Signed)
Cardiology Office Note   Date:  01/14/2021   ID:  Marcus Jensen, DOB Jul 10, 1951, MRN [id number]  PCP:  Janora Norlander, DO  Cardiologist:   No primary care provider on file. Referring:  Janora Norlander, DO  Chief Complaint  Patient presents with  . Palpitations      History of Present Illness: Marcus Jensen is a 70 y.o. male who presents for evaluation of palpitations.  He is referred by Janora Norlander, DOfor evaluation of ventricular ectopy.  I saw him in 2017.  He has had no significant past history but at that time he was having premature atrial contractions.  Couple of weeks ago he started having increased palpitations.  He was at [facility] he went to see a Designer, jewellery who found him to have premature ventricular contractions.  He was given beta-blocker.  He said this is helped.  Has had to take occasionally 50 mg instead of the 25.  He thinks that the night before he presented he was very severe because he was very anxious.  He notices them more with anxiety.  He cannot bring him on otherwise.  He has not had any presyncope or syncope.  He has not had any chest pressure, neck or arm discomfort.  He does work around his house and he was walking on [employer] without bringing on any of the symptoms.  He has no new shortness of breath, PND or orthopnea.  He has had no weight gain or edema.  Past Medical History:  Diagnosis Date  . Allergy    environmental  . Arthritis    Cervical disc disease  . GERD (gastroesophageal reflux disease)   . Hyperlipidemia   . Peripheral neuropathy   . PONV (postoperative nausea and vomiting)     Past Surgical History:  Procedure Laterality Date  . carpal tunnel Bilateral 05/2019   Dr Veronia Beets [facility])  . COLONOSCOPY    . HAND SURGERY Left    fractured metacarpal 5th digit  . HERNIA REPAIR    . INGUINAL HERNIA REPAIR Right 08/21/2016   Procedure: RIGHT INGUINAL HERNIORRHAPHY WITH MESH;  Surgeon:  Aviva Signs, MD;  Location: [facility];  Service: General;  Laterality: Right;  . KNEE SURGERY Bilateral    arthroscopy  . SKIN BIOPSY     left side nose     Current Outpatient Medications  Medication Sig Dispense Refill  . amLODipine (NORVASC) 5 MG tablet TAKE 1 TABLET BY MOUTH  DAILY 90 tablet 0  . b complex vitamins capsule Take 1 capsule by mouth daily.    . cholecalciferol (VITAMIN D) 1000 UNITS tablet Take 2,000 Units by mouth daily.    . Cyanocobalamin (VITAMIN B 12 PO) Take 1 tablet by mouth daily.    . famotidine (PEPCID) 20 MG tablet Take 10 mg by mouth 2 (two) times daily.    Marland Kitchen gabapentin (NEURONTIN) 600 MG tablet Take 600 mg by mouth 3 (three) times daily.    Marland Kitchen glucosamine-chondroitin 500-400 MG tablet Take 2 tablets by mouth 2 (two) times daily.     . naproxen sodium (ALEVE) 220 MG tablet Take 220 mg by mouth 2 (two) times daily as needed.    . Omega-3 Fatty Acids (FISH OIL) 1000 MG CAPS Take 2 capsules by mouth daily.    . rosuvastatin (CRESTOR) 20 MG tablet TAKE 1 TABLET BY MOUTH  DAILY 90 tablet 0  . sildenafil (VIAGRA) 100 MG tablet Take 0.5-1 tablets (  50-100 mg total) by mouth daily as needed for erectile dysfunction. 5 tablet 11  . metoprolol succinate (TOPROL-XL) 25 MG 24 hr tablet Take 1 tablet (25 mg total) by mouth in the morning and at bedtime. 180 tablet 3   No current facility-administered medications for this visit.    Allergies:   Patient has no known allergies.    ROS:  Please see the history of present illness.   Otherwise, review of systems are positive for none.   All other systems are reviewed and negative.    PHYSICAL EXAM: VS:  BP 114/72   Pulse 60   Ht 6\' 1"  (1.854 m)   Wt 227 lb 3.2 oz (103.1 kg)   BMI 29.98 kg/m  , BMI Body mass index is 29.98 kg/m. GENERAL:  Well appearing HEENT:  Pupils equal round and reactive, fundi not visualized, oral mucosa unremarkable NECK:  No jugular venous distention, waveform within normal limits, carotid  upstroke brisk and symmetric, no bruits, no thyromegaly LYMPHATICS:  No cervical, inguinal adenopathy LUNGS:  Clear to auscultation bilaterally BACK:  No CVA tenderness CHEST:  Unremarkable HEART:  PMI not displaced or sustained,S1 and S2 within normal limits, no S3, no S4, no clicks, no rubs, no murmurs ABD:  Flat, positive bowel sounds normal in frequency in pitch, no bruits, no rebound, no guarding, no midline pulsatile mass, no hepatomegaly, no splenomegaly EXT:  2 plus pulses throughout, no edema, no cyanosis no clubbing SKIN:  No rashes no nodules NEURO:  Cranial nerves II through XII grossly intact, motor grossly intact throughout PSYCH:  Cognitively intact, oriented to person place and time    EKG:  EKG is not ordered today. The ekg ordered 01/09/2021 demonstrates sinus rhythm, rate 66, premature ventricular contractions, axis within normal limits, intervals within normal limits, no acute ST-T wave changes.   Recent Labs: 01/26/2020: BUN 13; Creatinine, Ser 1.09; Hemoglobin 15.4; Platelets 181; Potassium 4.5; Sodium 141; TSH 0.524 06/13/2020: ALT 25    Lipid Panel    Component Value Date/Time   CHOL 160 06/13/2020 0852   TRIG 55 06/13/2020 0852   TRIG 54 11/09/2017 1014   HDL 61 06/13/2020 0852   HDL 64 11/09/2017 1014   CHOLHDL 2.6 06/13/2020 0852   LDLCALC 88 06/13/2020 0852   LDLCALC 91 06/05/2014 1137      Wt Readings from Last 3 Encounters:  01/14/21 227 lb 3.2 oz (103.1 kg)  01/03/21 225 lb (102.1 kg)  06/12/20 225 lb 9.6 oz (102.3 kg)      Other studies Reviewed: Additional studies/ records that were reviewed today include: EKG. Review of the above records demonstrates:  Please see elsewhere in the note.     ASSESSMENT AND PLAN:  PALPITATIONS: The patient is having premature ventricular contractions.  However, he is not having any symptoms with this.  We talked about getting an Alive Cor and he is invited to send me any arrhythmias he wants me to review.   However, given the fact that he had a negative treadmill test in 2019 and no other symptoms and his exam is unremarkable I would not suggest further testing.  I will defer to his primary care perhaps to check a TSH and electrolytes at the next visit.  We can also try to get labs from his nurse practitioner appointment interval follow-up.  Current medicines are reviewed at length with the patient today.  The patient does not have concerns regarding medicines.  The following changes have been made:  no change  Labs/ tests ordered today include: None No orders of the defined types were placed in this encounter.    Disposition:   FU with me in two years     Signed, Minus Breeding, MD  01/14/2021 5:26 PM    [place]

## 2021-01-14 ENCOUNTER — Encounter: Payer: Self-pay | Admitting: Cardiology

## 2021-01-14 ENCOUNTER — Ambulatory Visit: Payer: Medicare Other | Admitting: Cardiology

## 2021-01-14 ENCOUNTER — Other Ambulatory Visit: Payer: Self-pay

## 2021-01-14 VITALS — BP 114/72 | HR 60 | Ht 73.0 in | Wt 227.2 lb

## 2021-01-14 DIAGNOSIS — R002 Palpitations: Secondary | ICD-10-CM

## 2021-01-14 MED ORDER — METOPROLOL SUCCINATE ER 25 MG PO TB24: 25.0000 mg | ORAL_TABLET | Freq: Two times a day (BID) | ORAL | 3 refills | Status: DC

## 2021-01-14 NOTE — Patient Instructions (Signed)
Medication Instructions:  Metoprolol sent to [facility] for 25mg  twice a day *If you need a refill on your cardiac medications before your next appointment, please call your pharmacy*  Follow-Up: At [facility], you and your health needs are our priority.  As part of our continuing mission to provide you with exceptional heart care, we have created designated Provider Care Teams.  These Care Teams include your primary Cardiologist (physician) and Advanced Practice Providers (APPs -  Physician Assistants and Nurse Practitioners) who all work together to provide you with the care you need, when you need it.   Your next appointment:   2 year(s)  The format for your next appointment:  In Person  Provider:   Minus Breeding, MD  Other Instructions AliveCor by Jodelle Red

## 2021-01-15 ENCOUNTER — Other Ambulatory Visit: Payer: Self-pay | Admitting: Family Medicine

## 2021-01-15 ENCOUNTER — Telehealth: Payer: Self-pay | Admitting: Cardiology

## 2021-01-15 DIAGNOSIS — I10 Essential (primary) hypertension: Secondary | ICD-10-CM

## 2021-01-15 NOTE — Telephone Encounter (Signed)
Faxed signed authorization to [facility] fax number [phone] for records fro Dr. Percival Spanish to review on 01/15/21 fsw.

## 2021-01-16 NOTE — Telephone Encounter (Signed)
Gottschalk. NTBS RF'd 11/21/20 #90 to mail order

## 2021-02-12 ENCOUNTER — Other Ambulatory Visit: Payer: Self-pay | Admitting: Family Medicine

## 2021-02-12 DIAGNOSIS — I10 Essential (primary) hypertension: Secondary | ICD-10-CM

## 2021-02-13 ENCOUNTER — Other Ambulatory Visit: Payer: Self-pay | Admitting: Family Medicine

## 2021-02-13 DIAGNOSIS — I10 Essential (primary) hypertension: Secondary | ICD-10-CM

## 2021-02-13 NOTE — Telephone Encounter (Signed)
Gottschalk. NTBS 6 mos appt was to be in Dec mail order not sent

## 2021-02-14 ENCOUNTER — Other Ambulatory Visit: Payer: Self-pay | Admitting: Family Medicine

## 2021-02-14 DIAGNOSIS — I10 Essential (primary) hypertension: Secondary | ICD-10-CM

## 2021-02-28 ENCOUNTER — Other Ambulatory Visit: Payer: Self-pay | Admitting: Family Medicine

## 2021-02-28 DIAGNOSIS — I10 Essential (primary) hypertension: Secondary | ICD-10-CM

## 2021-03-04 ENCOUNTER — Other Ambulatory Visit: Payer: Self-pay | Admitting: Family Medicine

## 2021-03-04 DIAGNOSIS — I10 Essential (primary) hypertension: Secondary | ICD-10-CM

## 2021-03-13 ENCOUNTER — Encounter: Payer: Self-pay | Admitting: Family Medicine

## 2021-03-13 ENCOUNTER — Other Ambulatory Visit: Payer: Self-pay | Admitting: *Deleted

## 2021-03-13 DIAGNOSIS — I10 Essential (primary) hypertension: Secondary | ICD-10-CM

## 2021-03-13 MED ORDER — AMLODIPINE BESYLATE 5 MG PO TABS: 5.0000 mg | ORAL_TABLET | Freq: Every day | ORAL | 3 refills | Status: DC

## 2021-04-02 ENCOUNTER — Ambulatory Visit: Payer: Medicare Other | Admitting: Neurology

## 2021-04-02 ENCOUNTER — Encounter: Payer: Self-pay | Admitting: Neurology

## 2021-04-02 VITALS — BP 118/72 | HR 63 | Ht 72.0 in | Wt 225.8 lb

## 2021-04-02 DIAGNOSIS — G609 Hereditary and idiopathic neuropathy, unspecified: Secondary | ICD-10-CM

## 2021-04-02 DIAGNOSIS — M5412 Radiculopathy, cervical region: Secondary | ICD-10-CM

## 2021-04-02 NOTE — Progress Notes (Signed)
Reason for visit: Peripheral neuropathy, right arm discomfort  Referring physician: Dr. Tyson Dense is a 70 y.o. male  History of present illness:  Marcus Jensen is a 70 year old left-handed white male who apparently was seen through this practice 25 years ago.  The patient at that time was found to have a peripheral neuropathy.  The etiology of the neuropathy was not determined.  The patient works as a Animal nutritionist.  Within the last 10 years, he has developed some discomfort involving the right upper arm and shoulder.  The patient has numbness and tingling in the right lateral upper arm, he has discomfort into the shoulder itself.  He has received physical therapy and dry needling in the past with some benefit.  The patient will note at times when he has a jolt to the spine that he has a shock sensation that goes down the arm to the hand.  He does have a history of prior carpal tunnel syndrome bilaterally, he was seen by Dr. Amedeo Plenty and had bilateral carpal tunnel syndrome surgery that helped the left side but not the right.  The patient is still having tingling in the thumb, index finger and middle finger on the right hand.  He believes that he has weakness of the right arm.  He continues to have numbness in both feet but denies any balance issues or difficulty controlling the bowels or the bladder.  He does have benign prostatic enlargement with some decreased urinary stream.  The patient has numbness up to the ankles on both feet.  He has recently seen by Dr. Patrice Paradise after MRI of the cervical spine showed evidence of myelomalacia at the C3 through C6 levels.  There is no evidence of ongoing spinal cord compression, surgery was not recommended.  The patient has multilevel bilateral neuroforaminal stenosis that also includes the C5 and C6 levels bilaterally.  The patient comes to this office for further evaluation.  Past Medical History:  Diagnosis Date  . Allergy    environmental   . Arthritis    Cervical disc disease  . GERD (gastroesophageal reflux disease)   . Hyperlipidemia   . Peripheral neuropathy   . PONV (postoperative nausea and vomiting)     Past Surgical History:  Procedure Laterality Date  . carpal tunnel Bilateral 05/2019   Dr Veronia Beets [facility])  . COLONOSCOPY    . HAND SURGERY Left    fractured metacarpal 5th digit  . HERNIA REPAIR    . INGUINAL HERNIA REPAIR Right 08/21/2016   Procedure: RIGHT INGUINAL HERNIORRHAPHY WITH MESH;  Surgeon: Aviva Signs, MD;  Location: [facility];  Service: General;  Laterality: Right;  . KNEE SURGERY Bilateral    arthroscopy  . SKIN BIOPSY     left side nose    Family History  Problem Relation Age of Onset  . CVA Mother 62  . CVA Father   . Cancer Brother        liver  . Colon cancer Neg Hx   . Colon polyps Neg Hx   . Esophageal cancer Neg Hx   . Rectal cancer Neg Hx   . Stomach cancer Neg Hx     Social history:  reports that he quit smoking about 31 years ago. His smoking use included cigarettes. He has a 6.00 pack-year smoking history. He quit smokeless tobacco use about 33 years ago.  His smokeless tobacco use included chew. He reports current alcohol use of about 4.0 standard drinks of  alcohol per week. He reports that he does not use drugs.  Medications:  Prior to Admission medications   Medication Sig Start Date End Date Taking? Authorizing Provider  amLODipine (NORVASC) 5 MG tablet Take 1 tablet (5 mg total) by mouth daily. 03/13/21  Yes Ronnie Doss M, DO  b complex vitamins capsule Take 1 capsule by mouth as needed.   Yes [provider]  cholecalciferol (VITAMIN D) 1000 UNITS tablet Take 2,000 Units by mouth as needed.   Yes [provider]  Cyanocobalamin (VITAMIN B 12 PO) Take 1 tablet by mouth as needed.   Yes [provider]  famotidine (PEPCID) 20 MG tablet Take 10 mg by mouth 2 (two) times daily.   Yes [provider]  gabapentin  (NEURONTIN) 600 MG tablet Take 600 mg by mouth 3 (three) times daily. 10/23/20  Yes [provider]  glucosamine-chondroitin 500-400 MG tablet Take 2 tablets by mouth 2 (two) times daily.    Yes [provider]  metoprolol succinate (TOPROL-XL) 25 MG 24 hr tablet Take 1 tablet (25 mg total) by mouth in the morning and at bedtime. 01/14/21  Yes Minus Breeding, MD  naproxen sodium (ALEVE) 220 MG tablet Take 220 mg by mouth 2 (two) times daily as needed.   Yes [provider]  Omega-3 Fatty Acids (FISH OIL) 1000 MG CAPS Take 2 capsules by mouth daily.   Yes [provider]  rosuvastatin (CRESTOR) 20 MG tablet TAKE 1 TABLET BY MOUTH  DAILY 12/30/20  Yes Ronnie Doss M, DO  sildenafil (VIAGRA) 100 MG tablet Take 0.5-1 tablets (50-100 mg total) by mouth daily as needed for erectile dysfunction. 06/12/20  Yes Gottschalk, Leatrice Jewels M, DO     No Known Allergies  ROS:  Out of a complete 14 system review of symptoms, the patient complains only of the following symptoms, and all other reviewed systems are negative.  Numbness in the feet and right hand Weakness in the right arm  Blood pressure 118/72, pulse 63, height 6' (1.829 m), weight 225 lb 12.8 oz (102.4 kg).  Physical Exam  General: The patient is alert and cooperative at the time of the examination.  Eyes: Pupils are equal, round, and reactive to light. Discs are flat bilaterally.  Neck: The neck is supple, no carotid bruits are noted.  Respiratory: The respiratory examination is clear.  Cardiovascular: The cardiovascular examination reveals a regular rate and rhythm, no obvious murmurs or rubs are noted.  Neuromuscular: Range of movement the cervical spine is full.  Skin: Extremities are without significant edema.  Neurologic Exam  Mental status: The patient is alert and oriented x 3 at the time of the examination. The patient has apparent normal recent and remote memory, with an apparently normal  attention span and concentration ability.  Cranial nerves: Facial symmetry is present. There is good sensation of the face to pinprick and soft touch bilaterally. The strength of the facial muscles and the muscles to head turning and shoulder shrug are normal bilaterally. Speech is well enunciated, no aphasia or dysarthria is noted. Extraocular movements are full. Visual fields are full. The tongue is midline, and the patient has symmetric elevation of the soft palate. No obvious hearing deficits are noted.  Motor: The motor testing reveals 5 over 5 strength of all 4 extremities, with exception of weakness with the deltoid muscle, and with supination and external rotation of the right arm. Good symmetric motor tone is noted throughout.  Sensory: Sensory testing is  intact to pinprick, soft touch, vibration sensation, and position sense on all 4 extremities. No evidence of extinction is noted.  Coordination: Cerebellar testing reveals good finger-nose-finger and heel-to-shin bilaterally.  Gait and station: Gait is normal. Tandem gait is normal. Romberg is negative. No drift is seen.  The patient is able to walk on the heels and the toes bilaterally.  Reflexes: Deep tendon reflexes are symmetric, but are depressed bilaterally. Toes are downgoing bilaterally.   MRI cervical 02/12/12:  IMPRESSION:   1. Moderate right and mild left foraminal stenosis at C3-4.  2. Moderate to severe right and moderate left foraminal stenosis at  C4-5.  3. Moderate foraminal stenosis bilaterally at C5-6.  4. Mild central canal stenosis at C5-6.  6. Mild right foraminal stenosis at C6-7.   * MRI scan images were reviewed online. I agree with the written report.   MRI cervical 07/02/20:  IMPRESSION: 1. Evidence of spinal cord myelomalacia involving the right dorsal column from the C3 to the C6 level, and possibly with bilateral dorsal column involvement at C5-C6.  2. Widespread cervical spine  degeneration stable since 2018 with the following exception: - C5-C6 and C6-C7 increased mild multifactorial spinal stenosis and mild new cord mass effect.  3. Chronic moderate or severe neural foraminal stenosis at the bilateral C4 through C7 nerve levels. Increased moderate to severe upper thoracic foraminal stenosis since 2018.  4. New degenerative joint effusion at the C1-C2 articulation, greater on the right.  * MRI scan images were reviewed online. I agree with the written report.   MRI lumbar 07/02/20:  IMPRESSION: 1. Minimal anterolisthesis of L4 on L5. 2. Lumbar spine spondylosis most notable at L3-L4 with a right paracentral disc protrusion contacting the descending right L4 nerve root 3. Also at L3-L4 and L4-L5 with moderate to severe bilateral neural foraminal narrowing and mild central canal stenosis.     Assessment/Plan:  1.  Peripheral neuropathy  2.  Right C5 radiculopathy by clinical examination  3.  Myelomalacia at the C3 through C6 level by MRI  The patient clearly has weakness in the right arm at the C5 level, not clear whether this is related to a nerve root compression or secondary to the spinal cord injury.  The patient does have an underlying peripheral neuropathy.  Some of the sensory complaints in the right hand could be related to carpal tunnel syndrome or related to a C6 nerve root injury.  The patient will be set up for nerve conductions to involve the right arm and right leg, and EMG on the right arm.  He will follow-up for the above evaluation.  Jill Alexanders MD 04/02/2021 8:24 AM  [facility] [address] [place], [place] [address]  Phone [phone] Fax [phone]

## 2021-04-13 ENCOUNTER — Other Ambulatory Visit: Payer: Self-pay | Admitting: Family Medicine

## 2021-04-14 NOTE — Telephone Encounter (Signed)
Gottschalk. NTBS last OV 06/12/20 mail order not sent

## 2021-04-28 ENCOUNTER — Ambulatory Visit (INDEPENDENT_AMBULATORY_CARE_PROVIDER_SITE_OTHER): Payer: Medicare Other | Admitting: Neurology

## 2021-04-28 ENCOUNTER — Ambulatory Visit: Payer: Medicare Other | Admitting: Neurology

## 2021-04-28 ENCOUNTER — Encounter: Payer: Self-pay | Admitting: Neurology

## 2021-04-28 ENCOUNTER — Other Ambulatory Visit: Payer: Self-pay

## 2021-04-28 DIAGNOSIS — M5412 Radiculopathy, cervical region: Secondary | ICD-10-CM

## 2021-04-28 DIAGNOSIS — G609 Hereditary and idiopathic neuropathy, unspecified: Secondary | ICD-10-CM | POA: Diagnosis not present

## 2021-04-28 NOTE — Progress Notes (Addendum)
The patient comes in today for EMG and nerve conduction study evaluation.  The study does show a mild primarily sensory peripheral neuropathy, the patient has evidence of a right carpal tunnel syndrome.  EMG reveals evidence of a primarily chronic C5 and C6 cervical radiculopathy.  Clinical examination shows weakness primarily in the C5 nerve root distribution with a C5 dermatomal sensory alteration reported by the patient.  By EMG, the C6 nerve root clearly is involved as well.     [place]    Nerve / Sites Muscle Latency Ref. Amplitude Ref. Rel Amp Segments Distance Velocity Ref. Area    ms ms mV mV %  cm m/s m/s mVms  R Median - APB     Wrist APB 4.4 ?4.4 3.3 ?4.0 100 Wrist - APB 7   12.4     Upper arm APB 9.5  2.8  85.3 Upper arm - Wrist 26 51 ?49 9.5  R Ulnar - ADM     Wrist ADM 2.8 ?3.3 8.2 ?6.0 100 Wrist - ADM 7   28.7     B.Elbow ADM 6.7  7.6  92.5 B.Elbow - Wrist 23 59 ?49 27.3     A.Elbow ADM 8.4  7.8  104 A.Elbow - B.Elbow 10 59 ?49 27.1  R Peroneal - EDB     Ankle EDB 5.2 ?6.5 2.7 ?2.0 100 Ankle - EDB 9   7.5     Fib head EDB 12.9  2.1  80.6 Fib head - Ankle 34 44 ?44 7.3     Pop fossa EDB 15.2  2.4  112 Pop fossa - Fib head 10 44 ?44 7.6         Pop fossa - Ankle      R Tibial - AH     Ankle AH 4.7 ?5.8 4.4 ?4.0 100 Ankle - AH 9   11.3     Pop fossa AH 15.7  3.5  78.8 Pop fossa - Ankle 45 41 ?41 12.4             SNC    Nerve / Sites Rec. Site Peak Lat Ref.  Amp Ref. Segments Distance    ms ms V V  cm  R Sural - Ankle (Calf)     Calf Ankle NR ?4.4 NR ?6 Calf - Ankle 14  R Superficial peroneal - Ankle     Lat leg Ankle NR ?4.4 NR ?6 Lat leg - Ankle 14  R Median - Orthodromic (Dig II, Mid palm)     Dig II Wrist 3.7 ?3.4 6 ?10 Dig II - Wrist 13  R Ulnar - Orthodromic, (Dig V, Mid palm)     Dig V Wrist 2.6 ?3.1 6 ?5 Dig V - Wrist 28             F  Wave    Nerve F Lat Ref.   ms ms  R Tibial - AH 65.1 ?56.0  R Ulnar - ADM 30.3 ?32.0

## 2021-04-28 NOTE — Progress Notes (Signed)
Please refer to EMG and nerve conduction procedure note.  

## 2021-04-28 NOTE — Procedures (Signed)
     HISTORY:  Marcus Jensen is a 70 year old patient with a history of right arm tingling and hand tingling, and weakness in the right arm.  The patient is being evaluated for a possible cervical radiculopathy.  He does have a history of a peripheral neuropathy as well.  NERVE CONDUCTION STUDIES:  Nerve conduction studies were performed on the right upper extremity.  The distal motor latency for the right median nerve was prolonged with a low motor amplitudes seen.  The distal motor latency and motor amplitudes for the right ulnar nerve were normal with normal nerve conduction velocity seen for the right median and ulnar nerves.  The sensory latency for the right median nerve is prolonged, normal for the right ulnar nerve.  The right ulnar F-wave latency was normal.  Nerve conduction studies were performed on both lower extremities.  The distal motor latencies and motor amplitudes for the right peroneal and posterior tibial nerves were normal with normal nerve conduction velocity seen for these nerves.  The sensory latencies for the right sural and peroneal nerves were unobtainable.  The F-wave latency for the right posterior tibial nerve was prolonged.  EMG STUDIES:  EMG study was performed on the right upper extremity:  The first dorsal interosseous muscle reveals 2 to 4 K units with full recruitment. No fibrillations or positive waves were noted. The abductor pollicis brevis muscle reveals 2 to 4 K units with full recruitment. No fibrillations or positive waves were noted. The extensor indicis proprius muscle reveals 1 to 3 K units with full recruitment. No fibrillations or positive waves were noted. The pronator teres muscle reveals 2 to 5 K units with decreased recruitment. No fibrillations or positive waves were noted. The biceps muscle reveals 1 to 4 K units with decreased recruitment. No fibrillations or positive waves were noted. The triceps muscle reveals 2 to 5 K units with slightly  decreased recruitment. No fibrillations or positive waves were noted. The anterior deltoid muscle reveals 2 to 5 K units with decreased recruitment. No fibrillations or positive waves were noted. The cervical paraspinal muscles were tested at 2 levels. No abnormalities of insertional activity were seen at either level tested. There was good relaxation.   IMPRESSION:  Nerve conduction studies done on the right upper and right lower extremities shows evidence of a primarily sensory peripheral neuropathy.  There is evidence of a right carpal tunnel syndrome of mild to moderate severity.  EMG evaluation of the right upper extremity shows findings consistent with a chronic C5 and C6 radiculopathy.  Jill Alexanders MD 04/28/2021 3:10 PM  [facility] [address] [place],  [address]  Phone [phone] Fax [phone]

## 2021-05-28 DIAGNOSIS — M79601 Pain in right arm: Secondary | ICD-10-CM | POA: Insufficient documentation

## 2021-06-16 DIAGNOSIS — H5231 Anisometropia: Secondary | ICD-10-CM | POA: Diagnosis not present

## 2021-06-16 DIAGNOSIS — H5232 Aniseikonia: Secondary | ICD-10-CM | POA: Diagnosis not present

## 2021-06-16 DIAGNOSIS — H40003 Preglaucoma, unspecified, bilateral: Secondary | ICD-10-CM | POA: Diagnosis not present

## 2021-06-16 DIAGNOSIS — H2513 Age-related nuclear cataract, bilateral: Secondary | ICD-10-CM | POA: Diagnosis not present

## 2021-06-19 DIAGNOSIS — M4802 Spinal stenosis, cervical region: Secondary | ICD-10-CM | POA: Diagnosis not present

## 2021-07-15 DIAGNOSIS — M5412 Radiculopathy, cervical region: Secondary | ICD-10-CM | POA: Diagnosis not present

## 2021-07-15 DIAGNOSIS — I10 Essential (primary) hypertension: Secondary | ICD-10-CM | POA: Diagnosis not present

## 2021-07-15 DIAGNOSIS — Z6829 Body mass index (BMI) 29.0-29.9, adult: Secondary | ICD-10-CM | POA: Insufficient documentation

## 2021-07-23 DIAGNOSIS — M5412 Radiculopathy, cervical region: Secondary | ICD-10-CM | POA: Diagnosis not present

## 2021-07-23 DIAGNOSIS — M542 Cervicalgia: Secondary | ICD-10-CM | POA: Diagnosis not present

## 2021-08-11 ENCOUNTER — Telehealth: Payer: Self-pay | Admitting: *Deleted

## 2021-08-11 NOTE — Telephone Encounter (Signed)
   [facility] Pre-operative Risk Assessment    Patient Name: Marcus Jensen  DOB: Aug 05, 1951 MRN: [id number]  HEARTCARE STAFF:  - IMPORTANT!!!!!! Under Visit Info/Reason for Call, type in Other and utilize the format Clearance MM/DD/YY or Clearance TBD. Do not use dashes or single digits. - Please review there is not already an duplicate clearance open for this procedure. - If request is for dental extraction, please clarify the # of teeth to be extracted. - If the patient is currently at the dentist's office, call Pre-Op Callback Staff (MA/nurse) to input urgent request.  - If the patient is not currently in the dentist office, please route to the Pre-Op pool.  Request for surgical clearance:  What type of surgery is being performed? C4-5, C5-6 ANTERIOR CERVICAL FUSION  When is this surgery scheduled? TBD  What type of clearance is required (medical clearance vs. Pharmacy clearance to hold med vs. Both)? MEDICAL  Are there any medications that need to be held prior to surgery and how long?  NONE LISTED   Practice name and name of physician performing surgery? [facility]; DR. Sherley Bounds  What is the office phone number? [phone]   7.   What is the office fax number? [phone] ATTN:VANESSA X [phone]  8.   Anesthesia type (None, local, MAC, general) ? GENERAL   Julaine Hua 08/11/2021, 6:05 PM  _________________________________________________________________   (provider comments below)

## 2021-08-12 NOTE — Telephone Encounter (Signed)
   Name: Marcus Jensen  DOB: Jul 27, 1951  MRN: [id number]   Primary Cardiologist: Dr. Percival Spanish  Chart reviewed as part of pre-operative protocol coverage. Patient was contacted 08/12/2021 in reference to pre-operative risk assessment for pending surgery as outlined below.  NECHEMIA DEBAKER was last seen on 12/2020 by Dr. Percival Spanish with pertinent CV hx of palpitations/PVCs and normal ETT 2019. No prior echo on file or hx of CHF. RCRI 0.4% indicating low risk of CV complications. I reached out to patient for update on how he is doing. The patient affirms he has been doing well without any new cardiac symptoms. Is able to complete over 4 METS without angina or dyspnea (gives example of walking 2 miles on the beach yesterday). Therefore, based on ACC/AHA guidelines, the patient would be at acceptable risk for the planned procedure without further cardiovascular testing. The patient was advised that if he develops new symptoms prior to surgery to contact our office to arrange for a follow-up visit, and he verbalized understanding.  Per our med rec, not on antiplatelets/anticoags.  I will route this recommendation to the requesting party via Epic fax function and remove from pre-op pool. Please call with questions.  Charlie Pitter, PA-C 08/12/2021, 10:00 AM

## 2021-08-19 ENCOUNTER — Other Ambulatory Visit: Payer: Self-pay | Admitting: Neurological Surgery

## 2021-08-19 DIAGNOSIS — M5412 Radiculopathy, cervical region: Secondary | ICD-10-CM | POA: Diagnosis not present

## 2021-09-12 ENCOUNTER — Encounter (HOSPITAL_COMMUNITY): Payer: Self-pay

## 2021-09-12 NOTE — Pre-Procedure Instructions (Signed)
Surgical Instructions   Your procedure is scheduled on Wednesday, September 28th. Report to [facility] Main Entrance "A" at 05:30 A.M., then check in with the Admitting office. Call this number if you have problems the morning of surgery: [phone]   If you have any questions prior to your surgery date call [phone]: Open Monday-Friday 8am-4pm   Remember: Do not eat or drink after midnight the night before your surgery     Take these medicines the morning of surgery with A SIP OF WATER  amLODipine (NORVASC)  gabapentin (NEURONTIN)  metoprolol succinate (TOPROL-XL) rosuvastatin (CRESTOR)  If needed: famotidine (PEPCID)    As of today, STOP taking any Aspirin (unless otherwise instructed by your surgeon) Aleve, Naproxen, Ibuprofen, Motrin, Advil, Goody's, BC's, all herbal medications, fish oil, and all vitamins.                     Do NOT Smoke (Tobacco/Vaping) or drink Alcohol 24 hours prior to your procedure.  If you use a CPAP at night, you may bring all equipment for your overnight stay.   Contacts, glasses, piercing's, hearing aid's, dentures or partials may not be worn into surgery, please bring cases for these belongings.    For patients admitted to the hospital, discharge time will be determined by your treatment team.   Patients discharged the day of surgery will not be allowed to drive home, and someone needs to stay with them for 24 hours.  ONLY 1 SUPPORT PERSON MAY BE PRESENT WHILE YOU ARE IN SURGERY. IF YOU ARE TO BE ADMITTED ONCE YOU ARE IN YOUR ROOM YOU WILL BE ALLOWED TWO (2) VISITORS.  Minor children may have two parents present. Special consideration for safety and communication needs will be reviewed on a case by case basis.   Special instructions:   - Preparing For Surgery  Before surgery, you can play an important role. Because skin is not sterile, your skin needs to be as free of germs as possible. You can reduce the number of germs on  your skin by washing with CHG (chlorahexidine gluconate) Soap before surgery.  CHG is an antiseptic cleaner which kills germs and bonds with the skin to continue killing germs even after washing.    Oral Hygiene is also important to reduce your risk of infection.  Remember - BRUSH YOUR TEETH THE MORNING OF SURGERY WITH YOUR REGULAR TOOTHPASTE  Please do not use if you have an allergy to CHG or antibacterial soaps. If your skin becomes reddened/irritated stop using the CHG.  Do not shave (including legs and underarms) for at least 48 hours prior to first CHG shower. It is OK to shave your face.  Please follow these instructions carefully.   Shower the NIGHT BEFORE SURGERY and the MORNING OF SURGERY  If you chose to wash your hair, wash your hair first as usual with your normal shampoo.  After you shampoo, rinse your hair and body thoroughly to remove the shampoo.  Use CHG Soap as you would any other liquid soap. You can apply CHG directly to the skin and wash gently with a scrungie or a clean washcloth.   Apply the CHG Soap to your body ONLY FROM THE NECK DOWN.  Do not use on open wounds or open sores. Avoid contact with your eyes, ears, mouth and genitals (private parts). Wash Face and genitals (private parts)  with your normal soap.   Wash thoroughly, paying special attention to the area where your surgery  will be performed.  Thoroughly rinse your body with warm water from the neck down.  DO NOT shower/wash with your normal soap after using and rinsing off the CHG Soap.  Pat yourself dry with a CLEAN TOWEL.  Wear CLEAN PAJAMAS to bed the night before surgery  Place CLEAN SHEETS on your bed the night before your surgery  DO NOT SLEEP WITH PETS.   Day of Surgery: Shower with CHG soap. Do not wear jewelry Do not wear lotions, powders, colognes, or deodorant. Men may shave face and neck. Do not bring valuables to the hospital. [facility] is not responsible for any belongings or  valuables. Wear Clean/Comfortable clothing the morning of surgery Remember to brush your teeth WITH YOUR REGULAR TOOTHPASTE.   Please read over the following fact sheets that you were given.

## 2021-09-15 ENCOUNTER — Encounter (HOSPITAL_COMMUNITY): Payer: Self-pay

## 2021-09-15 ENCOUNTER — Encounter (HOSPITAL_COMMUNITY)
Admission: RE | Admit: 2021-09-15 | Discharge: 2021-09-15 | Disposition: A | Discharge status: Home / Self Care | Payer: Medicare Other | Source: Ambulatory Visit | Attending: Neurological Surgery | Admitting: Neurological Surgery

## 2021-09-15 ENCOUNTER — Other Ambulatory Visit: Payer: Self-pay

## 2021-09-15 DIAGNOSIS — Z79899 Other long term (current) drug therapy: Secondary | ICD-10-CM | POA: Insufficient documentation

## 2021-09-15 DIAGNOSIS — M4802 Spinal stenosis, cervical region: Secondary | ICD-10-CM | POA: Diagnosis not present

## 2021-09-15 DIAGNOSIS — Z87891 Personal history of nicotine dependence: Secondary | ICD-10-CM | POA: Insufficient documentation

## 2021-09-15 DIAGNOSIS — Z01812 Encounter for preprocedural laboratory examination: Secondary | ICD-10-CM | POA: Diagnosis not present

## 2021-09-15 DIAGNOSIS — Z20822 Contact with and (suspected) exposure to covid-19: Secondary | ICD-10-CM | POA: Diagnosis not present

## 2021-09-15 DIAGNOSIS — I10 Essential (primary) hypertension: Secondary | ICD-10-CM | POA: Diagnosis not present

## 2021-09-15 HISTORY — DX: Essential (primary) hypertension: I10

## 2021-09-15 LAB — CBC
HCT: 43.1 % (ref 39.0–52.0)
Hemoglobin: 14.3 g/dL (ref 13.0–17.0)
MCH: 31.1 pg (ref 26.0–34.0)
MCHC: 33.2 g/dL (ref 30.0–36.0)
MCV: 93.7 fL (ref 80.0–100.0)
Platelets: 205 10*3/uL (ref 150–400)
RBC: 4.6 MIL/uL (ref 4.22–5.81)
RDW: 11.9 % (ref 11.5–15.5)
WBC: 4.7 10*3/uL (ref 4.0–10.5)
nRBC: 0 % (ref 0.0–0.2)

## 2021-09-15 LAB — BASIC METABOLIC PANEL
Anion gap: 8 (ref 5–15)
BUN: 13 mg/dL (ref 8–23)
CO2: 30 mmol/L (ref 22–32)
Calcium: 9.7 mg/dL (ref 8.9–10.3)
Chloride: 101 mmol/L (ref 98–111)
Creatinine, Ser: 1.12 mg/dL (ref 0.61–1.24)
GFR, Estimated: >60 mL/min (ref >60)
Glucose, Bld: 78 mg/dL (ref 70–99)
Potassium: 4.1 mmol/L (ref 3.5–5.1)
Sodium: 139 mmol/L (ref 135–145)

## 2021-09-15 LAB — PROTIME-INR
INR: 1 (ref 0.8–1.2)
Prothrombin Time: 13.2 seconds (ref 11.4–15.2)

## 2021-09-15 LAB — SURGICAL PCR SCREEN
MRSA, PCR: NEGATIVE
Staphylococcus aureus: POSITIVE — AB

## 2021-09-15 LAB — SARS CORONAVIRUS 2 (TAT 6-24 HRS): SARS Coronavirus 2: NEGATIVE

## 2021-09-15 NOTE — Progress Notes (Signed)
PCP - Dr. Ronnie Doss Cardiologist - Dr. Percival Spanish Cardiac clearance received on 08/12/21 from Dr. Rosezella [place] office  Chest x-ray - n/a EKG - 01/09/21 Stress Test - 12/06/18  COVID TEST- 09/15/21 (outpatient in bed)   Anesthesia review: yes, sees cardiologist Received cardiac clearance 08/12/21 Per patient, sees Dr. Percival Spanish for premature ventricular contractions  Patient denies shortness of breath, fever, cough and chest pain at PAT appointment   All instructions explained to the patient, with a verbal understanding of the material. Patient agrees to go over the instructions while at home for a better understanding. Patient also instructed to self quarantine after being tested for COVID-19. The opportunity to ask questions was provided.

## 2021-09-16 NOTE — Anesthesia Preprocedure Evaluation (Addendum)
Anesthesia Evaluation  Patient identified by MRN, date of birth, ID band Patient awake    Reviewed: Allergy & Precautions, NPO status , Patient's Chart, lab work & pertinent test results, reviewed documented beta blocker date and time   History of Anesthesia Complications (+) PONV and history of anesthetic complications  Airway Mallampati: II  TM Distance: >3 FB Neck ROM: Full    Dental  (+) Dental Advisory Given   Pulmonary former smoker,    Pulmonary exam normal        Cardiovascular hypertension, Pt. on medications and Pt. on home beta blockers Normal cardiovascular exam     Neuro/Psych  Neuromuscular disease negative psych ROS   GI/Hepatic Neg liver ROS, GERD  Medicated and Controlled,  Endo/Other  negative endocrine ROS  Renal/GU negative Renal ROS     Musculoskeletal  (+) Arthritis ,   Abdominal   Peds  Hematology negative hematology ROS (+)   Anesthesia Other Findings Covid test negative   Reproductive/Obstetrics                            Anesthesia Physical Anesthesia Plan  ASA: 3  Anesthesia Plan: General   Post-op Pain Management:    Induction: Intravenous  PONV Risk Score and Plan: 3 and Treatment may vary due to age or medical condition, Ondansetron and Dexamethasone  Airway Management Planned: Oral ETT and Video Laryngoscope Planned  Additional Equipment: None  Intra-op Plan:   Post-operative Plan: Extubation in OR  Informed Consent: I have reviewed the patients History and Physical, chart, labs and discussed the procedure including the risks, benefits and alternatives for the proposed anesthesia with the patient or authorized representative who has indicated his/her understanding and acceptance.     Dental advisory given  Plan Discussed with: CRNA and Anesthesiologist  Anesthesia Plan Comments:        Anesthesia Quick Evaluation

## 2021-09-17 ENCOUNTER — Other Ambulatory Visit: Payer: Self-pay

## 2021-09-17 ENCOUNTER — Observation Stay (HOSPITAL_COMMUNITY)
Admission: RE | Admit: 2021-09-17 | Discharge: 2021-09-17 | Disposition: A | Discharge status: Home / Self Care | Payer: Medicare Other | Attending: Neurological Surgery | Admitting: Neurological Surgery

## 2021-09-17 ENCOUNTER — Ambulatory Visit (HOSPITAL_COMMUNITY): Payer: Medicare Other

## 2021-09-17 ENCOUNTER — Encounter (HOSPITAL_COMMUNITY)
Admission: RE | Disposition: A | Discharge status: Home / Self Care | Payer: Self-pay | Source: Home / Self Care | Attending: Neurological Surgery

## 2021-09-17 ENCOUNTER — Ambulatory Visit (HOSPITAL_COMMUNITY): Payer: Medicare Other | Admitting: Physician Assistant

## 2021-09-17 ENCOUNTER — Encounter (HOSPITAL_COMMUNITY): Payer: Self-pay | Admitting: Neurological Surgery

## 2021-09-17 DIAGNOSIS — M4802 Spinal stenosis, cervical region: Principal | ICD-10-CM | POA: Insufficient documentation

## 2021-09-17 DIAGNOSIS — Z419 Encounter for procedure for purposes other than remedying health state, unspecified: Secondary | ICD-10-CM

## 2021-09-17 DIAGNOSIS — M5416 Radiculopathy, lumbar region: Secondary | ICD-10-CM | POA: Diagnosis not present

## 2021-09-17 DIAGNOSIS — E785 Hyperlipidemia, unspecified: Secondary | ICD-10-CM | POA: Diagnosis not present

## 2021-09-17 DIAGNOSIS — G992 Myelopathy in diseases classified elsewhere: Secondary | ICD-10-CM | POA: Diagnosis not present

## 2021-09-17 DIAGNOSIS — M5412 Radiculopathy, cervical region: Secondary | ICD-10-CM | POA: Diagnosis not present

## 2021-09-17 DIAGNOSIS — I10 Essential (primary) hypertension: Secondary | ICD-10-CM | POA: Diagnosis not present

## 2021-09-17 DIAGNOSIS — Z87891 Personal history of nicotine dependence: Secondary | ICD-10-CM | POA: Diagnosis not present

## 2021-09-17 DIAGNOSIS — Z981 Arthrodesis status: Secondary | ICD-10-CM

## 2021-09-17 DIAGNOSIS — Z79899 Other long term (current) drug therapy: Secondary | ICD-10-CM | POA: Diagnosis not present

## 2021-09-17 DIAGNOSIS — M4322 Fusion of spine, cervical region: Secondary | ICD-10-CM | POA: Diagnosis not present

## 2021-09-17 DIAGNOSIS — M50022 Cervical disc disorder at C5-C6 level with myelopathy: Secondary | ICD-10-CM | POA: Diagnosis not present

## 2021-09-17 DIAGNOSIS — M50122 Cervical disc disorder at C5-C6 level with radiculopathy: Secondary | ICD-10-CM | POA: Diagnosis not present

## 2021-09-17 HISTORY — PX: ANTERIOR CERVICAL DECOMP/DISCECTOMY FUSION: SHX1161

## 2021-09-17 LAB — ABO/RH: ABO/RH(D): O POS

## 2021-09-17 LAB — TYPE AND SCREEN
ABO/RH(D): O POS
Antibody Screen: NEGATIVE

## 2021-09-17 SURGERY — ANTERIOR CERVICAL DECOMPRESSION/DISCECTOMY FUSION 1 LEVEL
Anesthesia: General | Site: Spine Cervical

## 2021-09-17 MED ORDER — MIDAZOLAM HCL 5 MG/5ML IJ SOLN
INTRAMUSCULAR | Status: DC | PRN
  Administered 2021-09-17: 2 mg via INTRAVENOUS

## 2021-09-17 MED ORDER — LIDOCAINE 2% (20 MG/ML) 5 ML SYRINGE
INTRAMUSCULAR | Status: DC | PRN
  Administered 2021-09-17: 60 mg via INTRAVENOUS

## 2021-09-17 MED ORDER — DEXAMETHASONE SODIUM PHOSPHATE 10 MG/ML IJ SOLN
INTRAMUSCULAR | Status: AC
  Filled 2021-09-17: qty 1

## 2021-09-17 MED ORDER — ROCURONIUM BROMIDE 10 MG/ML (PF) SYRINGE
PREFILLED_SYRINGE | INTRAVENOUS | Status: DC | PRN
  Administered 2021-09-17: 20 mg via INTRAVENOUS
  Administered 2021-09-17: 30 mg via INTRAVENOUS
  Administered 2021-09-17: 20 mg via INTRAVENOUS
  Administered 2021-09-17: 60 mg via INTRAVENOUS

## 2021-09-17 MED ORDER — SODIUM CHLORIDE 0.9% FLUSH: 3.0000 mL | INTRAVENOUS | Status: DC | PRN

## 2021-09-17 MED ORDER — OXYCODONE HCL 5 MG/5ML PO SOLN: 5.0000 mg | Freq: Once | ORAL | Status: DC | PRN

## 2021-09-17 MED ORDER — METHOCARBAMOL 500 MG PO TABS
500.0000 mg | ORAL_TABLET | Freq: Four times a day (QID) | ORAL | Status: DC | PRN
  Administered 2021-09-17: 500 mg via ORAL
  Filled 2021-09-17: qty 1

## 2021-09-17 MED ORDER — SUGAMMADEX SODIUM 200 MG/2ML IV SOLN
INTRAVENOUS | Status: DC | PRN
  Administered 2021-09-17: 200 mg via INTRAVENOUS

## 2021-09-17 MED ORDER — OXYCODONE HCL 5 MG PO TABS
5.0000 mg | ORAL_TABLET | ORAL | Status: DC | PRN
  Administered 2021-09-17: 5 mg via ORAL
  Filled 2021-09-17: qty 1

## 2021-09-17 MED ORDER — PHENYLEPHRINE HCL-NACL 20-0.9 MG/250ML-% IV SOLN
INTRAVENOUS | Status: DC | PRN
  Administered 2021-09-17: 30 ug/min via INTRAVENOUS

## 2021-09-17 MED ORDER — FENTANYL CITRATE (PF) 100 MCG/2ML IJ SOLN
INTRAMUSCULAR | Status: AC
  Filled 2021-09-17: qty 2

## 2021-09-17 MED ORDER — ONDANSETRON HCL 4 MG/2ML IJ SOLN
INTRAMUSCULAR | Status: DC | PRN
  Administered 2021-09-17: 4 mg via INTRAVENOUS

## 2021-09-17 MED ORDER — MIDAZOLAM HCL 2 MG/2ML IJ SOLN
INTRAMUSCULAR | Status: AC
  Filled 2021-09-17: qty 2

## 2021-09-17 MED ORDER — DEXAMETHASONE 4 MG PO TABS
4.0000 mg | ORAL_TABLET | Freq: Four times a day (QID) | ORAL | Status: DC
  Administered 2021-09-17: 4 mg via ORAL
  Filled 2021-09-17: qty 1

## 2021-09-17 MED ORDER — CEFAZOLIN SODIUM-DEXTROSE 2-4 GM/100ML-% IV SOLN
2.0000 g | Freq: Three times a day (TID) | INTRAVENOUS | Status: DC
  Administered 2021-09-17: 2 g via INTRAVENOUS
  Filled 2021-09-17: qty 100

## 2021-09-17 MED ORDER — ONDANSETRON HCL 4 MG/2ML IJ SOLN: 4.0000 mg | Freq: Once | INTRAMUSCULAR | Status: DC | PRN

## 2021-09-17 MED ORDER — GABAPENTIN 300 MG PO CAPS: 300.0000 mg | ORAL_CAPSULE | ORAL | Status: DC

## 2021-09-17 MED ORDER — LACTATED RINGERS IV SOLN: INTRAVENOUS | Status: DC

## 2021-09-17 MED ORDER — CHLORHEXIDINE GLUCONATE 0.12 % MT SOLN: 15.0000 mL | Freq: Once | OROMUCOSAL | Status: AC

## 2021-09-17 MED ORDER — GABAPENTIN 300 MG PO CAPS
ORAL_CAPSULE | ORAL | Status: AC
  Filled 2021-09-17: qty 1

## 2021-09-17 MED ORDER — ACETAMINOPHEN 500 MG PO TABS
ORAL_TABLET | ORAL | Status: AC
  Administered 2021-09-17: 1000 mg via ORAL
  Filled 2021-09-17: qty 2

## 2021-09-17 MED ORDER — DEXAMETHASONE SODIUM PHOSPHATE 10 MG/ML IJ SOLN: 10.0000 mg | Freq: Once | INTRAMUSCULAR | Status: DC

## 2021-09-17 MED ORDER — OXYCODONE HCL 5 MG PO TABS: 5.0000 mg | ORAL_TABLET | ORAL | 0 refills | Status: DC | PRN

## 2021-09-17 MED ORDER — LIDOCAINE HCL (PF) 2 % IJ SOLN
INTRAMUSCULAR | Status: AC
  Filled 2021-09-17: qty 5

## 2021-09-17 MED ORDER — CEFAZOLIN SODIUM-DEXTROSE 2-4 GM/100ML-% IV SOLN
INTRAVENOUS | Status: AC
  Filled 2021-09-17: qty 100

## 2021-09-17 MED ORDER — DEXAMETHASONE SODIUM PHOSPHATE 10 MG/ML IJ SOLN
INTRAMUSCULAR | Status: DC | PRN
Start: 2021-09-17 — End: 2021-09-17
  Administered 2021-09-17: 5 mg via INTRAVENOUS

## 2021-09-17 MED ORDER — CEFAZOLIN SODIUM-DEXTROSE 2-4 GM/100ML-% IV SOLN
2.0000 g | INTRAVENOUS | Status: AC
  Administered 2021-09-17: 2 g via INTRAVENOUS

## 2021-09-17 MED ORDER — SENNA 8.6 MG PO TABS: 1.0000 | ORAL_TABLET | Freq: Two times a day (BID) | ORAL | Status: DC

## 2021-09-17 MED ORDER — AMLODIPINE BESYLATE 5 MG PO TABS: 5.0000 mg | ORAL_TABLET | Freq: Every day | ORAL | Status: DC

## 2021-09-17 MED ORDER — METHOCARBAMOL 500 MG PO TABS: 500.0000 mg | ORAL_TABLET | Freq: Four times a day (QID) | ORAL | 1 refills | Status: DC | PRN

## 2021-09-17 MED ORDER — PROPOFOL 10 MG/ML IV BOLUS
INTRAVENOUS | Status: DC | PRN
  Administered 2021-09-17: 170 mg via INTRAVENOUS

## 2021-09-17 MED ORDER — PROPOFOL 500 MG/50ML IV EMUL
INTRAVENOUS | Status: DC | PRN
  Administered 2021-09-17: 25 ug/kg/min via INTRAVENOUS

## 2021-09-17 MED ORDER — PROPOFOL 10 MG/ML IV BOLUS
INTRAVENOUS | Status: AC
  Filled 2021-09-17: qty 40

## 2021-09-17 MED ORDER — ONDANSETRON HCL 4 MG/2ML IJ SOLN
INTRAMUSCULAR | Status: AC
  Filled 2021-09-17: qty 2

## 2021-09-17 MED ORDER — MORPHINE SULFATE (PF) 2 MG/ML IV SOLN: 2.0000 mg | INTRAVENOUS | Status: DC | PRN

## 2021-09-17 MED ORDER — FENTANYL CITRATE (PF) 250 MCG/5ML IJ SOLN
INTRAMUSCULAR | Status: DC | PRN
  Administered 2021-09-17: 50 ug via INTRAVENOUS
  Administered 2021-09-17: 100 ug via INTRAVENOUS

## 2021-09-17 MED ORDER — CHLORHEXIDINE GLUCONATE CLOTH 2 % EX PADS: 6.0000 | MEDICATED_PAD | Freq: Once | CUTANEOUS | Status: DC

## 2021-09-17 MED ORDER — POTASSIUM CHLORIDE IN NACL 20-0.9 MEQ/L-% IV SOLN: INTRAVENOUS | Status: DC

## 2021-09-17 MED ORDER — METHOCARBAMOL 1000 MG/10ML IJ SOLN
500.0000 mg | Freq: Four times a day (QID) | INTRAVENOUS | Status: DC | PRN
  Filled 2021-09-17: qty 5

## 2021-09-17 MED ORDER — ROCURONIUM BROMIDE 10 MG/ML (PF) SYRINGE
PREFILLED_SYRINGE | INTRAVENOUS | Status: AC
  Filled 2021-09-17: qty 10

## 2021-09-17 MED ORDER — CHLORHEXIDINE GLUCONATE 0.12 % MT SOLN
OROMUCOSAL | Status: AC
  Administered 2021-09-17: 15 mL via OROMUCOSAL
  Filled 2021-09-17: qty 15

## 2021-09-17 MED ORDER — PHENOL 1.4 % MT LIQD: 1.0000 | OROMUCOSAL | Status: DC | PRN

## 2021-09-17 MED ORDER — ONDANSETRON HCL 4 MG/2ML IJ SOLN: 4.0000 mg | Freq: Four times a day (QID) | INTRAMUSCULAR | Status: DC | PRN

## 2021-09-17 MED ORDER — BUPIVACAINE HCL (PF) 0.25 % IJ SOLN
INTRAMUSCULAR | Status: DC | PRN
  Administered 2021-09-17: 7 mL

## 2021-09-17 MED ORDER — ACETAMINOPHEN 500 MG PO TABS
1000.0000 mg | ORAL_TABLET | Freq: Four times a day (QID) | ORAL | Status: DC
  Administered 2021-09-17: 1000 mg via ORAL
  Filled 2021-09-17: qty 2

## 2021-09-17 MED ORDER — METOPROLOL SUCCINATE ER 25 MG PO TB24: 25.0000 mg | ORAL_TABLET | Freq: Every day | ORAL | Status: DC

## 2021-09-17 MED ORDER — LACTATED RINGERS IV SOLN: INTRAVENOUS | Status: DC | PRN

## 2021-09-17 MED ORDER — THROMBIN 5000 UNITS EX SOLR: OROMUCOSAL | Status: DC | PRN

## 2021-09-17 MED ORDER — 0.9 % SODIUM CHLORIDE (POUR BTL) OPTIME
TOPICAL | Status: DC | PRN
  Administered 2021-09-17: 1000 mL

## 2021-09-17 MED ORDER — SODIUM CHLORIDE 0.9% FLUSH
3.0000 mL | Freq: Two times a day (BID) | INTRAVENOUS | Status: DC
  Administered 2021-09-17: 3 mL via INTRAVENOUS

## 2021-09-17 MED ORDER — PHENYLEPHRINE 40 MCG/ML (10ML) SYRINGE FOR IV PUSH (FOR BLOOD PRESSURE SUPPORT)
PREFILLED_SYRINGE | INTRAVENOUS | Status: DC | PRN
  Administered 2021-09-17: 80 ug via INTRAVENOUS

## 2021-09-17 MED ORDER — ORAL CARE MOUTH RINSE: 15.0000 mL | Freq: Once | OROMUCOSAL | Status: AC

## 2021-09-17 MED ORDER — MENTHOL 3 MG MT LOZG: 1.0000 | LOZENGE | OROMUCOSAL | Status: DC | PRN

## 2021-09-17 MED ORDER — OXYCODONE HCL 5 MG PO TABS: 5.0000 mg | ORAL_TABLET | Freq: Once | ORAL | Status: DC | PRN

## 2021-09-17 MED ORDER — FENTANYL CITRATE (PF) 250 MCG/5ML IJ SOLN
INTRAMUSCULAR | Status: AC
  Filled 2021-09-17: qty 5

## 2021-09-17 MED ORDER — GABAPENTIN 600 MG PO TABS: 600.0000 mg | ORAL_TABLET | Freq: Three times a day (TID) | ORAL | Status: DC

## 2021-09-17 MED ORDER — EPHEDRINE SULFATE 50 MG/ML IJ SOLN
INTRAMUSCULAR | Status: DC | PRN
  Administered 2021-09-17: 5 mg via INTRAVENOUS
  Administered 2021-09-17 (×2): 2.5 mg via INTRAVENOUS

## 2021-09-17 MED ORDER — THROMBIN 5000 UNITS EX SOLR
CUTANEOUS | Status: AC
  Filled 2021-09-17: qty 5000

## 2021-09-17 MED ORDER — ACETAMINOPHEN 500 MG PO TABS: 1000.0000 mg | ORAL_TABLET | ORAL | Status: AC

## 2021-09-17 MED ORDER — ONDANSETRON HCL 4 MG PO TABS: 4.0000 mg | ORAL_TABLET | Freq: Four times a day (QID) | ORAL | Status: DC | PRN

## 2021-09-17 MED ORDER — FENTANYL CITRATE (PF) 100 MCG/2ML IJ SOLN
25.0000 ug | INTRAMUSCULAR | Status: DC | PRN
  Administered 2021-09-17: 50 ug via INTRAVENOUS
  Administered 2021-09-17 (×3): 25 ug via INTRAVENOUS

## 2021-09-17 MED ORDER — DEXAMETHASONE SODIUM PHOSPHATE 4 MG/ML IJ SOLN: 4.0000 mg | Freq: Four times a day (QID) | INTRAMUSCULAR | Status: DC

## 2021-09-17 MED ORDER — BUPIVACAINE HCL (PF) 0.25 % IJ SOLN
INTRAMUSCULAR | Status: AC
  Filled 2021-09-17: qty 30

## 2021-09-17 MED ORDER — SODIUM CHLORIDE 0.9 % IV SOLN
250.0000 mL | INTRAVENOUS | Status: DC
  Administered 2021-09-17: 250 mL via INTRAVENOUS

## 2021-09-17 SURGICAL SUPPLY — 43 items
BAG COUNTER SPONGE SURGICOUNT (BAG) ×2 IMPLANT
BAND RUBBER #18 3X1/16 STRL (MISCELLANEOUS) ×4 IMPLANT
BASKET BONE COLLECTION (BASKET) ×2 IMPLANT
BENZOIN TINCTURE PRP APPL 2/3 (GAUZE/BANDAGES/DRESSINGS) ×2 IMPLANT
BUR CARBIDE MATCH 3.0 (BURR) ×2 IMPLANT
CANISTER SUCT 3000ML PPV (MISCELLANEOUS) ×2 IMPLANT
DRAPE C-ARM 42X72 X-RAY (DRAPES) ×6 IMPLANT
DRAPE LAPAROTOMY 100X72 PEDS (DRAPES) ×2 IMPLANT
DRAPE MICROSCOPE LEICA (MISCELLANEOUS) ×2 IMPLANT
DRSG OPSITE POSTOP 3X4 (GAUZE/BANDAGES/DRESSINGS) ×2 IMPLANT
DURAPREP 6ML APPLICATOR 50/CS (WOUND CARE) ×2 IMPLANT
ELECT COATED BLADE 2.86 ST (ELECTRODE) ×2 IMPLANT
ELECT REM PT RETURN 9FT ADLT (ELECTROSURGICAL) ×2
ELECTRODE REM PT RTRN 9FT ADLT (ELECTROSURGICAL) ×1 IMPLANT
GAUZE 4X4 16PLY ~~LOC~~+RFID DBL (SPONGE) ×2 IMPLANT
GLOVE SURG ENC MOIS LTX SZ7 (GLOVE) ×2 IMPLANT
GLOVE SURG ENC MOIS LTX SZ8 (GLOVE) ×4 IMPLANT
GLOVE SURG UNDER POLY LF SZ7 (GLOVE) ×2 IMPLANT
GOWN STRL REUS W/ TWL LRG LVL3 (GOWN DISPOSABLE) ×2 IMPLANT
GOWN STRL REUS W/ TWL XL LVL3 (GOWN DISPOSABLE) ×1 IMPLANT
GOWN STRL REUS W/TWL 2XL LVL3 (GOWN DISPOSABLE) ×2 IMPLANT
GOWN STRL REUS W/TWL LRG LVL3 (GOWN DISPOSABLE) ×4
GOWN STRL REUS W/TWL XL LVL3 (GOWN DISPOSABLE) ×2
HEMOSTAT POWDER KIT SURGIFOAM (HEMOSTASIS) ×2 IMPLANT
KIT BASIN OR (CUSTOM PROCEDURE TRAY) ×2 IMPLANT
KIT TURNOVER KIT B (KITS) ×2 IMPLANT
NEEDLE HYPO 25X1 1.5 SAFETY (NEEDLE) ×2 IMPLANT
NEEDLE SPNL 20GX3.5 QUINCKE YW (NEEDLE) ×2 IMPLANT
NS IRRIG 1000ML POUR BTL (IV SOLUTION) ×2 IMPLANT
PACK LAMINECTOMY NEURO (CUSTOM PROCEDURE TRAY) ×2 IMPLANT
PIN DISTRACTION 14MM (PIN) ×4 IMPLANT
PLATE ACP 42X2 LVL NS LF SPNE (Plate) ×1 IMPLANT
PLATE ACP INSIGNIA 42 2L (Plate) ×1 IMPLANT
PUTTY BONE 1CC (Putty) ×2 IMPLANT
SCREW VA SINGLE LEAD 4X16 (Screw) ×12 IMPLANT
SPACER IDENTITI 7X18X16 7D (Spacer) ×4 IMPLANT
SPONGE INTESTINAL PEANUT (DISPOSABLE) ×2 IMPLANT
STRIP CLOSURE SKIN 1/2X4 (GAUZE/BANDAGES/DRESSINGS) ×2 IMPLANT
SUT VIC AB 3-0 SH 8-18 (SUTURE) ×2 IMPLANT
SUT VICRYL 4-0 PS2 18IN ABS (SUTURE) IMPLANT
TOWEL GREEN STERILE (TOWEL DISPOSABLE) ×2 IMPLANT
TOWEL GREEN STERILE FF (TOWEL DISPOSABLE) ×2 IMPLANT
WATER STERILE IRR 1000ML POUR (IV SOLUTION) ×2 IMPLANT

## 2021-09-17 NOTE — Anesthesia Procedure Notes (Addendum)
Procedure Name: Intubation Date/Time: 09/17/2021 7:00 AM Performed by: Glynda Jaeger, CRNA Pre-anesthesia Checklist: Patient identified, Emergency Drugs available, Suction available and Patient being monitored Patient Re-evaluated:Patient Re-evaluated prior to induction Oxygen Delivery Method: Circle System Utilized Preoxygenation: Pre-oxygenation with 100% oxygen Induction Type: IV induction Ventilation: Mask ventilation without difficulty and Oral airway inserted - appropriate to patient size Laryngoscope Size: Glidescope and 4 Grade View: Grade I Tube type: Oral Tube size: 7.5 mm Number of attempts: 1 Airway Equipment and Method: Stylet and Oral airway Placement Confirmation: ETT inserted through vocal cords under direct vision, positive ETCO2 and breath sounds checked- equal and bilateral Secured at: 22 cm Tube secured with: Tape Dental Injury: Teeth and Oropharynx as per pre-operative assessment  Comments: Atraumatic placement

## 2021-09-17 NOTE — Evaluation (Signed)
Occupational Therapy Evaluation Patient Details Name: Marcus Jensen MRN: [id number] DOB: 18-Aug-1951 Today's Date: 09/17/2021   History of Present Illness 70 yo male s/p ACDF C4-5 C5-6 PMH Arthritis HLD HTN Peripheral neuropathy PONV BIL Knee surgery inguinal hernia repair   Clinical Impression   Patient evaluated by Occupational Therapy with no further acute OT needs identified. All education has been completed and the patient has no further questions. See below for any follow-up Occupational Therapy or equipment needs. OT to sign off. Thank you for referral.        Recommendations for follow up therapy are one component of a multi-disciplinary discharge planning process, led by the attending physician.  Recommendations may be updated based on patient status, additional functional criteria and insurance authorization.   Follow Up Recommendations  No OT follow up    Equipment Recommendations  None recommended by OT    Recommendations for Other Services       Precautions / Restrictions Precautions Precautions: Cervical Precaution Comments: handout provided and reviewed in detail for cervical precautions with adls Required Braces or Orthoses:  (none)      Mobility Bed Mobility Overal bed mobility: Modified Independent             General bed mobility comments: educated on log roll for cervical precautions    Transfers Overall transfer level: Modified independent               General transfer comment: educated on minimal use of arms for pushing and pulling    Balance Overall balance assessment: Independent                                         ADL either performed or assessed with clinical judgement   ADL Overall ADL's : Modified independent    Cervical precautions ( handout provided): Educated patient on educated on oral care using cups, washing face with cloth, never to wash directly on incision site, avoid neck rotation flexion and  extension, positioning with pillows in chair for bil UE, sleeping positioning, avoiding pushing / pulling with bil UE   Pt educated on need to notify doctor / RN of swallowing changes or choking..                                     General ADL Comments: difficulty with buttons this session but with visual input able to complete task. pt reports minimal if any improvement from prior to surgery but not worse     Vision Baseline Vision/History: 1 Wears glasses Ability to See in Adequate Light: 0 Adequate Patient Visual Report: No change from baseline       Perception     Praxis      Pertinent Vitals/Pain Pain Assessment: No/denies pain     Hand Dominance Right   Extremity/Trunk Assessment Upper Extremity Assessment Upper Extremity Assessment: RUE deficits/detail;LUE deficits/detail RUE Deficits / Details: numbness and decreased sensation for fine motor task RUE Sensation: decreased light touch LUE Deficits / Details: numbness and decreased sensation for fine motor task LUE Sensation: decreased light touch   Lower Extremity Assessment Lower Extremity Assessment: Overall WFL for tasks assessed   Cervical / Trunk Assessment Cervical / Trunk Assessment: Other exceptions (s/p surgery)   Communication Communication Communication: No difficulties   Cognition Arousal/Alertness: Awake/alert Behavior  During Therapy: WFL for tasks assessed/performed Overall Cognitive Status: Within Functional Limits for tasks assessed                                     General Comments       Exercises     Shoulder Instructions      Home Living Family/patient expects to be discharged to:: Private residence Living Arrangements: Spouse/significant other Available Help at Discharge: Family;Available 24 hours/day Type of Home: House Home Access: Stairs to enter [employer] of Steps: 3   Home Layout: One level     Bathroom Shower/Tub: Emergency planning/management officer: Handicapped height     Home Equipment: None   Additional Comments: 2 DOGS 2 cats and one crazy chicken that tries to come inside      Prior Functioning/Environment Level of Independence: Independent        Comments: works as a Psychologist, clinical in [place] List: Decreased activity tolerance;Impaired balance (sitting and/or standing);Decreased strength;Impaired UE functional use      OT Treatment/Interventions:      OT Goals(Current goals can be found in the care plan section) Acute Rehab OT Goals Patient Stated Goal: to be able to use hands better Potential to Achieve Goals: Good  OT Frequency:     Barriers to D/C:            Co-evaluation              AM-PAC OT "6 Clicks" Daily Activity     Outcome Measure Help from another person eating meals?: None Help from another person taking care of personal grooming?: None Help from another person toileting, which includes using toliet, bedpan, or urinal?: None Help from another person bathing (including washing, rinsing, drying)?: None Help from another person to put on and taking off regular upper body clothing?: None Help from another person to put on and taking off regular lower body clothing?: None 6 Click Score: 24   End of Session Nurse Communication: Mobility status;Precautions  Activity Tolerance: Patient tolerated treatment well Patient left: in bed;with call bell/phone within reach;with family/visitor present  OT Visit Diagnosis: Unsteadiness on feet (R26.81);Muscle weakness (generalized) (M62.81)                Time: 4818-5631 OT Time Calculation (min): 50 min Charges:  OT General Charges $OT Visit: 1 Visit OT Evaluation $OT Eval Moderate Complexity: 1 Mod OT Treatments $Self Care/Home Management : 23-37 mins   Brynn, OTR/L  Acute Rehabilitation Services Pager: [phone] Office: [phone] .   [name] 09/17/2021, 4:26 PM

## 2021-09-17 NOTE — Discharge Summary (Signed)
Physician Discharge Summary  Patient ID: Marcus Jensen MRN: [id number] DOB/AGE: 12/25/1950 70 y.o.  Admit date: 09/17/2021 Discharge date: 09/17/2021  Admission Diagnoses: cervical stenosis   Discharge Diagnoses: same   Discharged Condition: good  Hospital Course: The patient was admitted on 09/17/2021 and taken to the operating room where the patient underwent ACDF C4-5 C5-6. The patient tolerated the procedure well and was taken to the recovery room and then to the floor in stable condition. The hospital course was routine. There were no complications. The wound remained clean dry and intact. Pt had appropriate neck soreness. No complaints of arm pain or new N/T/W. The patient remained afebrile with stable vital signs, and tolerated a regular diet. The patient continued to increase activities, and pain was well controlled with oral pain medications.   Consults: None  Significant Diagnostic Studies:  Results for orders placed or performed during the hospital encounter of 09/17/21  Type and screen [facility]  Result Value Ref Range   ABO/RH(D) O POS    Antibody Screen NEG    Sample Expiration      09/20/2021,2359 Performed at [facility], [place] [address], [place], [place] [address]   ABO/Rh  Result Value Ref Range   ABO/RH(D)      O POS Performed at [facility] [address], [place], [place] [address]     DG Cervical Spine 1 View  Result Date: 09/17/2021 CLINICAL DATA:  ACDF C4-5, C5-6 EXAM: DG CERVICAL SPINE - 1 VIEW COMPARISON:  08/19/2021 FINDINGS: First lateral intraoperative image demonstrates anterior localizing instrument overlying the C5-6 disc space anteriorly. Second lateral intraoperative image demonstrates changes of anterior fusion from C4-C6. Normal alignment. No hardware complicating feature. IMPRESSION: C4-C6 ACDF.  No visible complicating feature. Electronically Signed   By: Rolm Baptise M.D.   On: 09/17/2021 13:04   DG C-Arm  1-60 Min-No Report  Result Date: 09/17/2021 Fluoroscopy was utilized by the requesting physician.  No radiographic interpretation.   DG C-Arm 1-60 Min-No Report  Result Date: 09/17/2021 Fluoroscopy was utilized by the requesting physician.  No radiographic interpretation.    Antibiotics:  Anti-infectives (From admission, onward)    Start     Dose/Rate Route Frequency Ordered Stop   09/17/21 1600  ceFAZolin (ANCEF) IVPB 2g/100 mL premix        2 g 200 mL/hr over 30 Minutes Intravenous Every 8 hours 09/17/21 1210 09/18/21 0759   09/17/21 0600  ceFAZolin (ANCEF) IVPB 2g/100 mL premix        2 g 200 mL/hr over 30 Minutes Intravenous On call to O.R. 09/17/21 0548 09/17/21 0805   09/17/21 0555  ceFAZolin (ANCEF) 2-4 GM/100ML-% IVPB       Note to Pharmacy: Alphonsus Sias   : cabinet override      09/17/21 0555 09/17/21 1759       Discharge Exam: Blood pressure (!) 146/82, pulse 70, temperature 97.6 F (36.4 C), resp. rate 18, height 6\' 1"  (1.854 m), weight 96.6 kg, SpO2 99 %. Neurologic: Grossly normal Dressing dry  Discharge Medications:   Allergies as of 09/17/2021   No Known Allergies      Medication List     STOP taking these medications    naproxen sodium 220 MG tablet Commonly known as: ALEVE       TAKE these medications    acetaminophen 500 MG tablet Commonly known as: TYLENOL Take 1,000 mg by mouth every 6 (six) hours as needed.  amLODipine 5 MG tablet Commonly known as: NORVASC Take 1 tablet (5 mg total) by mouth daily.   famotidine 10 MG tablet Commonly known as: PEPCID Take 10 mg by mouth daily as needed for heartburn.   gabapentin 600 MG tablet Commonly known as: NEURONTIN Take 600 mg by mouth 3 (three) times daily.   glucosamine-chondroitin 500-400 MG tablet Take 2 tablets by mouth 2 (two) times daily.   methocarbamol 500 MG tablet Commonly known as: ROBAXIN Take 1 tablet (500 mg total) by mouth every 6 (six) hours as needed for muscle  spasms.   metoprolol succinate 25 MG 24 hr tablet Commonly known as: TOPROL-XL Take 1 tablet (25 mg total) by mouth in the morning and at bedtime. What changed: when to take this   oxyCODONE 5 MG immediate release tablet Commonly known as: Oxy IR/ROXICODONE Take 1 tablet (5 mg total) by mouth every 4 (four) hours as needed for moderate pain ((score 4 to 6)).   rosuvastatin 10 MG tablet Commonly known as: CRESTOR Take 10 mg by mouth daily.   rosuvastatin 20 MG tablet Commonly known as: CRESTOR TAKE 1 TABLET BY MOUTH  DAILY   sildenafil 100 MG tablet Commonly known as: Viagra Take 0.5-1 tablets (50-100 mg total) by mouth daily as needed for erectile dysfunction.   Vitamin D 50 MCG (2000 UT) tablet Take 2,000 Units by mouth in the morning and at bedtime.        Disposition: home   Final Dx: ACDF C4-5 c5-6  Discharge Instructions      Remove dressing in 72 hours   Complete by: As directed    Call MD for:  difficulty breathing, headache or visual disturbances   Complete by: As directed    Call MD for:  persistant nausea and vomiting   Complete by: As directed    Call MD for:  redness, tenderness, or signs of infection (pain, swelling, redness, odor or green/yellow discharge around incision site)   Complete by: As directed    Call MD for:  severe uncontrolled pain   Complete by: As directed    Call MD for:  temperature >100.4   Complete by: As directed    Diet - low sodium heart healthy   Complete by: As directed    Increase activity slowly   Complete by: As directed           Signed: [name] 09/17/2021, 1:26 PM

## 2021-09-17 NOTE — Plan of Care (Signed)
Patient alert and oriented, mae's well, voiding adequate amount of urine, swallowing without difficulty, no c/o pain at time of discharge. Patient discharged home with family. Script and discharged instructions given to patient. Patient and family stated understanding of instructions given. Patient has an appointment with Dr. Jones °

## 2021-09-17 NOTE — H&P (Signed)
Subjective:   Patient is a 70 y.o. male admitted for ACDF. The patient first presented to me with complaints of neck pain and numbness of the arm(s). Onset of symptoms was several years ago. The pain is described as aching and occurs all day. The pain is rated moderate, and is located in the neck and radiates to the arms. The symptoms have been progressive. Symptoms are exacerbated by extending head backwards, and are relieved by none.  Previous work up includes MRI of cervical spine, results: spinal stenosis.  Past Medical History:  Diagnosis Date   Allergy    environmental   Arthritis    Cervical disc disease   GERD (gastroesophageal reflux disease)    Hyperlipidemia    Hypertension    Peripheral neuropathy    PONV (postoperative nausea and vomiting)     Past Surgical History:  Procedure Laterality Date   carpal tunnel Bilateral 05/2019   Dr Veronia Beets [facility])   COLONOSCOPY     HAND SURGERY Left    fractured metacarpal 5th digit   HERNIA REPAIR     INGUINAL HERNIA REPAIR Right 08/21/2016   Procedure: RIGHT INGUINAL HERNIORRHAPHY WITH MESH;  Surgeon: Aviva Signs, MD;  Location: [facility];  Service: General;  Laterality: Right;   KNEE SURGERY Bilateral    arthroscopy   SKIN BIOPSY     left side nose    No Known Allergies  Social History   Tobacco Use   Smoking status: Former    Packs/day: 1.00    Years: 6.00    Pack years: 6.00    Types: Cigarettes    Quit date: 02/01/1990    Years since quitting: 31.6   Smokeless tobacco: Former    Types: Chew    Quit date: 08/20/1987  Substance Use Topics   Alcohol use: Yes    Alcohol/week: 4.0 standard drinks    Types: 2 Glasses of wine, 2 Cans of beer per week    Family History  Problem Relation Age of Onset   CVA Mother 61   CVA Father    Cancer Brother        liver   Colon cancer Neg Hx    Colon polyps Neg Hx    Esophageal cancer Neg Hx    Rectal cancer Neg Hx    Stomach cancer Neg Hx    Prior to  Admission medications   Medication Sig Start Date End Date Taking? Authorizing Provider  acetaminophen (TYLENOL) 500 MG tablet Take 1,000 mg by mouth every 6 (six) hours as needed.   Yes [provider]  amLODipine (NORVASC) 5 MG tablet Take 1 tablet (5 mg total) by mouth daily. 03/13/21  Yes Ronnie Doss M, DO  Cholecalciferol (VITAMIN D) 50 MCG (2000 UT) tablet Take 2,000 Units by mouth in the morning and at bedtime.   Yes [provider]  famotidine (PEPCID) 10 MG tablet Take 10 mg by mouth daily as needed for heartburn.   Yes [provider]  gabapentin (NEURONTIN) 600 MG tablet Take 600 mg by mouth 3 (three) times daily. 10/23/20  Yes [provider]  glucosamine-chondroitin 500-400 MG tablet Take 2 tablets by mouth 2 (two) times daily.    Yes [provider]  metoprolol succinate (TOPROL-XL) 25 MG 24 hr tablet Take 1 tablet (25 mg total) by mouth in the morning and at bedtime. Patient taking differently: Take 25 mg by mouth daily. 01/14/21  Yes Minus Breeding, MD  rosuvastatin (CRESTOR) 10 MG tablet  Take 10 mg by mouth daily.   Yes [provider]  naproxen sodium (ALEVE) 220 MG tablet Take 220 mg by mouth 2 (two) times daily as needed. Patient not taking: No sig reported    [provider]  rosuvastatin (CRESTOR) 20 MG tablet TAKE 1 TABLET BY MOUTH  DAILY Patient not taking: No sig reported 12/30/20   Janora Norlander, DO  sildenafil (VIAGRA) 100 MG tablet Take 0.5-1 tablets (50-100 mg total) by mouth daily as needed for erectile dysfunction. 06/12/20   Janora Norlander, DO     Review of Systems  Positive ROS: neg  All other systems have been reviewed and were otherwise negative with the exception of those mentioned in the HPI and as above.  Objective: Vital signs in last 24 hours: Temp:  [98.5 F (36.9 C)] 98.5 F (36.9 C) (09/28 0550) Pulse Rate:  [56] 56 (09/28 0550) Resp:  [18] 18 (09/28 0550) BP:  (138)/(72) 138/72 (09/28 0550) SpO2:  [97 %] 97 % (09/28 0550) Weight:  [96.6 kg] 96.6 kg (09/28 0550)  General Appearance: Alert, cooperative, no distress, appears stated age Head: Normocephalic, without obvious abnormality, atraumatic Eyes: PERRL, conjunctiva/corneas clear, EOM's intact      Neck: Supple, symmetrical, trachea midline, Back: Symmetric, no curvature, ROM normal, no CVA tenderness Lungs:  respirations unlabored Heart: Regular rate and rhythm Abdomen: Soft, non-tender Extremities: Extremities normal, atraumatic, no cyanosis or edema Pulses: 2+ and symmetric all extremities Skin: Skin color, texture, turgor normal, no rashes or lesions  NEUROLOGIC:  Mental status: Alert and oriented x4, no aphasia, good attention span, fund of knowledge and memory  Motor Exam - grossly normal Sensory Exam - grossly normal Reflexes: 2+ Coordination - grossly normal Gait - grossly normal Balance - grossly normal Cranial Nerves: I: smell Not tested  II: visual acuity  OS: nl    OD: nl  II: visual fields Full to confrontation  II: pupils Equal, round, reactive to light  III,VII: ptosis None  III,IV,VI: extraocular muscles  Full ROM  V: mastication Normal  V: facial light touch sensation  Normal  V,VII: corneal reflex  Present  VII: facial muscle function - upper  Normal  VII: facial muscle function - lower Normal  VIII: hearing Not tested  IX: soft palate elevation  Normal  IX,X: gag reflex Present  XI: trapezius strength  5/5  XI: sternocleidomastoid strength 5/5  XI: neck flexion strength  5/5  XII: tongue strength  Normal    Data Review Lab Results  Component Value Date   WBC 4.7 09/15/2021   HGB 14.3 09/15/2021   HCT 43.1 09/15/2021   MCV 93.7 09/15/2021   PLT 205 09/15/2021   Lab Results  Component Value Date   NA 139 09/15/2021   K 4.1 09/15/2021   CL 101 09/15/2021   CO2 30 09/15/2021   BUN 13 09/15/2021   CREATININE 1.12 09/15/2021   GLUCOSE 78  09/15/2021   Lab Results  Component Value Date   INR 1.0 09/15/2021    Assessment:   Cervical neck pain with herniated nucleus pulposus/ spondylosis/ stenosis at C4-5 c5-6. Estimated body mass index is 28.1 kg/m as calculated from the following:   Height as of this encounter: 6\' 1"  (1.854 m).   Weight as of this encounter: 96.6 kg.  Patient has failed conservative therapy. Planned surgery : ACDF C4-5 C5-6  Plan:   I explained the condition and procedure to the patient and answered any questions.  Patient wishes  to proceed with procedure as planned. Understands risks/ benefits/ and expected or typical outcomes.  [name] 09/17/2021 7:28 AM

## 2021-09-17 NOTE — Anesthesia Postprocedure Evaluation (Signed)
Anesthesia Post Note  Patient: Marcus Jensen  Procedure(s) Performed: CERVICAL FOUR-FIVE, CERVICAL FIVE-SIX ANTERIOR CERVICAL DECOMPRESSION/DISCECTOMY FUSION (Spine Cervical)     Patient location during evaluation: PACU Anesthesia Type: General Level of consciousness: awake and alert Pain management: pain level controlled Vital Signs Assessment: post-procedure vital signs reviewed and stable Respiratory status: spontaneous breathing, nonlabored ventilation and respiratory function stable Cardiovascular status: stable and blood pressure returned to baseline Anesthetic complications: no   No notable events documented.  Last Vitals:  Vitals:   09/17/21 1130 09/17/21 1153  BP: 135/78 (!) 146/82  Pulse: 66 70  Resp: 17 18  Temp:  36.4 C  SpO2: 95% 99%    Last Pain:  Vitals:   09/17/21 1115  TempSrc:   PainSc: [place]

## 2021-09-17 NOTE — Progress Notes (Signed)
Radiology was called regarding the new order for DG Cervical Spine 1 View. Per radiology, the test will be performed in OR.

## 2021-09-17 NOTE — Op Note (Signed)
09/17/2021  10:07 AM  PATIENT:  Marcus Jensen  70 y.o. male  PRE-OPERATIVE DIAGNOSIS: Cervical spinal stenosis C4-5 C5-6, right C5 and C6 radiculopathy, myelopathy  POST-OPERATIVE DIAGNOSIS:  same  PROCEDURE:  1. Decompressive anterior cervical discectomy C4-5 C5-6, 2. Anterior cervical arthrodesis C4-5 C5-6 utilizing a PTI interbody cage packed with locally harvested morcellized autologous bone graft and DBM, 3. Anterior cervical plating C4-5 C5-6 utilizing a Alphatec plate  SURGEON:  Sherley Bounds, MD  ASSISTANTS: Glenford Peers FNP  ANESTHESIA:   General  EBL: 10 ml  Total I/O In: 700 [I.V.:600; IV Piggyback:100] Out: 10 [Blood:10]  BLOOD ADMINISTERED: none  DRAINS: none  SPECIMEN:  none  INDICATION FOR PROCEDURE: This patient presented with neck pain with right arm pain for quite a long time. Imaging showed final stenosis C4-5 C5-6 with signal change in the posterior columns.  Nerve conduction study showed right C5 and C6 radiculopathy. the patient tried conservative measures without relief. Pain was debilitating. Recommended ACDF with plating. Patient understood the risks, benefits, and alternatives and potential outcomes and wished to proceed.  PROCEDURE DETAILS: Patient was brought to the operating room placed under general endotracheal anesthesia. Patient was placed in the supine position on the operating room table. The neck was prepped with Duraprep and draped in a sterile fashion.   Three cc of local anesthesia was injected and a transverse incision was made on the right side of the neck.  Dissection was carried down thru the subcutaneous tissue and the platysma was  elevated, opened, and undermined with Metzenbaum scissors.  Dissection was then carried out thru an avascular plane leaving the sternocleidomastoid carotid artery and jugular vein laterally and the trachea and esophagus medially with the assistance of my nurse practitioner. The ventral aspect of the vertebral  column was identified and a localizing x-ray was taken. The C4-5 and C5-6 level was identified and all in the room agreed with the level. The longus colli muscles were then elevated and the retractor was placed with the assistance of my nurse practitioner. The annulus was incised and the disc space entered. Discectomy was performed with micro-curettes and pituitary rongeurs. I then used the high-speed drill to drill the endplates down to the level of the posterior longitudinal ligament at both levels. The drill shavings were saved in a mucous trap for later arthrodesis. The operating microscope was draped and brought into the field provided additional magnification, illumination and visualization. Discectomy was continued posteriorly thru the disc space. Posterior longitudinal ligament was opened with a nerve hook, and then removed along with disc herniation and osteophytes, decompressing the spinal canal and thecal sac. We then continued to remove osteophytic overgrowth and disc material decompressing the neural foramina and exiting nerve roots bilaterally at C4-5 and C5-6. The scope was angled up and down to help decompress and undercut the vertebral bodies. Once the decompression was completed we could pass a nerve hook circumferentially to assure adequate decompression in the midline and in the neural foramina. So by both visualization and palpation we felt we had an adequate decompression of the neural elements. We then measured the height of the intravertebral disc space and selected a 7 millimeter PTI interbody cage packed with autograft and DBM. It was then gently positioned in the intravertebral disc spaces and countersunk. I then used a 42 mm Alphatec plate and placed 37CH variable angle screws into the vertebral bodies of each level and locked them into position. The wound was irrigated with bacitracin solution, checked for  hemostasis which was established and confirmed. Once meticulous hemostasis was  achieved, we then proceeded with closure with the assistance of my nurse practitioner. The platysma was closed with interrupted 3-0 undyed Vicryl suture, the subcuticular layer was closed with interrupted 3-0 undyed Vicryl suture. The skin edges were approximated with steristrips. The drapes were removed. A sterile dressing was applied. The patient was then awakened from general anesthesia and transferred to the recovery room in stable condition. At the end of the procedure all sponge, needle and instrument counts were correct.   PLAN OF CARE: Admit for overnight observation  PATIENT DISPOSITION:  PACU - hemodynamically stable.   Delay start of Pharmacological VTE agent (>24hrs) due to surgical blood loss or risk of bleeding:  yes

## 2021-09-17 NOTE — Transfer of Care (Signed)
Immediate Anesthesia Transfer of Care Note  Patient: Marcus Jensen  Procedure(s) Performed: CERVICAL FOUR-FIVE, CERVICAL FIVE-SIX ANTERIOR CERVICAL DECOMPRESSION/DISCECTOMY FUSION (Spine Cervical)  Patient Location: PACU  Anesthesia Type:General  Level of Consciousness: awake, alert , patient cooperative and responds to stimulation  Airway & Oxygen Therapy: Patient Spontanous Breathing and Patient connected to face mask oxygen  Post-op Assessment: Report given to RN and Post -op Vital signs reviewed and stable  Post vital signs: Reviewed and stable  Last Vitals:  Vitals Value Taken Time  BP 139/75 09/17/21 1012  Temp    Pulse 61 09/17/21 1015  Resp 14 09/17/21 1015  SpO2 100 % 09/17/21 1015  Vitals shown include unvalidated device data.  Last Pain:  Vitals:   09/17/21 0600  TempSrc:   PainSc: 2          Complications: No notable events documented.

## 2021-09-19 ENCOUNTER — Telehealth: Payer: Self-pay

## 2021-09-19 ENCOUNTER — Encounter (HOSPITAL_COMMUNITY): Payer: Self-pay | Admitting: Neurological Surgery

## 2021-09-19 NOTE — Telephone Encounter (Signed)
Transition Care Management Follow-up Telephone Call Date of discharge and from where: 09/17/2021 [facility] How have you been since you were released from the hospital? "I am vertical and eating" Any questions or concerns? No  Items Reviewed: Did the pt receive and understand the discharge instructions provided? Yes  Medications obtained and verified? Yes  Other? No  Any new allergies since your discharge? No  Dietary orders reviewed? Yes Do you have support at home? Yes   Home Care and Equipment/Supplies: Were home health services ordered? no If so, what is the name of the agency?  Has the agency set up a time to come to the patient's home? not applicable Were any new equipment or medical supplies ordered?  No What is the name of the medical supply agency?  Were you able to get the supplies/equipment? not applicable Do you have any questions related to the use of the equipment or supplies? No  Functional Questionnaire: (I = Independent and D = Dependent) ADLs: I  Bathing/Dressing- I  Meal Prep- I  Eating- I   reports a sore throat  Maintaining continence- I  Transferring/Ambulation- I  Managing Meds- I  Follow up appointments reviewed:  PCP Hospital f/u appt confirmed?  No Specialist Hospital f/u appt confirmed? Yes  Scheduled to see Neurosurgeon on 10/02/2021  Are transportation arrangements needed? No  If their condition worsens, is the pt aware to call PCP or go to the Emergency Dept.? Yes Was the patient provided with contact information for the PCP's office or ED? Yes Was to pt encouraged to call back with questions or concerns? Yes Tomasa Rand, RN, BSN, CEN [facility] [employer] [phone]

## 2021-10-30 DIAGNOSIS — M542 Cervicalgia: Secondary | ICD-10-CM | POA: Insufficient documentation

## 2021-10-30 DIAGNOSIS — M5412 Radiculopathy, cervical region: Secondary | ICD-10-CM | POA: Diagnosis not present

## 2021-11-04 ENCOUNTER — Other Ambulatory Visit: Payer: Self-pay | Admitting: Cardiology

## 2021-11-08 DIAGNOSIS — S0501XA Injury of conjunctiva and corneal abrasion without foreign body, right eye, initial encounter: Secondary | ICD-10-CM | POA: Insufficient documentation

## 2021-11-08 DIAGNOSIS — H5789 Other specified disorders of eye and adnexa: Secondary | ICD-10-CM | POA: Insufficient documentation

## 2021-11-08 DIAGNOSIS — R6889 Other general symptoms and signs: Secondary | ICD-10-CM | POA: Diagnosis not present

## 2021-11-17 DIAGNOSIS — H5231 Anisometropia: Secondary | ICD-10-CM | POA: Diagnosis not present

## 2021-11-17 DIAGNOSIS — H2513 Age-related nuclear cataract, bilateral: Secondary | ICD-10-CM | POA: Diagnosis not present

## 2021-11-17 DIAGNOSIS — H40003 Preglaucoma, unspecified, bilateral: Secondary | ICD-10-CM | POA: Diagnosis not present

## 2021-11-17 DIAGNOSIS — H5232 Aniseikonia: Secondary | ICD-10-CM | POA: Diagnosis not present

## 2021-11-26 DIAGNOSIS — H5232 Aniseikonia: Secondary | ICD-10-CM | POA: Diagnosis not present

## 2021-11-26 DIAGNOSIS — H40003 Preglaucoma, unspecified, bilateral: Secondary | ICD-10-CM | POA: Diagnosis not present

## 2021-11-26 DIAGNOSIS — H5231 Anisometropia: Secondary | ICD-10-CM | POA: Diagnosis not present

## 2021-11-26 DIAGNOSIS — H2513 Age-related nuclear cataract, bilateral: Secondary | ICD-10-CM | POA: Diagnosis not present

## 2021-12-10 ENCOUNTER — Ambulatory Visit (INDEPENDENT_AMBULATORY_CARE_PROVIDER_SITE_OTHER): Payer: Medicare Other

## 2021-12-10 VITALS — Ht 72.0 in | Wt 219.0 lb

## 2021-12-10 DIAGNOSIS — Z0000 Encounter for general adult medical examination without abnormal findings: Secondary | ICD-10-CM

## 2021-12-10 NOTE — Patient Instructions (Signed)
Marcus Jensen , Thank you for taking time to come for your Medicare Wellness Visit. I appreciate your ongoing commitment to your health goals. Please review the following plan we discussed and let me know if I can assist you in the future.   Screening recommendations/referrals: Colonoscopy: Done 01/22/2020 - Repeat in 5 years  Recommended yearly ophthalmology/optometry visit for glaucoma screening and checkup Recommended yearly dental visit for hygiene and checkup  Vaccinations: Influenza vaccine: Done 09/2021 - Repeat annually  Pneumococcal vaccine: Done 06/20/2018 & 12/16/2016 Tdap vaccine: Done 04/30/2019 - Repeat in 10 years *due Shingles vaccine: Due   Covid-19: Done - we need dates please  Advanced directives: Please bring a copy of your health care power of attorney and living will to the office to be added to your chart at your convenience.   Conditions/risks identified: Aim for 30 minutes of exercise or brisk walking each day, drink 6-8 glasses of water and eat lots of fruits and vegetables.   Next appointment: Follow up in one year for your annual wellness visit.   Preventive Care 64 Years and Older, Male  Preventive care refers to lifestyle choices and visits with your health care provider that can promote health and wellness. What does preventive care include? A yearly physical exam. This is also called an annual well check. Dental exams once or twice a year. Routine eye exams. Ask your health care provider how often you should have your eyes checked. Personal lifestyle choices, including: Daily care of your teeth and gums. Regular physical activity. Eating a healthy diet. Avoiding tobacco and drug use. Limiting alcohol use. Practicing safe sex. Taking low doses of aspirin every day. Taking vitamin and mineral supplements as recommended by your health care provider. What happens during an annual well check? The services and screenings done by your health care provider  during your annual well check will depend on your age, overall health, lifestyle risk factors, and family history of disease. Counseling  Your health care provider may ask you questions about your: Alcohol use. Tobacco use. Drug use. Emotional well-being. Home and relationship well-being. Sexual activity. Eating habits. History of falls. Memory and ability to understand (cognition). Work and work Statistician. Screening  You may have the following tests or measurements: Height, weight, and BMI. Blood pressure. Lipid and cholesterol levels. These may be checked every 5 years, or more frequently if you are over 51 years old. Skin check. Lung cancer screening. You may have this screening every year starting at age 34 if you have a 30-pack-year history of smoking and currently smoke or have quit within the past 15 years. Fecal occult blood test (FOBT) of the stool. You may have this test every year starting at age 53. Flexible sigmoidoscopy or colonoscopy. You may have a sigmoidoscopy every 5 years or a colonoscopy every 10 years starting at age 76. Prostate cancer screening. Recommendations will vary depending on your family history and other risks. Hepatitis C blood test. Hepatitis B blood test. Sexually transmitted disease (STD) testing. Diabetes screening. This is done by checking your blood sugar (glucose) after you have not eaten for a while (fasting). You may have this done every 1-3 years. Abdominal aortic aneurysm (AAA) screening. You may need this if you are a current or former smoker. Osteoporosis. You may be screened starting at age 75 if you are at high risk. Talk with your health care provider about your test results, treatment options, and if necessary, the need for more tests. Vaccines  Your  health care provider may recommend certain vaccines, such as: Influenza vaccine. This is recommended every year. Tetanus, diphtheria, and acellular pertussis (Tdap, Td) vaccine. You  may need a Td booster every 10 years. Zoster vaccine. You may need this after age 32. Pneumococcal 13-valent conjugate (PCV13) vaccine. One dose is recommended after age 44. Pneumococcal polysaccharide (PPSV23) vaccine. One dose is recommended after age 25. Talk to your health care provider about which screenings and vaccines you need and how often you need them. This information is not intended to replace advice given to you by your health care provider. Make sure you discuss any questions you have with your health care provider. Document Released: 01/03/2016 Document Revised: 08/26/2016 Document Reviewed: 10/08/2015 Elsevier Interactive Patient Education  2017 [employer] Prevention in the Home Falls can cause injuries. They can happen to people of all ages. There are many things you can do to make your home safe and to help prevent falls. What can I do on the outside of my home? Regularly fix the edges of walkways and driveways and fix any cracks. Remove anything that might make you trip as you walk through a door, such as a raised step or threshold. Trim any bushes or trees on the path to your home. Use bright outdoor lighting. Clear any walking paths of anything that might make someone trip, such as rocks or tools. Regularly check to see if handrails are loose or broken. Make sure that both sides of any steps have handrails. Any raised decks and porches should have guardrails on the edges. Have any leaves, snow, or ice cleared regularly. Use sand or salt on walking paths during winter. Clean up any spills in your garage right away. This includes oil or grease spills. What can I do in the bathroom? Use night lights. Install grab bars by the toilet and in the tub and shower. Do not use towel bars as grab bars. Use non-skid mats or decals in the tub or shower. If you need to sit down in the shower, use a plastic, non-slip stool. Keep the floor dry. Clean up any water that spills  on the floor as soon as it happens. Remove soap buildup in the tub or shower regularly. Attach bath mats securely with double-sided non-slip rug tape. Do not have throw rugs and other things on the floor that can make you trip. What can I do in the bedroom? Use night lights. Make sure that you have a light by your bed that is easy to reach. Do not use any sheets or blankets that are too big for your bed. They should not hang down onto the floor. Have a firm chair that has side arms. You can use this for support while you get dressed. Do not have throw rugs and other things on the floor that can make you trip. What can I do in the kitchen? Clean up any spills right away. Avoid walking on wet floors. Keep items that you use a lot in easy-to-reach places. If you need to reach something above you, use a strong step stool that has a grab bar. Keep electrical cords out of the way. Do not use floor polish or wax that makes floors slippery. If you must use wax, use non-skid floor wax. Do not have throw rugs and other things on the floor that can make you trip. What can I do with my stairs? Do not leave any items on the stairs. Make sure that there are handrails  on both sides of the stairs and use them. Fix handrails that are broken or loose. Make sure that handrails are as long as the stairways. Check any carpeting to make sure that it is firmly attached to the stairs. Fix any carpet that is loose or worn. Avoid having throw rugs at the top or bottom of the stairs. If you do have throw rugs, attach them to the floor with carpet tape. Make sure that you have a light switch at the top of the stairs and the bottom of the stairs. If you do not have them, ask someone to add them for you. What else can I do to help prevent falls? Wear shoes that: Do not have high heels. Have rubber bottoms. Are comfortable and fit you well. Are closed at the toe. Do not wear sandals. If you use a stepladder: Make  sure that it is fully opened. Do not climb a closed stepladder. Make sure that both sides of the stepladder are locked into place. Ask someone to hold it for you, if possible. Clearly mark and make sure that you can see: Any grab bars or handrails. First and last steps. Where the edge of each step is. Use tools that help you move around (mobility aids) if they are needed. These include: Canes. Walkers. Scooters. Crutches. Turn on the lights when you go into a dark area. Replace any light bulbs as soon as they burn out. Set up your furniture so you have a clear path. Avoid moving your furniture around. If any of your floors are uneven, fix them. If there are any pets around you, be aware of where they are. Review your medicines with your doctor. Some medicines can make you feel dizzy. This can increase your chance of falling. Ask your doctor what other things that you can do to help prevent falls. This information is not intended to replace advice given to you by your health care provider. Make sure you discuss any questions you have with your health care provider. Document Released: 10/03/2009 Document Revised: 05/14/2016 Document Reviewed: 01/11/2015 Elsevier Interactive Patient Education  2017 [employer].

## 2021-12-10 NOTE — Progress Notes (Signed)
Subjective:   KRISTOFF COONRADT is a 70 y.o. male who presents for an Initial Medicare Annual Wellness Visit. Virtual Visit via Telephone Note  I connected with  SLATON REASER on 12/10/21 at  2:00 PM EST by telephone and verified that I am speaking with the correct person using two identifiers.  Location: Patient: Home Provider: [facility] Persons participating in the virtual visit: patient/Nurse Health Advisor   I discussed the limitations, risks, security and privacy concerns of performing an evaluation and management service by telephone and the availability of in person appointments. The patient expressed understanding and agreed to proceed.  Interactive audio and video telecommunications were attempted between this nurse and patient, however failed, due to patient having technical difficulties OR patient did not have access to video capability.  We continued and completed visit with audio only.  Some vital signs may be absent or patient reported.   Pocahontas Cohenour E Crit Obremski, LPN   Review of Systems     Cardiac Risk Factors include: advanced age (>22men, >75 women);male gender;hypertension;dyslipidemia     Objective:    Today's Vitals   12/10/21 1407  Weight: 219 lb (99.3 kg)  Height: 6' (1.829 m)   Body mass index is 29.7 kg/m.  Advanced Directives 12/10/2021 09/15/2021 08/19/2016  Does Patient Have a Medical Advance Directive? Yes Yes Yes  Type of Paramedic of [place];Living will [place];Living will [place]  Does patient want to make changes to medical advance directive? - No - Patient declined -  Copy of [place] in Chart? No - copy requested No - copy requested No - copy requested    Current Medications (verified) Outpatient Encounter Medications as of 12/10/2021  Medication Sig   acetaminophen (TYLENOL) 500 MG tablet Take 1,000 mg by mouth every 6 (six) hours as needed.   amLODipine (NORVASC)  5 MG tablet Take 1 tablet (5 mg total) by mouth daily.   Cholecalciferol (VITAMIN D) 50 MCG (2000 UT) tablet Take 2,000 Units by mouth in the morning and at bedtime.   famotidine (PEPCID) 10 MG tablet Take 10 mg by mouth daily as needed for heartburn.   gabapentin (NEURONTIN) 600 MG tablet Take 600 mg by mouth 3 (three) times daily.   glucosamine-chondroitin 500-400 MG tablet Take 2 tablets by mouth 2 (two) times daily.    methocarbamol (ROBAXIN) 500 MG tablet Take 1 tablet (500 mg total) by mouth every 6 (six) hours as needed for muscle spasms.   metoprolol succinate (TOPROL-XL) 25 MG 24 hr tablet TAKE 1 TABLET BY MOUTH IN  THE MORNING AND AT BEDTIME   oxyCODONE (OXY IR/ROXICODONE) 5 MG immediate release tablet Take 1 tablet (5 mg total) by mouth every 4 (four) hours as needed for moderate pain ((score 4 to 6)).   oxyCODONE-acetaminophen (PERCOCET/ROXICET) 5-325 MG tablet Take by mouth.   rosuvastatin (CRESTOR) 10 MG tablet Take 10 mg by mouth daily.   rosuvastatin (CRESTOR) 20 MG tablet TAKE 1 TABLET BY MOUTH  DAILY   sildenafil (VIAGRA) 100 MG tablet Take 0.5-1 tablets (50-100 mg total) by mouth daily as needed for erectile dysfunction.   No facility-administered encounter medications on file as of 12/10/2021.    Allergies (verified) Patient has no known allergies.   History: Past Medical History:  Diagnosis Date   Allergy    environmental   Arthritis    Cervical disc disease   GERD (gastroesophageal reflux disease)    Hyperlipidemia  Hypertension    Peripheral neuropathy    PONV (postoperative nausea and vomiting)    Past Surgical History:  Procedure Laterality Date   ANTERIOR CERVICAL DECOMP/DISCECTOMY FUSION N/A 09/17/2021   Procedure: CERVICAL FOUR-FIVE, CERVICAL FIVE-SIX ANTERIOR CERVICAL DECOMPRESSION/DISCECTOMY FUSION;  Surgeon: [name], MD;  Location: [facility];  Service: Neurosurgery;  Laterality: N/A;  3C   carpal tunnel Bilateral 05/2019   Dr Veronia Beets ([facility])   COLONOSCOPY     HAND SURGERY Left    fractured metacarpal 5th digit   HERNIA REPAIR     INGUINAL HERNIA REPAIR Right 08/21/2016   Procedure: RIGHT INGUINAL HERNIORRHAPHY WITH MESH;  Surgeon: Aviva Signs, MD;  Location: [facility];  Service: General;  Laterality: Right;   KNEE SURGERY Bilateral    arthroscopy   SKIN BIOPSY     left side nose   Family History  Problem Relation Age of Onset   CVA Mother 53   CVA Father    Cancer Brother        liver   Colon cancer Neg Hx    Colon polyps Neg Hx    Esophageal cancer Neg Hx    Rectal cancer Neg Hx    Stomach cancer Neg Hx    Social History   Socioeconomic History   Marital status: Married    Spouse name: Not on file   Number of children: 2   Years of education: Not on file   Highest education level: Not on file  Occupational History   Occupation: Vet    Comment: retired/part time  Tobacco Use   Smoking status: Former    Packs/day: 1.00    Years: 6.00    Pack years: 6.00    Types: Cigarettes    Quit date: 02/01/1990    Years since quitting: 31.8   Smokeless tobacco: Former    Types: Chew    Quit date: 08/20/1987  Vaping Use   Vaping Use: Never used  Substance and Sexual Activity   Alcohol use: Yes    Alcohol/week: 4.0 standard drinks    Types: 2 Glasses of wine, 2 Cans of beer per week   Drug use: No   Sexual activity: Yes    Birth control/protection: None  Other Topics Concern   Not on file  Social History Narrative   Lives with wife   Left Handed   Drinks 4-5 cups caffeine daily   Social Determinants of Health   Financial Resource Strain: Low Risk    Difficulty of Paying Living Expenses: Not hard at all  Food Insecurity: No Food Insecurity   Worried About Charity fundraiser in the Last Year: Never true   Arboriculturist in the Last Year: Never true  Transportation Needs: No Transportation Needs   Lack of Transportation (Medical): No   Lack of Transportation (Non-Medical): No  Physical  Activity: Sufficiently Active   Days of Exercise per Week: 5 days   Minutes of Exercise per Session: 60 min  Stress: No Stress Concern Present   Feeling of Stress : Not at all  Social Connections: Socially Integrated   Frequency of Communication with Friends and Family: More than three times a week   Frequency of Social Gatherings with Friends and Family: More than three times a week   Attends Religious Services: More than 4 times per year   Active Member of [employer] or Organizations: Yes   Attends Archivist Meetings: 1 to 4 times per year  Marital Status: Married    Tobacco Counseling Counseling given: Not Answered   Clinical Intake:  Pre-visit preparation completed: Yes  Pain : No/denies pain     BMI - recorded: 29.7 Nutritional Status: BMI 25 -29 Overweight Nutritional Risks: None Diabetes: No  How often do you need to have someone help you when you read instructions, pamphlets, or other written materials from your doctor or pharmacy?: 1 - Never  Diabetic? no  Interpreter Needed?: No  Information entered by :: Jniya Madara, LPN   Activities of Daily Living In your present state of health, do you have any difficulty performing the following activities: 12/10/2021 09/15/2021  Hearing? N N  Vision? N N  Difficulty concentrating or making decisions? N N  Walking or climbing stairs? N N  Dressing or bathing? N N  Doing errands, shopping? N N  Preparing Food and eating ? N -  Using the Toilet? N -  In the past six months, have you accidently leaked urine? N -  Do you have problems with loss of bowel control? N -  Managing your Medications? N -  Managing your Finances? N -  Housekeeping or managing your Housekeeping? N -  Some recent data might be hidden    Patient Care Team: Janora Norlander, DO as PCP - General (Family Medicine) Starling Manns, MD (Orthopedic Surgery)  Indicate any recent Medical Services you may have received from other than [facility]  providers in the past year (date may be approximate).     Assessment:   This is a routine wellness examination for Deng.  Hearing/Vision screen Hearing Screening - Comments:: Denies hearing difficulties  Vision Screening - Comments:: Wears rx glasses - up to date with annual eye exams with [facility]  Dietary issues and exercise activities discussed: Current Exercise Habits: Home exercise routine, Type of exercise: walking;Other - see comments (house work, yard work), Time (Minutes): 60, Frequency (Times/Week): 5, Weekly Exercise (Minutes/Week): 300, Intensity: Moderate, Exercise limited by: orthopedic condition(s)   Goals Addressed             This Visit's Progress    Patient Stated       Stay healthy and active Get pain under control       Depression Screen PHQ 2/9 Scores 12/10/2021 01/03/2021 06/12/2020 01/26/2020 05/23/2019 12/28/2018 06/20/2018  PHQ - 2 Score 0 0 0 0 0 0 0  PHQ- 9 Score - 0 - 0 - - -    Fall Risk Fall Risk  12/10/2021 06/12/2020 01/26/2020 05/23/2019 12/28/2018  Falls in the past year? 0 0 0 0 1  Number falls in past yr: 0 - - - 0  Injury with Fall? 0 - - - 1  Risk for fall due to : Orthopedic patient - - - -  Follow up Falls prevention discussed - - - -    FALL [place]:  Any stairs in or around the home? Yes  If so, are there any without handrails? No  Home free of loose throw rugs in walkways, pet beds, electrical cords, etc? Yes  Adequate lighting in your home to reduce risk of falls? Yes   ASSISTIVE DEVICES UTILIZED TO PREVENT FALLS:  Life alert? No  Use of a cane, walker or w/c? No  Grab bars in the bathroom? No  Shower chair or bench in shower? Yes  Elevated toilet seat or a handicapped toilet? Yes   TIMED UP AND GO:  Was the test performed? No .  Telephonic visit  Cognitive Function:     6CIT Screen 12/10/2021  What Year? 0 points  What month? 0 points  What time? 0 points  Count back from 20 0 points   Months in reverse 0 points  Repeat phrase 0 points  Total Score 0    Immunizations Immunization History  Administered Date(s) Administered   Fluad Quad(high Dose 65+) 09/21/2019   Influenza, High Dose Seasonal PF 10/06/2018, 10/19/2020   Influenza,inj,Quad PF,6+ Mos 11/08/2013, 11/02/2014, 10/16/2015, 10/03/2016, 10/21/2017   Pneumococcal Conjugate-13 12/16/2016   Pneumococcal Polysaccharide-23 06/20/2018   Rabies, IM 05/26/2014, 05/29/2014, 06/04/2014, 06/11/2014   Zoster, Live 06/11/2015    TDAP status: Due, Education has been provided regarding the importance of this vaccine. Advised may receive this vaccine at local pharmacy or Health Dept. Aware to provide a copy of the vaccination record if obtained from local pharmacy or Health Dept. Verbalized acceptance and understanding.  Flu Vaccine status: Up to date  Pneumococcal vaccine status: Up to date  Covid-19 vaccine status: Completed vaccines  Qualifies for Shingles Vaccine? Yes   Zostavax completed Yes   Shingrix Completed?: No.    Education has been provided regarding the importance of this vaccine. Patient has been advised to call insurance company to determine out of pocket expense if they have not yet received this vaccine. Advised may also receive vaccine at local pharmacy or Health Dept. Verbalized acceptance and understanding.  Screening Tests Health Maintenance  Topic Date Due   COVID-19 Vaccine (1) Never done   Zoster Vaccines- Shingrix (1 of 2) Never done   TETANUS/TDAP  04/30/2019   COLON CANCER SCREENING ANNUAL FOBT  01/21/2021   INFLUENZA VACCINE  07/21/2021   COLONOSCOPY (Pts 45-52yrs Insurance coverage will need to be confirmed)  01/21/2025   Pneumonia Vaccine 28+ Years old  Completed   Hepatitis C Screening  Completed   HPV VACCINES  Aged Out    Health Maintenance  Health Maintenance Due  Topic Date Due   COVID-19 Vaccine (1) Never done   Zoster Vaccines- Shingrix (1 of 2) Never done    TETANUS/TDAP  04/30/2019   COLON CANCER SCREENING ANNUAL FOBT  01/21/2021   INFLUENZA VACCINE  07/21/2021    Colorectal cancer screening: Type of screening: Colonoscopy. Completed 01/22/2020. Repeat every 5 years  Lung Cancer Screening: (Low Dose CT Chest recommended if Age 42-80 years, 30 pack-year currently smoking OR have quit w/in 15years.) does not qualify.   Additional Screening:  Hepatitis C Screening: does qualify; Completed 11/09/2017  Vision Screening: Recommended annual ophthalmology exams for early detection of glaucoma and other disorders of the eye. Is the patient up to date with their annual eye exam?  Yes  Who is the provider or what is the name of the office in which the patient attends annual eye exams? [facility] If pt is not established with a provider, would they like to be referred to a provider to establish care? No .   Dental Screening: Recommended annual dental exams for proper oral hygiene  Community Resource Referral / Chronic Care Management: CRR required this visit?  No   CCM required this visit?  No      Plan:     I have personally reviewed and noted the following in the patients chart:   Medical and social history Use of alcohol, tobacco or illicit drugs  Current medications and supplements including opioid prescriptions. Patient is not currently taking opioid prescriptions. Functional ability and status Nutritional status Physical activity Advanced directives List  of other physicians Hospitalizations, surgeries, and ER visits in previous 12 months Vitals Screenings to include cognitive, depression, and falls Referrals and appointments  In addition, I have reviewed and discussed with patient certain preventive protocols, quality metrics, and best practice recommendations. A written personalized care plan for preventive services as well as general preventive health recommendations were provided to patient.     Sandrea Hammond,  LPN   [id number]   Nurse Notes: None

## 2022-01-16 ENCOUNTER — Other Ambulatory Visit: Payer: Self-pay | Admitting: Family Medicine

## 2022-01-16 DIAGNOSIS — I10 Essential (primary) hypertension: Secondary | ICD-10-CM

## 2022-01-19 ENCOUNTER — Encounter: Payer: Medicare Other | Admitting: Family Medicine

## 2022-02-06 ENCOUNTER — Other Ambulatory Visit: Payer: Self-pay

## 2022-02-06 DIAGNOSIS — Z130 Encounter for screening for diseases of the blood and blood-forming organs and certain disorders involving the immune mechanism: Secondary | ICD-10-CM

## 2022-02-06 DIAGNOSIS — Z125 Encounter for screening for malignant neoplasm of prostate: Secondary | ICD-10-CM

## 2022-02-06 DIAGNOSIS — E7800 Pure hypercholesterolemia, unspecified: Secondary | ICD-10-CM

## 2022-03-09 ENCOUNTER — Encounter: Payer: Self-pay | Admitting: Family Medicine

## 2022-03-09 ENCOUNTER — Ambulatory Visit (INDEPENDENT_AMBULATORY_CARE_PROVIDER_SITE_OTHER): Payer: Medicare Other | Admitting: Family Medicine

## 2022-03-09 VITALS — BP 117/65 | HR 59 | Temp 98.2°F | Resp 20 | Ht 72.0 in | Wt 223.0 lb

## 2022-03-09 DIAGNOSIS — L570 Actinic keratosis: Secondary | ICD-10-CM

## 2022-03-09 DIAGNOSIS — M48061 Spinal stenosis, lumbar region without neurogenic claudication: Secondary | ICD-10-CM | POA: Diagnosis not present

## 2022-03-09 DIAGNOSIS — M4802 Spinal stenosis, cervical region: Secondary | ICD-10-CM | POA: Diagnosis not present

## 2022-03-09 DIAGNOSIS — I10 Essential (primary) hypertension: Secondary | ICD-10-CM | POA: Diagnosis not present

## 2022-03-09 DIAGNOSIS — Z0001 Encounter for general adult medical examination with abnormal findings: Secondary | ICD-10-CM

## 2022-03-09 DIAGNOSIS — G609 Hereditary and idiopathic neuropathy, unspecified: Secondary | ICD-10-CM

## 2022-03-09 DIAGNOSIS — R195 Other fecal abnormalities: Secondary | ICD-10-CM

## 2022-03-09 DIAGNOSIS — R351 Nocturia: Secondary | ICD-10-CM

## 2022-03-09 DIAGNOSIS — Z0000 Encounter for general adult medical examination without abnormal findings: Secondary | ICD-10-CM

## 2022-03-09 DIAGNOSIS — E7800 Pure hypercholesterolemia, unspecified: Secondary | ICD-10-CM | POA: Diagnosis not present

## 2022-03-09 DIAGNOSIS — Z125 Encounter for screening for malignant neoplasm of prostate: Secondary | ICD-10-CM

## 2022-03-09 DIAGNOSIS — Z23 Encounter for immunization: Secondary | ICD-10-CM

## 2022-03-09 DIAGNOSIS — Z130 Encounter for screening for diseases of the blood and blood-forming organs and certain disorders involving the immune mechanism: Secondary | ICD-10-CM | POA: Diagnosis not present

## 2022-03-09 LAB — URINALYSIS
Bilirubin, UA: NEGATIVE
Glucose, UA: NEGATIVE
Ketones, UA: NEGATIVE
Leukocytes,UA: NEGATIVE
Nitrite, UA: NEGATIVE
Protein,UA: NEGATIVE
RBC, UA: NEGATIVE
Specific Gravity, UA: 1.01 (ref 1.005–1.030)
Urobilinogen, Ur: 0.2 mg/dL (ref 0.2–1.0)
pH, UA: 6.5 (ref 5.0–7.5)

## 2022-03-09 MED ORDER — AMLODIPINE BESYLATE 5 MG PO TABS: 5.0000 mg | ORAL_TABLET | Freq: Every day | ORAL | 4 refills | Status: DC

## 2022-03-09 MED ORDER — GABAPENTIN 600 MG PO TABS: 600.0000 mg | ORAL_TABLET | Freq: Three times a day (TID) | ORAL | 3 refills | Status: DC

## 2022-03-09 NOTE — Progress Notes (Signed)
Marcus Jensen is a 71 y.o. male presents to office today for annual physical exam examination.    Concerns today include: 1.  Nocturia Patient reports that he has been experiencing nocturia for quite some time now.  He describes urinating up to 4-5 times per night or roughly every 3- hours.  No hematuria, dysuria, unplanned weight loss or night sweats reported.  He was previously referred to Dr. Annabell Howells a few years ago but then COVID-19 happened and he was lost to establishing care.  He would like to go ahead and get that referral renewed today.  Occupation: International aid/development worker, semiretired, Marital status: Married, Substance use: None Diet: Balanced, Exercise: Stays physically active Last colonoscopy: Up-to-date Refills needed today: Norvasc and Neurontin Immunizations needed: Immunization History  Administered Date(s) Administered   Fluad Quad(high Dose 65+) 09/21/2019   Influenza, High Dose Seasonal PF 10/06/2018, 10/19/2020   Influenza,inj,Quad PF,6+ Mos 11/08/2013, 11/02/2014, 10/16/2015, 10/03/2016, 10/21/2017   Pneumococcal Conjugate-13 12/16/2016   Pneumococcal Polysaccharide-23 06/20/2018   Rabies, IM 05/26/2014, 05/29/2014, 06/04/2014, 06/11/2014   Zoster, Live 06/11/2015     Past Medical History:  Diagnosis Date   Allergy    environmental   Arthritis    Cervical disc disease   GERD (gastroesophageal reflux disease)    Hyperlipidemia    Hypertension    Peripheral neuropathy    PONV (postoperative nausea and vomiting)    Social History   Socioeconomic History   Marital status: Married    Spouse name: Not on file   Number of children: 2   Years of education: Not on file   Highest education level: Not on file  Occupational History   Occupation: Vet    Comment: retired/part time  Tobacco Use   Smoking status: Former    Packs/day: 1.00    Years: 6.00    Pack years: 6.00    Types: Cigarettes    Quit date: 02/01/1990    Years since quitting: 32.1   Smokeless  tobacco: Former    Types: Chew    Quit date: 08/20/1987  Vaping Use   Vaping Use: Never used  Substance and Sexual Activity   Alcohol use: Yes    Alcohol/week: 4.0 standard drinks    Types: 2 Glasses of wine, 2 Cans of beer per week   Drug use: No   Sexual activity: Yes    Birth control/protection: None  Other Topics Concern   Not on file  Social History Narrative   Lives with wife   Left Handed   Drinks 4-5 cups caffeine daily   Social Determinants of Health   Financial Resource Strain: Low Risk    Difficulty of Paying Living Expenses: Not hard at all  Food Insecurity: No Food Insecurity   Worried About Programme researcher, broadcasting/film/video in the Last Year: Never true   Barista in the Last Year: Never true  Transportation Needs: No Transportation Needs   Lack of Transportation (Medical): No   Lack of Transportation (Non-Medical): No  Physical Activity: Sufficiently Active   Days of Exercise per Week: 5 days   Minutes of Exercise per Session: 60 min  Stress: No Stress Concern Present   Feeling of Stress : Not at all  Social Connections: Socially Integrated   Frequency of Communication with Friends and Family: More than three times a week   Frequency of Social Gatherings with Friends and Family: More than three times a week   Attends Religious Services: More than 4 times per year  Active Member of Clubs or Organizations: Yes   Attends Banker Meetings: 1 to 4 times per year   Marital Status: Married  Catering manager Violence: Not At Risk   Fear of Current or Ex-Partner: No   Emotionally Abused: No   Physically Abused: No   Sexually Abused: No   Past Surgical History:  Procedure Laterality Date   ANTERIOR CERVICAL DECOMP/DISCECTOMY FUSION N/A 09/17/2021   Procedure: CERVICAL FOUR-FIVE, CERVICAL FIVE-SIX ANTERIOR CERVICAL DECOMPRESSION/DISCECTOMY FUSION;  Surgeon: Tia Alert, MD;  Location: [facility];  Service: Neurosurgery;  Laterality: N/A;  3C   carpal tunnel  Bilateral 05/2019   Dr Butler Denmark ([facility])   COLONOSCOPY     HAND SURGERY Left    fractured metacarpal 5th digit   HERNIA REPAIR     INGUINAL HERNIA REPAIR Right 08/21/2016   Procedure: RIGHT INGUINAL HERNIORRHAPHY WITH MESH;  Surgeon: Franky Macho, MD;  Location: [facility];  Service: General;  Laterality: Right;   KNEE SURGERY Bilateral    arthroscopy   SKIN BIOPSY     left side nose   Family History  Problem Relation Age of Onset   CVA Mother 82   CVA Father    Cancer Brother        liver   Colon cancer Neg Hx    Colon polyps Neg Hx    Esophageal cancer Neg Hx    Rectal cancer Neg Hx    Stomach cancer Neg Hx     Current Outpatient Medications:    acetaminophen (TYLENOL) 500 MG tablet, Take 1,000 mg by mouth every 6 (six) hours as needed., Disp: , Rfl:    amLODipine (NORVASC) 5 MG tablet, TAKE 1 TABLET BY MOUTH  DAILY, Disp: 90 tablet, Rfl: 0   Cholecalciferol (VITAMIN D) 50 MCG (2000 UT) tablet, Take 2,000 Units by mouth in the morning and at bedtime., Disp: , Rfl:    famotidine (PEPCID) 10 MG tablet, Take 10 mg by mouth daily as needed for heartburn., Disp: , Rfl:    gabapentin (NEURONTIN) 600 MG tablet, Take 600 mg by mouth 3 (three) times daily., Disp: , Rfl:    glucosamine-chondroitin 500-400 MG tablet, Take 2 tablets by mouth 2 (two) times daily. , Disp: , Rfl:    methocarbamol (ROBAXIN) 500 MG tablet, Take 1 tablet (500 mg total) by mouth every 6 (six) hours as needed for muscle spasms., Disp: 60 tablet, Rfl: 1   metoprolol succinate (TOPROL-XL) 25 MG 24 hr tablet, TAKE 1 TABLET BY MOUTH IN  THE MORNING AND AT BEDTIME, Disp: 180 tablet, Rfl: 3   oxyCODONE (OXY IR/ROXICODONE) 5 MG immediate release tablet, Take 1 tablet (5 mg total) by mouth every 4 (four) hours as needed for moderate pain ((score 4 to 6))., Disp: 30 tablet, Rfl: 0   oxyCODONE-acetaminophen (PERCOCET/ROXICET) 5-325 MG tablet, Take by mouth., Disp: , Rfl:    rosuvastatin (CRESTOR) 10 MG tablet,  Take 10 mg by mouth daily., Disp: , Rfl:    sildenafil (VIAGRA) 100 MG tablet, Take 0.5-1 tablets (50-100 mg total) by mouth daily as needed for erectile dysfunction., Disp: 5 tablet, Rfl: 11  No Known Allergies   ROS: Review of Systems A comprehensive review of systems was negative except for: Eyes: positive for contacts/glasses Gastrointestinal: positive for dark stool Genitourinary: positive for nocturia Integument/breast: positive for skin lesion(s) Allergic/Immunologic: positive for mild nasal drainage/ irritation    Physical exam BP 117/65   Pulse (!) 59   Temp 98.2 F (  36.8 C)   Resp 20   Ht 6' (1.829 m)   Wt 223 lb (101.2 kg)   SpO2 96%   BMI 30.24 kg/m  General appearance: alert, cooperative, appears stated age, and no distress Head: Normocephalic, without obvious abnormality, atraumatic Eyes: negative findings: lids and lashes normal, conjunctivae and sclerae normal, corneas clear, and pupils equal, round, reactive to light and accomodation Ears: normal TM's and external ear canals both ears Nose: Nares normal. Septum midline. Mucosa normal. No drainage or sinus tenderness. Throat:  Very mild cobblestone appearance of the oropharynx.  Mallampati 1.  No oropharyngeal masses appreciated Neck: no adenopathy, no carotid bruit, supple, symmetrical, trachea midline, and thyroid not enlarged, symmetric, no tenderness/mass/nodules Back: symmetric, no curvature. ROM normal. No CVA tenderness. Lungs: clear to auscultation bilaterally Chest wall: no tenderness Heart: regular rate and rhythm, S1, S2 normal, no murmur, click, rub or gallop Abdomen: soft, non-tender; bowel sounds normal; no masses,  no organomegaly Extremities: extremities normal, atraumatic, no cyanosis or edema Pulses: 2+ and symmetric Skin:  Few pigmented skin lesions noted along the upper extremities.  He has a couple of lesions on the dorsum of the right hand that appear to be actinic keratosis  Lymph nodes:  Cervical, supraclavicular, and axillary nodes normal. Neurologic: Grossly normal Flowsheet Row Office Visit from 03/09/2022 in [facility]  PHQ-2 Total Score 0       Assessment/ Plan: Marni Griffon here for annual physical exam.   Annual physical exam  Pure hypercholesterolemia - Plan: Lipid panel, CMP14+EGFR  Essential hypertension - Plan: amLODipine (NORVASC) 5 MG tablet  Spinal stenosis of cervical region - Plan: gabapentin (NEURONTIN) 600 MG tablet  Degenerative lumbar spinal stenosis - Plan: gabapentin (NEURONTIN) 600 MG tablet  Idiopathic peripheral neuropathy - Plan: gabapentin (NEURONTIN) 600 MG tablet  Dark stools - Plan: Fecal occult blood, imunochemical  Screening for deficiency anemia - Plan: CBC with Differential/Platelet  Nocturia - Plan: Urinalysis, Ambulatory referral to Urology  Screening PSA (prostate specific antigen) - Plan: PSA  Need for tetanus booster - Plan: Tdap vaccine greater than or equal to 7yo IM  Actinic keratoses  Tetanus shot administered today.  Needs FOBT for annual colon cancer screening.  Had reported some darker stools than normal today as well over the last couple weeks so rule out GI bleeding  Blood pressure under excellent control.  No changes.  Renewals have been sent on his medication  Continues to have right-sided radicular symptoms status post surgical intervention for C-spine.  Gabapentin renewed.  Continue twice daily to 3 times daily use  Referral to urology for nocturia that is likely reflective of BPH.  PSA ordered.  Urinalysis without evidence of infection or blood  Suspect actinic keratosis on the dorsum of the right hand.  He will have this treated with cryo ablation  Patient to follow up in 1 year for annual exam or sooner if needed.  Mavryk Pino M. Nadine Counts, DO

## 2022-03-09 NOTE — Patient Instructions (Signed)
Preventive Care 65 Years and Older, Male °Preventive care refers to lifestyle choices and visits with your health care provider that can promote health and wellness. Preventive care visits are also called wellness exams. °What can I expect for my preventive care visit? °Counseling °During your preventive care visit, your health care provider may ask about your: °Medical history, including: °Past medical problems. °Family medical history. °History of falls. °Current health, including: °Emotional well-being. °Home life and relationship well-being. °Sexual activity. °Memory and ability to understand (cognition). °Lifestyle, including: °Alcohol, nicotine or tobacco, and drug use. °Access to firearms. °Diet, exercise, and sleep habits. °Work and work environment. °Sunscreen use. °Safety issues such as seatbelt and bike helmet use. °Physical exam °Your health care provider will check your: °Height and weight. These may be used to calculate your BMI (body mass index). BMI is a measurement that tells if you are at a healthy weight. °Waist circumference. This measures the distance around your waistline. This measurement also tells if you are at a healthy weight and may help predict your risk of certain diseases, such as type 2 diabetes and high blood pressure. °Heart rate and blood pressure. °Body temperature. °Skin for abnormal spots. °What immunizations do I need? °Vaccines are usually given at various ages, according to a schedule. Your health care provider will recommend vaccines for you based on your age, medical history, and lifestyle or other factors, such as travel or where you work. °What tests do I need? °Screening °Your health care provider may recommend screening tests for certain conditions. This may include: °Lipid and cholesterol levels. °Diabetes screening. This is done by checking your blood sugar (glucose) after you have not eaten for a while (fasting). °Hepatitis C test. °Hepatitis B test. °HIV (human  immunodeficiency virus) test. °STI (sexually transmitted infection) testing, if you are at risk. °Lung cancer screening. °Colorectal cancer screening. °Prostate cancer screening. °Abdominal aortic aneurysm (AAA) screening. You may need this if you are a current or former smoker. °Talk with your health care provider about your test results, treatment options, and if necessary, the need for more tests. °Follow these instructions at home: °Eating and drinking ° °Eat a diet that includes fresh fruits and vegetables, whole grains, lean protein, and low-fat dairy products. Limit your intake of foods with high amounts of sugar, saturated fats, and salt. °Take vitamin and mineral supplements as recommended by your health care provider. °Do not drink alcohol if your health care provider tells you not to drink. °If you drink alcohol: °Limit how much you have to 0-2 drinks a day. °Know how much alcohol is in your drink. In the [place] one drink equals one 12 oz bottle of beer (355 mL), one 5 oz glass of wine (148 mL), or one 1½ oz glass of hard liquor (44 mL). °Lifestyle °Brush your teeth every morning and night with fluoride toothpaste. Floss one time each day. °Exercise for at least 30 minutes 5 or more days each week. °Do not use any products that contain nicotine or tobacco. These products include cigarettes, chewing tobacco, and vaping devices, such as e-cigarettes. If you need help quitting, ask your health care provider. °Do not use drugs. °If you are sexually active, practice safe sex. Use a condom or other form of protection to prevent STIs. °Take aspirin only as told by your health care provider. Make sure that you understand how much to take and what form to take. Work with your health care provider to find out whether it is safe and   beneficial for you to take aspirin daily. °Ask your health care provider if you need to take a cholesterol-lowering medicine (statin). °Find healthy ways to manage stress, such  as: °Meditation, yoga, or listening to music. °Journaling. °Talking to a trusted person. °Spending time with friends and family. °Safety °Always wear your seat belt while driving or riding in a vehicle. °Do not drive: °If you have been drinking alcohol. Do not ride with someone who has been drinking. °When you are tired or distracted. °While texting. °If you have been using any mind-altering substances or drugs. °Wear a helmet and other protective equipment during sports activities. °If you have firearms in your house, make sure you follow all gun safety procedures. °Minimize exposure to UV radiation to reduce your risk of skin cancer. °What's next? °Visit your health care provider once a year for an annual wellness visit. °Ask your health care provider how often you should have your eyes and teeth checked. °Stay up to date on all vaccines. °This information is not intended to replace advice given to you by your health care provider. Make sure you discuss any questions you have with your health care provider. °Document Revised: 06/04/2021 Document Reviewed: 06/04/2021 °Elsevier Patient Education © 2022 [employer] ° °

## 2022-03-10 ENCOUNTER — Other Ambulatory Visit: Payer: Self-pay | Admitting: Family Medicine

## 2022-03-10 LAB — CBC WITH DIFFERENTIAL/PLATELET
Basophils Absolute: 0 10*3/uL (ref 0.0–0.2)
Basos: 1 %
EOS (ABSOLUTE): 0.1 10*3/uL (ref 0.0–0.4)
Eos: 2 %
Hematocrit: 42.4 % (ref 37.5–51.0)
Hemoglobin: 14.3 g/dL (ref 13.0–17.7)
Immature Grans (Abs): 0 10*3/uL (ref 0.0–0.1)
Immature Granulocytes: 0 %
Lymphocytes Absolute: 0.9 10*3/uL (ref 0.7–3.1)
Lymphs: 18 %
MCH: 30.3 pg (ref 26.6–33.0)
MCHC: 33.7 g/dL (ref 31.5–35.7)
MCV: 90 fL (ref 79–97)
Monocytes Absolute: 0.4 10*3/uL (ref 0.1–0.9)
Monocytes: 8 %
Neutrophils Absolute: 3.5 10*3/uL (ref 1.4–7.0)
Neutrophils: 71 %
Platelets: 165 10*3/uL (ref 150–450)
RBC: 4.72 x10E6/uL (ref 4.14–5.80)
RDW: 11.8 % (ref 11.6–15.4)
WBC: 5 10*3/uL (ref 3.4–10.8)

## 2022-03-10 LAB — CMP14+EGFR
ALT: 24 IU/L (ref 0–44)
AST: 32 IU/L (ref 0–40)
Albumin/Globulin Ratio: 2.1 (ref 1.2–2.2)
Albumin: 4.7 g/dL (ref 3.8–4.8)
Alkaline Phosphatase: 93 IU/L (ref 44–121)
BUN/Creatinine Ratio: 15 (ref 10–24)
BUN: 18 mg/dL (ref 8–27)
Bilirubin Total: 0.9 mg/dL (ref 0.0–1.2)
CO2: 25 mmol/L (ref 20–29)
Calcium: 9.5 mg/dL (ref 8.6–10.2)
Chloride: 97 mmol/L (ref 96–106)
Creatinine, Ser: 1.22 mg/dL (ref 0.76–1.27)
Globulin, Total: 2.2 g/dL (ref 1.5–4.5)
Glucose: 92 mg/dL (ref 70–99)
Potassium: 4.1 mmol/L (ref 3.5–5.2)
Sodium: 138 mmol/L (ref 134–144)
Total Protein: 6.9 g/dL (ref 6.0–8.5)
eGFR: 65 mL/min/{1.73_m2} (ref >59)

## 2022-03-10 LAB — LIPID PANEL
Chol/HDL Ratio: 2.5 ratio (ref 0.0–5.0)
Cholesterol, Total: 142 mg/dL (ref 100–199)
HDL: 57 mg/dL (ref >39)
LDL Chol Calc (NIH): 73 mg/dL (ref 0–99)
Triglycerides: 55 mg/dL (ref 0–149)
VLDL Cholesterol Cal: 12 mg/dL (ref 5–40)

## 2022-03-10 LAB — PSA: Prostate Specific Ag, Serum: 2.7 ng/mL (ref 0.0–4.0)

## 2022-04-23 ENCOUNTER — Ambulatory Visit: Payer: Medicare Other | Admitting: Urology

## 2022-04-23 ENCOUNTER — Encounter: Payer: Self-pay | Admitting: Urology

## 2022-04-23 VITALS — BP 135/77 | HR 56 | Ht 73.0 in | Wt 217.0 lb

## 2022-04-23 DIAGNOSIS — N5201 Erectile dysfunction due to arterial insufficiency: Secondary | ICD-10-CM

## 2022-04-23 DIAGNOSIS — N2882 Megaloureter: Secondary | ICD-10-CM

## 2022-04-23 DIAGNOSIS — R351 Nocturia: Secondary | ICD-10-CM

## 2022-04-23 LAB — URINALYSIS, ROUTINE W REFLEX MICROSCOPIC
Bilirubin, UA: NEGATIVE
Glucose, UA: NEGATIVE
Ketones, UA: NEGATIVE
Leukocytes,UA: NEGATIVE
Nitrite, UA: NEGATIVE
Protein,UA: NEGATIVE
RBC, UA: NEGATIVE
Specific Gravity, UA: 1.025 (ref 1.005–1.030)
Urobilinogen, Ur: 0.2 mg/dL (ref 0.2–1.0)
pH, UA: 6.5 (ref 5.0–7.5)

## 2022-04-23 NOTE — Progress Notes (Signed)
?Subjective: ?1. Nocturia   ?2. Ureteric dilatation, right   ?3. Erectile dysfunction due to arterial insufficiency   ?  ? ?Consult requested by Dr. Adam Phenix. ? ?Marcus Jensen is a 71 yo male who is sent for the complaint of nocturia 2-3x nightly for the last 2-3 years.  He has a reduced stream at times but feels he empties.   He has had no hematuria or dysuria.   He has no history of UTI's or stones.  He also had right proximal ureteral dilation on a renal US in 2019.  He has some issues with ED and is on sildenafil.  He has had no GU surgery.   His last PSA was 2.8 which was stable.  His UA is clear.   ?ROS: ? ?Review of Systems  ?Musculoskeletal:  Positive for back pain and joint pain.  ?All other systems reviewed and are negative. ? ?No Known Allergies ? ?Past Medical History:  ?Diagnosis Date  ? Allergy   ? environmental  ? Arthritis   ? Cervical disc disease  ? GERD (gastroesophageal reflux disease)   ? Hyperlipidemia   ? Hypertension   ? Peripheral neuropathy   ? PONV (postoperative nausea and vomiting)   ? ? ?Past Surgical History:  ?Procedure Laterality Date  ? ANTERIOR CERVICAL DECOMP/DISCECTOMY FUSION N/A 09/17/2021  ? Procedure: CERVICAL FOUR-FIVE, CERVICAL FIVE-SIX ANTERIOR CERVICAL DECOMPRESSION/DISCECTOMY FUSION;  Surgeon: [name], MD;  Location: [facility];  Service: Neurosurgery;  Laterality: N/A;  3C  ? carpal tunnel Bilateral 05/2019  ? Dr Veronia Beets [facility])  ? COLONOSCOPY    ? HAND SURGERY Left   ? fractured metacarpal 5th digit  ? HERNIA REPAIR    ? INGUINAL HERNIA REPAIR Right 08/21/2016  ? Procedure: RIGHT INGUINAL HERNIORRHAPHY WITH MESH;  Surgeon: Aviva Signs, MD;  Location: [facility];  Service: General;  Laterality: Right;  ? KNEE SURGERY Bilateral   ? arthroscopy  ? SKIN BIOPSY    ? left side nose  ? ? ?Social History  ? ?Socioeconomic History  ? Marital status: Married  ?  Spouse name: Not on file  ? Number of children: 2  ? Years of education: Not on file  ? Highest  education level: Not on file  ?Occupational History  ? Occupation: Vet  ?  Comment: retired/part time  ?Tobacco Use  ? Smoking status: Former  ?  Packs/day: 1.00  ?  Years: 6.00  ?  Pack years: 6.00  ?  Types: Cigarettes  ?  Quit date: 02/01/1990  ?  Years since quitting: 32.2  ? Smokeless tobacco: Former  ?  Types: Chew  ?  Quit date: 08/20/1987  ?Vaping Use  ? Vaping Use: Never used  ?Substance and Sexual Activity  ? Alcohol use: Yes  ?  Alcohol/week: 4.0 standard drinks  ?  Types: 2 Glasses of wine, 2 Cans of beer per week  ? Drug use: No  ? Sexual activity: Yes  ?  Birth control/protection: None  ?Other Topics Concern  ? Not on file  ?Social History Narrative  ? Lives with wife  ? Left Handed  ? Drinks 4-5 cups caffeine daily  ? ?Social Determinants of Health  ? ?Financial Resource Strain: Low Risk   ? Difficulty of Paying Living Expenses: Not hard at all  ?Food Insecurity: No Food Insecurity  ? Worried About Charity fundraiser in the Last Year: Never true  ? Ran Out of Food in the Last Year: Never true  ?  Transportation Needs: No Transportation Needs  ? Lack of Transportation (Medical): No  ? Lack of Transportation (Non-Medical): No  ?Physical Activity: Sufficiently Active  ? Days of Exercise per Week: 5 days  ? Minutes of Exercise per Session: 60 min  ?Stress: No Stress Concern Present  ? Feeling of Stress : Not at all  ?Social Connections: Socially Integrated  ? Frequency of Communication with Friends and Family: More than three times a week  ? Frequency of Social Gatherings with Friends and Family: More than three times a week  ? Attends Religious Services: More than 4 times per year  ? Active Member of Clubs or Organizations: Yes  ? Attends Archivist Meetings: 1 to 4 times per year  ? Marital Status: Married  ?Intimate Partner Violence: Not At Risk  ? Fear of Current or Ex-Partner: No  ? Emotionally Abused: No  ? Physically Abused: No  ? Sexually Abused: No  ? ? ?Family History  ?Problem Relation  Age of Onset  ? CVA Mother 25  ? CVA Father   ? Cancer Brother   ?     liver  ? Colon cancer Neg Hx   ? Colon polyps Neg Hx   ? Esophageal cancer Neg Hx   ? Rectal cancer Neg Hx   ? Stomach cancer Neg Hx   ? ? ?Anti-infectives: ?Anti-infectives (From admission, onward)  ? ? None  ? ?  ? ? ?Current Outpatient Medications  ?Medication Sig Dispense Refill  ? amLODipine (NORVASC) 5 MG tablet Take 1 tablet (5 mg total) by mouth daily. 90 tablet 4  ? Cholecalciferol (VITAMIN D) 50 MCG (2000 UT) tablet Take 2,000 Units by mouth in the morning and at bedtime.    ? famotidine (PEPCID) 10 MG tablet Take 10 mg by mouth daily as needed for heartburn.    ? gabapentin (NEURONTIN) 600 MG tablet Take 1 tablet (600 mg total) by mouth 3 (three) times daily. 270 tablet 3  ? metoprolol succinate (TOPROL-XL) 25 MG 24 hr tablet TAKE 1 TABLET BY MOUTH IN  THE MORNING AND AT BEDTIME (Patient taking differently: daily.) 180 tablet 3  ? rosuvastatin (CRESTOR) 20 MG tablet TAKE 1 TABLET BY MOUTH  DAILY 90 tablet 3  ? sildenafil (VIAGRA) 100 MG tablet Take 0.5-1 tablets (50-100 mg total) by mouth daily as needed for erectile dysfunction. 5 tablet 11  ? acetaminophen (TYLENOL) 500 MG tablet Take 1,000 mg by mouth every 6 (six) hours as needed.    ? glucosamine-chondroitin 500-400 MG tablet Take 2 tablets by mouth 2 (two) times daily.  (Patient not taking: Reported on 03/09/2022)    ? Naproxen Sodium 220 MG CAPS     ? ?No current facility-administered medications for this visit.  ? ? ? ?Objective: ?Vital signs in last 24 hours: ?BP 135/77   Pulse (!) 56   Ht '6\' 1"'$  (1.854 m)   Wt 217 lb (98.4 kg)   BMI 28.63 kg/m?  ? ?Intake/Output from previous day: ?No intake/output data recorded. ?Intake/Output this shift: ?'@IOTHISSHIFT'$ @ ? ? ?Physical Exam ?Vitals reviewed.  ?Constitutional:   ?   Appearance: Normal appearance.  ?Cardiovascular:  ?   Rate and Rhythm: Normal rate and regular rhythm.  ?   Heart sounds: Normal heart sounds.  ?Pulmonary:  ?    Effort: Pulmonary effort is normal. No respiratory distress.  ?   Breath sounds: Normal breath sounds.  ?Abdominal:  ?   General: Abdomen is flat.  ?  Palpations: Abdomen is soft.  ?   Hernia: No hernia is present.  ?Genitourinary: ?   Comments: Normal circ phallus with adequate meatus. ?Scrotum has a 5cm soft right hydrocele and a 2cm left upper spermatocele. ?Testes are normal. ?Epididymis are normal. ?AP without lesions. ?NST without mass. ?Prostate 2+ benign. ?SV non-palpable.  ?Musculoskeletal:     ?   General: Normal range of motion.  ?Skin: ?   General: Skin is warm and dry.  ?Neurological:  ?   General: No focal deficit present.  ?   Mental Status: He is alert and oriented to person, place, and time.  ? ? ?Lab Results:  ?Results for orders placed or performed in visit on 04/23/22 (from the past 24 hour(s))  ?Urinalysis, Routine w reflex microscopic     Status: None  ? Collection Time: 04/23/22  9:31 AM  ?Result Value Ref Range  ? Specific Gravity, UA 1.025 1.005 - 1.030  ? pH, UA 6.5 5.0 - 7.5  ? Color, UA Yellow Yellow  ? Appearance Ur Clear Clear  ? Leukocytes,UA Negative Negative  ? Protein,UA Negative Negative/Trace  ? Glucose, UA Negative Negative  ? Ketones, UA Negative Negative  ? RBC, UA Negative Negative  ? Bilirubin, UA Negative Negative  ? Urobilinogen, Ur 0.2 0.2 - 1.0 mg/dL  ? Nitrite, UA Negative Negative  ? Microscopic Examination Comment   ? Narrative  ? Performed at:  [facility] ?[address], [place], [place]  [id number] ?Lab Director: Orestes, Phone:  [phone]  ?  ?BMET ?No results for input(s): NA, K, CL, CO2, GLUCOSE, BUN, CREATININE, CALCIUM in the last 72 hours. ?PT/INR ?No results for input(s): LABPROT, INR in the last 72 hours. ?ABG ?No results for input(s): PHART, HCO3 in the last 72 hours. ? ?Invalid input(s): PCO2, PO2 ? ?Studies/Results: ?Renal US and prior Abd MRI reviewed.    ? ? ?Assessment/Plan: ?He has nocturia as an isolated symptom.  I have  recommended he cut back on his heavy coffee consumption to see if that helps and I will reassess in 3 months. ? ?2.   Right ureteral dilation.  The [place] was 4 years ago and he has had no concerning symptoms.  I

## 2022-04-30 DIAGNOSIS — M542 Cervicalgia: Secondary | ICD-10-CM | POA: Diagnosis not present

## 2022-05-06 DIAGNOSIS — R0981 Nasal congestion: Secondary | ICD-10-CM | POA: Diagnosis not present

## 2022-05-06 DIAGNOSIS — R051 Acute cough: Secondary | ICD-10-CM | POA: Diagnosis not present

## 2022-05-06 DIAGNOSIS — J9801 Acute bronchospasm: Secondary | ICD-10-CM | POA: Diagnosis not present

## 2022-05-06 DIAGNOSIS — J029 Acute pharyngitis, unspecified: Secondary | ICD-10-CM | POA: Diagnosis not present

## 2022-05-06 DIAGNOSIS — Z20822 Contact with and (suspected) exposure to covid-19: Secondary | ICD-10-CM | POA: Diagnosis not present

## 2022-05-08 ENCOUNTER — Encounter: Payer: Self-pay | Admitting: Family Medicine

## 2022-05-08 ENCOUNTER — Ambulatory Visit (INDEPENDENT_AMBULATORY_CARE_PROVIDER_SITE_OTHER): Payer: Medicare Other | Admitting: Family Medicine

## 2022-05-08 ENCOUNTER — Ambulatory Visit (INDEPENDENT_AMBULATORY_CARE_PROVIDER_SITE_OTHER): Payer: Medicare Other

## 2022-05-08 VITALS — BP 120/67 | HR 68 | Temp 98.9°F | Ht 73.0 in | Wt 219.0 lb

## 2022-05-08 DIAGNOSIS — R059 Cough, unspecified: Secondary | ICD-10-CM | POA: Diagnosis not present

## 2022-05-08 DIAGNOSIS — J069 Acute upper respiratory infection, unspecified: Secondary | ICD-10-CM | POA: Diagnosis not present

## 2022-05-08 MED ORDER — GUAIFENESIN ER 600 MG PO TB12: 600.0000 mg | ORAL_TABLET | Freq: Two times a day (BID) | ORAL | 0 refills | Status: AC

## 2022-05-08 MED ORDER — FLUTICASONE PROPIONATE 50 MCG/ACT NA SUSP: 2.0000 | Freq: Every day | NASAL | 6 refills | Status: DC

## 2022-05-08 MED ORDER — MONTELUKAST SODIUM 10 MG PO TABS: 10.0000 mg | ORAL_TABLET | Freq: Every day | ORAL | 3 refills | Status: DC

## 2022-05-08 MED ORDER — AMOXICILLIN-POT CLAVULANATE 875-125 MG PO TABS: 1.0000 | ORAL_TABLET | Freq: Two times a day (BID) | ORAL | 0 refills | Status: AC

## 2022-05-08 NOTE — Progress Notes (Signed)
Subjective:  Patient ID: Marcus Jensen, male    DOB: 1951-03-29, 70 y.o.   MRN: [id number]  Patient Care Team: Janora Norlander, DO as PCP - General (Family Medicine) Starling Manns, MD (Orthopedic Surgery)   Chief Complaint:  Cough   HPI: Marcus Jensen is a 71 y.o. male presenting on 05/08/2022 for Cough   Pt presents today with complaints of ongoing cough and tickle in the back of throat. States he gets the tickle in his throat and is unable to control the urge to cough. States once he starts coughing, it will last for several seconds. Unable to lay flat due to worsening symptoms. No shortness of breath, leg swelling, or fatigue. No chest pain. He was prescribed prednisone and tessalon at UC, minimal to no relief of symptoms. Does report intermittent fever and chills. Was taking allegra but stopped due to xerostomia.   Cough The problem has been waxing and waning. The problem occurs every few minutes. Associated symptoms include chills, a fever and postnasal drip. Pertinent negatives include no chest pain, headaches, shortness of breath or wheezing.    Relevant past medical, surgical, family, and social history reviewed and updated as indicated.  Allergies and medications reviewed and updated. Data reviewed: Chart in Epic.   Past Medical History:  Diagnosis Date   Allergy    environmental   Arthritis    Cervical disc disease   GERD (gastroesophageal reflux disease)    Hyperlipidemia    Hypertension    Peripheral neuropathy    PONV (postoperative nausea and vomiting)     Past Surgical History:  Procedure Laterality Date   ANTERIOR CERVICAL DECOMP/DISCECTOMY FUSION N/A 09/17/2021   Procedure: CERVICAL FOUR-FIVE, CERVICAL FIVE-SIX ANTERIOR CERVICAL DECOMPRESSION/DISCECTOMY FUSION;  Surgeon: [name], MD;  Location: [facility];  Service: Neurosurgery;  Laterality: N/A;  3C   carpal tunnel Bilateral 05/2019   Dr Veronia Beets ([facility])   COLONOSCOPY      HAND SURGERY Left    fractured metacarpal 5th digit   HERNIA REPAIR     INGUINAL HERNIA REPAIR Right 08/21/2016   Procedure: RIGHT INGUINAL HERNIORRHAPHY WITH MESH;  Surgeon: Aviva Signs, MD;  Location: [facility];  Service: General;  Laterality: Right;   KNEE SURGERY Bilateral    arthroscopy   SKIN BIOPSY     left side nose    Social History   Socioeconomic History   Marital status: Married    Spouse name: Not on file   Number of children: 2   Years of education: Not on file   Highest education level: Not on file  Occupational History   Occupation: Vet    Comment: retired/part time  Tobacco Use   Smoking status: Former    Packs/day: 1.00    Years: 6.00    Pack years: 6.00    Types: Cigarettes    Quit date: 02/01/1990    Years since quitting: 32.2   Smokeless tobacco: Former    Types: Chew    Quit date: 08/20/1987  Vaping Use   Vaping Use: Never used  Substance and Sexual Activity   Alcohol use: Yes    Alcohol/week: 4.0 standard drinks    Types: 2 Glasses of wine, 2 Cans of beer per week   Drug use: No   Sexual activity: Yes    Birth control/protection: None  Other Topics Concern   Not on file  Social History Narrative   Lives with wife   Left Handed  Drinks 4-5 cups caffeine daily   Social Determinants of Health   Financial Resource Strain: Low Risk    Difficulty of Paying Living Expenses: Not hard at all  Food Insecurity: No Food Insecurity   Worried About Charity fundraiser in the Last Year: Never true   Arboriculturist in the Last Year: Never true  Transportation Needs: No Transportation Needs   Lack of Transportation (Medical): No   Lack of Transportation (Non-Medical): No  Physical Activity: Sufficiently Active   Days of Exercise per Week: 5 days   Minutes of Exercise per Session: 60 min  Stress: No Stress Concern Present   Feeling of Stress : Not at all  Social Connections: Socially Integrated   Frequency of Communication with Friends and Family:  More than three times a week   Frequency of Social Gatherings with Friends and Family: More than three times a week   Attends Religious Services: More than 4 times per year   Active Member of [employer] or Organizations: Yes   Attends Archivist Meetings: 1 to 4 times per year   Marital Status: Married  Human resources officer Violence: Not At Risk   Fear of Current or Ex-Partner: No   Emotionally Abused: No   Physically Abused: No   Sexually Abused: No    Outpatient Encounter Medications as of 05/08/2022  Medication Sig   amLODipine (NORVASC) 5 MG tablet Take 1 tablet (5 mg total) by mouth daily.   amoxicillin-clavulanate (AUGMENTIN) 875-125 MG tablet Take 1 tablet by mouth 2 (two) times daily for 10 days.   benzonatate (TESSALON) 100 MG capsule Take by mouth.   Cholecalciferol (VITAMIN D) 50 MCG (2000 UT) tablet Take 2,000 Units by mouth in the morning and at bedtime.   famotidine (PEPCID) 10 MG tablet Take 10 mg by mouth daily as needed for heartburn.   fluticasone (FLONASE) 50 MCG/ACT nasal spray Place 2 sprays into both nostrils daily.   gabapentin (NEURONTIN) 600 MG tablet Take 1 tablet (600 mg total) by mouth 3 (three) times daily.   glucosamine-chondroitin 500-400 MG tablet Take 2 tablets by mouth 2 (two) times daily.   guaiFENesin (MUCINEX) 600 MG 12 hr tablet Take 1 tablet (600 mg total) by mouth 2 (two) times daily for 10 days.   metoprolol succinate (TOPROL-XL) 25 MG 24 hr tablet TAKE 1 TABLET BY MOUTH IN  THE MORNING AND AT BEDTIME (Patient taking differently: daily.)   montelukast (SINGULAIR) 10 MG tablet Take 1 tablet (10 mg total) by mouth at bedtime.   Naproxen Sodium 220 MG CAPS    predniSONE (DELTASONE) 20 MG tablet Take by mouth.   rosuvastatin (CRESTOR) 20 MG tablet TAKE 1 TABLET BY MOUTH  DAILY   sildenafil (VIAGRA) 100 MG tablet Take 0.5-1 tablets (50-100 mg total) by mouth daily as needed for erectile dysfunction.   acetaminophen (TYLENOL) 500 MG tablet Take 1,000  mg by mouth every 6 (six) hours as needed.   No facility-administered encounter medications on file as of 05/08/2022.    No Known Allergies  Review of Systems  Constitutional:  Positive for chills and fever. Negative for activity change, appetite change, diaphoresis, fatigue and unexpected weight change.  HENT:  Positive for postnasal drip.   Respiratory:  Positive for cough. Negative for apnea, choking, chest tightness, shortness of breath, wheezing and stridor.   Cardiovascular:  Negative for chest pain, palpitations and leg swelling.  Gastrointestinal:  Negative for abdominal pain.  Genitourinary:  Negative for decreased  urine volume and difficulty urinating.  Neurological:  Negative for dizziness, weakness and headaches.  Psychiatric/Behavioral:  Negative for confusion.   All other systems reviewed and are negative.      Objective:  BP 120/67   Pulse 68   Temp 98.9 F (37.2 C)   Ht '6\' 1"'$  (1.854 m)   Wt 219 lb (99.3 kg)   SpO2 97%   BMI 28.89 kg/m    Wt Readings from Last 3 Encounters:  05/08/22 219 lb (99.3 kg)  04/23/22 217 lb (98.4 kg)  03/09/22 223 lb (101.2 kg)    Physical Exam Vitals and nursing note reviewed.  Constitutional:      Appearance: Normal appearance.  HENT:     Head: Normocephalic and atraumatic.     Right Ear: A middle ear effusion is present. Tympanic membrane is not erythematous.     Left Ear: A middle ear effusion is present. Tympanic membrane is not erythematous.     Mouth/Throat:     Lips: Pink.     Mouth: Mucous membranes are moist.     Pharynx: Posterior oropharyngeal erythema present. No pharyngeal swelling, oropharyngeal exudate or uvula swelling.     Tonsils: No tonsillar exudate or tonsillar abscesses.     Comments: Postnasal drainage Eyes:     Conjunctiva/sclera: Conjunctivae normal.     Pupils: Pupils are equal, round, and reactive to light.  Cardiovascular:     Rate and Rhythm: Normal rate and regular rhythm.     Heart sounds:  Normal heart sounds.  Pulmonary:     Effort: Pulmonary effort is normal. No respiratory distress.     Breath sounds: Normal breath sounds. No stridor. No wheezing, rhonchi or rales.  Chest:     Chest wall: No tenderness.  Musculoskeletal:     Right lower leg: No edema.     Left lower leg: No edema.  Skin:    General: Skin is warm and dry.     Capillary Refill: Capillary refill takes less than 2 seconds.  Neurological:     General: No focal deficit present.     Mental Status: He is alert and oriented to person, place, and time.  Psychiatric:        Mood and Affect: Mood normal.        Behavior: Behavior normal.        Thought Content: Thought content normal.        Judgment: Judgment normal.    Results for orders placed or performed in visit on 04/23/22  Urinalysis, Routine w reflex microscopic  Result Value Ref Range   Specific Gravity, UA 1.025 1.005 - 1.030   pH, UA 6.5 5.0 - 7.5   Color, UA Yellow Yellow   Appearance Ur Clear Clear   Leukocytes,UA Negative Negative   Protein,UA Negative Negative/Trace   Glucose, UA Negative Negative   Ketones, UA Negative Negative   RBC, UA Negative Negative   Bilirubin, UA Negative Negative   Urobilinogen, Ur 0.2 0.2 - 1.0 mg/dL   Nitrite, UA Negative Negative   Microscopic Examination Comment      X-Ray: CXR: No acute findings, atherosclerosis. Preliminary x-ray reading by Monia Pouch, FNP-C, [facility].   Pertinent labs & imaging results that were available during my care of the patient were reviewed by me and considered in my medical decision making.  Assessment & Plan:  Rogerio was seen today for cough.  Diagnoses and all orders for this visit:  URI with cough and congestion CXR unremarkable  in office. No indications of heart failure or fluid overload in office. Cough likely for postnasal drip as postnasal drainage is present on exam. Will add Flonase, Mucinex, and Singulair to regimen. If symptoms persist after addition of these  medications, pt to start antibiotic therapy as prescribed, pocket script for Augmentin provided. Report any new, worsening, or persistent symptoms.  -     DG Chest 2 View; Future -     fluticasone (FLONASE) 50 MCG/ACT nasal spray; Place 2 sprays into both nostrils daily. -     guaiFENesin (MUCINEX) 600 MG 12 hr tablet; Take 1 tablet (600 mg total) by mouth 2 (two) times daily for 10 days. -     montelukast (SINGULAIR) 10 MG tablet; Take 1 tablet (10 mg total) by mouth at bedtime. -     amoxicillin-clavulanate (AUGMENTIN) 875-125 MG tablet; Take 1 tablet by mouth 2 (two) times daily for 10 days.     Continue all other maintenance medications.  Follow up plan: Return if symptoms worsen or fail to improve.   Continue healthy lifestyle choices, including diet (rich in fruits, vegetables, and lean proteins, and low in salt and simple carbohydrates) and exercise (at least 30 minutes of moderate physical activity daily).  Educational handout given for URI  The above assessment and management plan was discussed with the patient. The patient verbalized understanding of and has agreed to the management plan. Patient is aware to call the clinic if they develop any new symptoms or if symptoms persist or worsen. Patient is aware when to return to the clinic for a follow-up visit. Patient educated on when it is appropriate to go to the emergency department.   Monia Pouch, FNP-C [facility] [phone]

## 2022-06-02 DIAGNOSIS — S61210A Laceration without foreign body of right index finger without damage to nail, initial encounter: Secondary | ICD-10-CM | POA: Diagnosis not present

## 2022-06-03 DIAGNOSIS — S61210A Laceration without foreign body of right index finger without damage to nail, initial encounter: Secondary | ICD-10-CM | POA: Diagnosis not present

## 2022-06-04 DIAGNOSIS — S61210D Laceration without foreign body of right index finger without damage to nail, subsequent encounter: Secondary | ICD-10-CM | POA: Diagnosis not present

## 2022-06-15 ENCOUNTER — Ambulatory Visit (HOSPITAL_COMMUNITY): Payer: Medicare Other

## 2022-06-29 ENCOUNTER — Ambulatory Visit (HOSPITAL_COMMUNITY)
Admission: RE | Admit: 2022-06-29 | Discharge: 2022-06-29 | Disposition: A | Discharge status: Home / Self Care | Payer: Medicare Other | Source: Ambulatory Visit | Attending: Urology | Admitting: Urology

## 2022-06-29 DIAGNOSIS — K8020 Calculus of gallbladder without cholecystitis without obstruction: Secondary | ICD-10-CM | POA: Diagnosis not present

## 2022-06-29 DIAGNOSIS — N2882 Megaloureter: Secondary | ICD-10-CM | POA: Insufficient documentation

## 2022-06-29 DIAGNOSIS — R351 Nocturia: Secondary | ICD-10-CM | POA: Insufficient documentation

## 2022-07-13 DIAGNOSIS — N130 Hydronephrosis with ureteropelvic junction obstruction: Secondary | ICD-10-CM | POA: Diagnosis not present

## 2022-07-13 DIAGNOSIS — R3912 Poor urinary stream: Secondary | ICD-10-CM | POA: Diagnosis not present

## 2022-07-23 ENCOUNTER — Ambulatory Visit: Payer: Medicare Other | Admitting: Urology

## 2022-09-02 DIAGNOSIS — H5231 Anisometropia: Secondary | ICD-10-CM | POA: Diagnosis not present

## 2022-09-02 DIAGNOSIS — H40003 Preglaucoma, unspecified, bilateral: Secondary | ICD-10-CM | POA: Diagnosis not present

## 2022-09-02 DIAGNOSIS — H5232 Aniseikonia: Secondary | ICD-10-CM | POA: Diagnosis not present

## 2022-09-02 DIAGNOSIS — H2513 Age-related nuclear cataract, bilateral: Secondary | ICD-10-CM | POA: Diagnosis not present

## 2022-11-11 ENCOUNTER — Other Ambulatory Visit: Payer: Self-pay | Admitting: Family Medicine

## 2022-12-11 ENCOUNTER — Ambulatory Visit (INDEPENDENT_AMBULATORY_CARE_PROVIDER_SITE_OTHER): Payer: Medicare Other

## 2022-12-11 VITALS — Ht 73.0 in | Wt 204.0 lb

## 2022-12-11 DIAGNOSIS — Z0000 Encounter for general adult medical examination without abnormal findings: Secondary | ICD-10-CM

## 2022-12-11 NOTE — Progress Notes (Signed)
Subjective:   Marcus Jensen is a 71 y.o. male who presents for Medicare Annual/Subsequent preventive examination. I connected with  BALLARD BUDNEY on 12/11/22 by a audio enabled telemedicine application and verified that I am speaking with the correct person using two identifiers.  Patient Location: Home  Provider Location: Home Office  I discussed the limitations of evaluation and management by telemedicine. The patient expressed understanding and agreed to proceed.  Review of Systems     Cardiac Risk Factors include: advanced age (>70mn, >>66women);male gender     Objective:    Today's Vitals   12/11/22 1412  Weight: 204 lb (92.5 kg)  Height: '6\' 1"'$  (1.854 m)   Body mass index is 26.91 kg/m.     12/11/2022    2:16 PM 12/10/2021    2:12 PM 09/15/2021    8:24 AM 08/19/2016    9:04 AM  Advanced Directives  Does Patient Have a Medical Advance Directive? Yes Yes Yes Yes  Type of AParamedicof AG[place]Living will [place]Living will [place]Living will [place] Does patient want to make changes to medical advance directive?   No - Patient declined   Copy of [place]in Chart? No - copy requested No - copy requested No - copy requested No - copy requested    Current Medications (verified) Outpatient Encounter Medications as of 12/11/2022  Medication Sig   amLODipine (NORVASC) 5 MG tablet Take 1 tablet (5 mg total) by mouth daily.   Cholecalciferol (VITAMIN D) 50 MCG (2000 UT) tablet Take 2,000 Units by mouth in the morning and at bedtime.   famotidine (PEPCID) 10 MG tablet Take 10 mg by mouth daily as needed for heartburn.   gabapentin (NEURONTIN) 600 MG tablet Take 1 tablet (600 mg total) by mouth 3 (three) times daily.   metoprolol succinate (TOPROL-XL) 25 MG 24 hr tablet TAKE 1 TABLET BY MOUTH IN  THE MORNING AND AT BEDTIME (Patient taking differently: daily.)    Naproxen Sodium 220 MG CAPS    rosuvastatin (CRESTOR) 20 MG tablet TAKE 1 TABLET BY MOUTH DAILY   sildenafil (VIAGRA) 100 MG tablet Take 0.5-1 tablets (50-100 mg total) by mouth daily as needed for erectile dysfunction.   fluticasone (FLONASE) 50 MCG/ACT nasal spray Place 2 sprays into both nostrils daily. (Patient not taking: Reported on 12/11/2022)   glucosamine-chondroitin 500-400 MG tablet Take 2 tablets by mouth 2 (two) times daily. (Patient not taking: Reported on 12/11/2022)   montelukast (SINGULAIR) 10 MG tablet Take 1 tablet (10 mg total) by mouth at bedtime. (Patient not taking: Reported on 12/11/2022)   No facility-administered encounter medications on file as of 12/11/2022.    Allergies (verified) Patient has no known allergies.   History: Past Medical History:  Diagnosis Date   Allergy    environmental   Arthritis    Cervical disc disease   GERD (gastroesophageal reflux disease)    Hyperlipidemia    Hypertension    Peripheral neuropathy    PONV (postoperative nausea and vomiting)    Past Surgical History:  Procedure Laterality Date   ANTERIOR CERVICAL DECOMP/DISCECTOMY FUSION N/A 09/17/2021   Procedure: CERVICAL FOUR-FIVE, CERVICAL FIVE-SIX ANTERIOR CERVICAL DECOMPRESSION/DISCECTOMY FUSION;  Surgeon: JEustace Moore MD;  [place] [facility]  Service: Neurosurgery;  Laterality: N/A;  3C   carpal tunnel Bilateral 05/2019   Dr [facility]onia Beets([facility]   COLONOSCOPY     HAND  SURGERY Left    fractured metacarpal 5th digit   HERNIA REPAIR     INGUINAL HERNIA REPAIR Right 08/21/2016   Procedure: RIGHT INGUINAL HERNIORRHAPHY WITH MESH;  Surgeon: Aviva Signs, MD;  Location: [facility];  Service: General;  Laterality: Right;   KNEE SURGERY Bilateral    arthroscopy   SKIN BIOPSY     left side nose   Family History  Problem Relation Age of Onset   CVA Mother 8   CVA Father    Cancer Brother        liver   Colon cancer Neg Hx    Colon polyps Neg Hx    Esophageal  cancer Neg Hx    Rectal cancer Neg Hx    Stomach cancer Neg Hx    Social History   Socioeconomic History   Marital status: Married    Spouse name: Not on file   Number of children: 2   Years of education: Not on file   Highest education level: Not on file  Occupational History   Occupation: Vet    Comment: retired/part time  Tobacco Use   Smoking status: Former    Packs/day: 1.00    Years: 6.00    Total pack years: 6.00    Types: Cigarettes    Quit date: 02/01/1990    Years since quitting: 32.8   Smokeless tobacco: Former    Types: Chew    Quit date: 08/20/1987  Vaping Use   Vaping Use: Never used  Substance and Sexual Activity   Alcohol use: Yes    Alcohol/week: 4.0 standard drinks of alcohol    Types: 2 Glasses of wine, 2 Cans of beer per week   Drug use: No   Sexual activity: Yes    Birth control/protection: None  Other Topics Concern   Not on file  Social History Narrative   Lives with wife   Left Handed   Drinks 4-5 cups caffeine daily   Social Determinants of Health   Financial Resource Strain: Low Risk  (12/11/2022)   Overall Financial Resource Strain (CARDIA)    Difficulty of Paying Living Expenses: Not hard at all  Food Insecurity: No Food Insecurity (12/11/2022)   Hunger Vital Sign    Worried About Running Out of Food in the Last Year: Never true    Ran Out of Food in the Last Year: Never true  Transportation Needs: No Transportation Needs (12/11/2022)   PRAPARE - Hydrologist (Medical): No    Lack of Transportation (Non-Medical): No  Physical Activity: Sufficiently Active (12/11/2022)   Exercise Vital Sign    Days of Exercise per Week: 5 days    Minutes of Exercise per Session: 30 min  Stress: No Stress Concern Present (12/11/2022)   [place]    Feeling of Stress : Not at all  Social Connections: Moderately Integrated (12/11/2022)   Social Connection  and Isolation Panel [NHANES]    Frequency of Communication with Friends and Family: More than three times a week    Frequency of Social Gatherings with Friends and Family: More than three times a week    Attends Religious Services: More than 4 times per year    Active Member of [employer] or Organizations: No    Attends Archivist Meetings: Never    Marital Status: Married    Tobacco Counseling Counseling given: Not Answered   Clinical Intake:  Pre-visit preparation completed: Yes  Pain : No/denies pain     Nutritional Risks: None Diabetes: No  How often do you need to have someone help you when you read instructions, pamphlets, or other written materials from your doctor or pharmacy?: 1 - Never  Diabetic?no   Interpreter Needed?: No  Information entered by :: Jadene Pierini, LPN   Activities of Daily Living    12/11/2022    2:16 PM  In your present state of health, do you have any difficulty performing the following activities:  Hearing? 0  Vision? 0  Difficulty concentrating or making decisions? 0  Walking or climbing stairs? 0  Dressing or bathing? 0  Doing errands, shopping? 0  Preparing Food and eating ? N  Using the Toilet? N  In the past six months, have you accidently leaked urine? N  Do you have problems with loss of bowel control? N  Managing your Medications? N  Managing your Finances? N  Housekeeping or managing your Housekeeping? N    Patient Care Team: Janora Norlander, DO as PCP - General (Family Medicine) Starling Manns, MD (Orthopedic Surgery)  Indicate any recent Medical Services you may have received from other than [facility] providers in the past year (date may be approximate).     Assessment:   This is a routine wellness examination for Viaan.  Hearing/Vision screen Vision Screening - Comments:: Wears rx glasses - up to date with routine eye exams with  Dr.[email or web address]  Dietary issues and exercise activities discussed: Current  Exercise Habits: Home exercise routine, Type of exercise: walking, Time (Minutes): 30, Frequency (Times/Week): 5, Weekly Exercise (Minutes/Week): 150, Intensity: Mild, Exercise limited by: None identified   Goals Addressed             This Visit's Progress    Patient Stated   On track    Stay healthy and active Get pain under control       Depression Screen    12/11/2022    2:15 PM 12/11/2022    2:14 PM 05/08/2022    9:36 AM 03/09/2022   11:33 AM 12/10/2021    2:11 PM 01/03/2021   11:49 AM 06/12/2020   10:15 AM  PHQ 2/9 Scores  PHQ - 2 Score 0 0 0 0 0 0 0  PHQ- 9 Score   0 0  0     Fall Risk    12/11/2022    2:13 PM 05/08/2022    9:37 AM 03/09/2022   11:35 AM 12/10/2021    2:16 PM 06/12/2020   10:15 AM  [place] in the past year? 0 0 0 0 0  Number falls in past yr: 0   0   Injury with Fall? 0   0   Risk for fall due to : No Fall Risks   Orthopedic patient   Follow up Falls prevention discussed   Falls prevention discussed     [place]:  Any stairs in or around the home? Yes  If so, are there any without handrails? No  Home free of loose throw rugs in walkways, pet beds, electrical cords, etc? Yes  Adequate lighting in your home to reduce risk of falls? Yes   ASSISTIVE DEVICES UTILIZED TO PREVENT FALLS:  Life alert? No  Use of a cane, walker or w/c? No  Grab bars in the bathroom? No  Shower chair or bench in shower? No  Elevated toilet seat or a handicapped toilet? No  12/11/2022    2:17 PM 12/10/2021    2:16 PM  6CIT Screen  What Year? 0 points 0 points  What month? 0 points 0 points  What time? 0 points 0 points  Count back from 20 0 points 0 points  Months in reverse 0 points 0 points  Repeat phrase 0 points 0 points  Total Score 0 points 0 points    Immunizations Immunization History  Administered Date(s) Administered   Fluad Quad(high Dose 65+) 09/21/2019   Influenza, High Dose Seasonal  PF 10/06/2018, 10/19/2020   Influenza,inj,Quad PF,6+ Mos 11/08/2013, 11/02/2014, 10/16/2015, 10/03/2016, 10/21/2017   Pneumococcal Conjugate-13 12/16/2016   Pneumococcal Polysaccharide-23 06/20/2018   Rabies, IM 05/26/2014, 05/29/2014, 06/04/2014, 06/11/2014   Tdap 03/09/2022   Zoster, Live 06/11/2015    TDAP status: Up to date  Flu Vaccine status: Up to date  Pneumococcal vaccine status: Up to date  Covid-19 vaccine status: Completed vaccines  Qualifies for Shingles Vaccine? Yes   Zostavax completed No   Shingrix Completed?: No.    Education has been provided regarding the importance of this vaccine. Patient has been advised to call insurance company to determine out of pocket expense if they have not yet received this vaccine. Advised may also receive vaccine at local pharmacy or Health Dept. Verbalized acceptance and understanding.  Screening Tests Health Maintenance  Topic Date Due   COVID-19 Vaccine (1) Never done   Zoster Vaccines- Shingrix (1 of 2) Never done   COLON CANCER SCREENING ANNUAL FOBT  01/21/2021   INFLUENZA VACCINE  07/21/2022   Medicare Annual Wellness (AWV)  12/12/2023   COLONOSCOPY (Pts 45-19yr Insurance coverage will need to be confirmed)  01/21/2025   DTaP/Tdap/Td (2 - Td or Tdap) 03/09/2032   Pneumonia Vaccine 69 Years old  Completed   Hepatitis C Screening  Completed   HPV VACCINES  Aged Out    Health Maintenance  Health Maintenance Due  Topic Date Due   COVID-19 Vaccine (1) Never done   Zoster Vaccines- Shingrix (1 of 2) Never done   COLON CANCER SCREENING ANNUAL FOBT  01/21/2021   INFLUENZA VACCINE  07/21/2022    Colorectal cancer screening: Type of screening: Colonoscopy. Completed 01/22/2020. Repeat every 5 years  Lung Cancer Screening: (Low Dose CT Chest recommended if Age 69-80years, 30 pack-year currently smoking OR have quit w/in 15years.) does not qualify.   Lung Cancer Screening Referral: n/a  Additional  Screening:  Hepatitis C Screening: does not qualify;   Vision Screening: Recommended annual ophthalmology exams for early detection of glaucoma and other disorders of the eye. Is the patient up to date with their annual eye exam?  Yes  Who is the provider or what is the name of the office in which the patient attends annual eye exams? Dr.[email or web address]  If pt is not established with a provider, would they like to be referred to a provider to establish care? No .   Dental Screening: Recommended annual dental exams for proper oral hygiene  Community Resource Referral / Chronic Care Management: CRR required this visit?  No   CCM required this visit?  No      Plan:     I have personally reviewed and noted the following in the patient's chart:   Medical and social history Use of alcohol, tobacco or illicit drugs  Current medications and supplements including opioid prescriptions. Patient is not currently taking opioid prescriptions. Functional ability and status Nutritional status Physical activity Advanced directives List of other physicians Hospitalizations,  surgeries, and ER visits in previous 12 months Vitals Screenings to include cognitive, depression, and falls Referrals and appointments  In addition, I have reviewed and discussed with patient certain preventive protocols, quality metrics, and best practice recommendations. A written personalized care plan for preventive services as well as general preventive health recommendations were provided to patient.     Daphane Shepherd, LPN   [id number]   Nurse Notes: none

## 2022-12-11 NOTE — Patient Instructions (Signed)
[name] , Thank you for taking time to come for your Medicare Wellness Visit. I appreciate your ongoing commitment to your health goals. Please review the following plan we discussed and let me know if I can assist you in the future.   These are the goals we discussed:  Goals      Patient Stated     Stay healthy and active Get pain under control        This is a list of the screening recommended for you and due dates:  Health Maintenance  Topic Date Due   COVID-19 Vaccine (1) Never done   Zoster (Shingles) Vaccine (1 of 2) Never done   Stool Blood Test  01/21/2021   Flu Shot  07/21/2022   Medicare Annual Wellness Visit  12/12/2023   Colon Cancer Screening  01/21/2025   DTaP/Tdap/Td vaccine (2 - Td or Tdap) 03/09/2032   Pneumonia Vaccine  Completed   Hepatitis C Screening: USPSTF Recommendation to screen - Ages 18-79 yo.  Completed   HPV Vaccine  Aged Out    Advanced directives: Please bring a copy of your health care power of attorney and living will to the office to be added to your chart at your convenience.   Conditions/risks identified: Aim for 30 minutes of exercise or brisk walking, 6-8 glasses of water, and 5 servings of fruits and vegetables each day.   Next appointment: Follow up in one year for your annual wellness visit.   Preventive Care 69 Years and Older, Male  Preventive care refers to lifestyle choices and visits with your health care provider that can promote health and wellness. What does preventive care include? A yearly physical exam. This is also called an annual well check. Dental exams once or twice a year. Routine eye exams. Ask your health care provider how often you should have your eyes checked. Personal lifestyle choices, including: Daily care of your teeth and gums. Regular physical activity. Eating a healthy diet. Avoiding tobacco and drug use. Limiting alcohol use. Practicing safe sex. Taking low doses of aspirin every day. Taking  vitamin and mineral supplements as recommended by your health care provider. What happens during an annual well check? The services and screenings done by your health care provider during your annual well check will depend on your age, overall health, lifestyle risk factors, and family history of disease. Counseling  Your health care provider may ask you questions about your: Alcohol use. Tobacco use. Drug use. Emotional well-being. Home and relationship well-being. Sexual activity. Eating habits. History of falls. Memory and ability to understand (cognition). Work and work Statistician. Screening  You may have the following tests or measurements: Height, weight, and BMI. Blood pressure. Lipid and cholesterol levels. These may be checked every 5 years, or more frequently if you are over 28 years old. Skin check. Lung cancer screening. You may have this screening every year starting at age 62 if you have a 30-pack-year history of smoking and currently smoke or have quit within the past 15 years. Fecal occult blood test (FOBT) of the stool. You may have this test every year starting at age 55. Flexible sigmoidoscopy or colonoscopy. You may have a sigmoidoscopy every 5 years or a colonoscopy every 10 years starting at age 46. Prostate cancer screening. Recommendations will vary depending on your family history and other risks. Hepatitis C blood test. Hepatitis B blood test. Sexually transmitted disease (STD) testing. Diabetes screening. This is done by checking your blood sugar (glucose)  after you have not eaten for a while (fasting). You may have this done every 1-3 years. Abdominal aortic aneurysm (AAA) screening. You may need this if you are a current or former smoker. Osteoporosis. You may be screened starting at age 35 if you are at high risk. Talk with your health care provider about your test results, treatment options, and if necessary, the need for more tests. Vaccines  Your  health care provider may recommend certain vaccines, such as: Influenza vaccine. This is recommended every year. Tetanus, diphtheria, and acellular pertussis (Tdap, Td) vaccine. You may need a Td booster every 10 years. Zoster vaccine. You may need this after age 69. Pneumococcal 13-valent conjugate (PCV13) vaccine. One dose is recommended after age 69. Pneumococcal polysaccharide (PPSV23) vaccine. One dose is recommended after age 69. Talk to your health care provider about which screenings and vaccines you need and how often you need them. This information is not intended to replace advice given to you by your health care provider. Make sure you discuss any questions you have with your health care provider. Document Released: 01/03/2016 Document Revised: 08/26/2016 Document Reviewed: 10/08/2015 Elsevier Interactive Patient Education  2017 [place] Prevention in the Home Falls can cause injuries. They can happen to people of all ages. There are many things you can do to make your home safe and to help prevent falls. What can I do on the outside of my home? Regularly fix the edges of walkways and driveways and fix any cracks. Remove anything that might make you trip as you walk through a door, such as a raised step or threshold. Trim any bushes or trees on the path to your home. Use bright outdoor lighting. Clear any walking paths of anything that might make someone trip, such as rocks or tools. Regularly check to see if handrails are loose or broken. Make sure that both sides of any steps have handrails. Any raised decks and porches should have guardrails on the edges. Have any leaves, snow, or ice cleared regularly. Use sand or salt on walking paths during winter. Clean up any spills in your garage right away. This includes oil or grease spills. What can I do in the bathroom? Use night lights. Install grab bars by the toilet and in the tub and shower. Do not use towel bars as  grab bars. Use non-skid mats or decals in the tub or shower. If you need to sit down in the shower, use a plastic, non-slip stool. Keep the floor dry. Clean up any water that spills on the floor as soon as it happens. Remove soap buildup in the tub or shower regularly. Attach bath mats securely with double-sided non-slip rug tape. Do not have throw rugs and other things on the floor that can make you trip. What can I do in the bedroom? Use night lights. Make sure that you have a light by your bed that is easy to reach. Do not use any sheets or blankets that are too big for your bed. They should not hang down onto the floor. Have a firm chair that has side arms. You can use this for support while you get dressed. Do not have throw rugs and other things on the floor that can make you trip. What can I do in the kitchen? Clean up any spills right away. Avoid walking on wet floors. Keep items that you use a lot in easy-to-reach places. If you need to reach something above you, use a  strong step stool that has a grab bar. Keep electrical cords out of the way. Do not use floor polish or wax that makes floors slippery. If you must use wax, use non-skid floor wax. Do not have throw rugs and other things on the floor that can make you trip. What can I do with my stairs? Do not leave any items on the stairs. Make sure that there are handrails on both sides of the stairs and use them. Fix handrails that are broken or loose. Make sure that handrails are as long as the stairways. Check any carpeting to make sure that it is firmly attached to the stairs. Fix any carpet that is loose or worn. Avoid having throw rugs at the top or bottom of the stairs. If you do have throw rugs, attach them to the floor with carpet tape. Make sure that you have a light switch at the top of the stairs and the bottom of the stairs. If you do not have them, ask someone to add them for you. What else can I do to help prevent  falls? Wear shoes that: Do not have high heels. Have rubber bottoms. Are comfortable and fit you well. Are closed at the toe. Do not wear sandals. If you use a stepladder: Make sure that it is fully opened. Do not climb a closed stepladder. Make sure that both sides of the stepladder are locked into place. Ask someone to hold it for you, if possible. Clearly mark and make sure that you can see: Any grab bars or handrails. First and last steps. Where the edge of each step is. Use tools that help you move around (mobility aids) if they are needed. These include: Canes. Walkers. Scooters. Crutches. Turn on the lights when you go into a dark area. Replace any light bulbs as soon as they burn out. Set up your furniture so you have a clear path. Avoid moving your furniture around. If any of your floors are uneven, fix them. If there are any pets around you, be aware of where they are. Review your medicines with your doctor. Some medicines can make you feel dizzy. This can increase your chance of falling. Ask your doctor what other things that you can do to help prevent falls. This information is not intended to replace advice given to you by your health care provider. Make sure you discuss any questions you have with your health care provider. Document Released: 10/03/2009 Document Revised: 05/14/2016 Document Reviewed: 01/11/2015 Elsevier Interactive Patient Education  2017 [employer].

## 2022-12-21 ENCOUNTER — Other Ambulatory Visit: Payer: Self-pay | Admitting: Family Medicine

## 2022-12-22 NOTE — Telephone Encounter (Signed)
Marcus Jensen NTBS in March for his yearly appt. Refill sent

## 2022-12-22 NOTE — Telephone Encounter (Signed)
Left message that made appt in April for cpe

## 2023-01-08 ENCOUNTER — Other Ambulatory Visit: Payer: Self-pay | Admitting: Cardiology

## 2023-02-22 DIAGNOSIS — M1712 Unilateral primary osteoarthritis, left knee: Secondary | ICD-10-CM | POA: Diagnosis not present

## 2023-03-15 ENCOUNTER — Other Ambulatory Visit: Payer: Self-pay | Admitting: Family Medicine

## 2023-03-15 DIAGNOSIS — I10 Essential (primary) hypertension: Secondary | ICD-10-CM

## 2023-03-22 ENCOUNTER — Ambulatory Visit: Payer: Medicare Other | Admitting: Cardiology

## 2023-03-28 ENCOUNTER — Other Ambulatory Visit: Payer: Self-pay | Admitting: Family Medicine

## 2023-03-28 DIAGNOSIS — M4802 Spinal stenosis, cervical region: Secondary | ICD-10-CM

## 2023-03-28 DIAGNOSIS — G609 Hereditary and idiopathic neuropathy, unspecified: Secondary | ICD-10-CM

## 2023-03-28 DIAGNOSIS — M48061 Spinal stenosis, lumbar region without neurogenic claudication: Secondary | ICD-10-CM

## 2023-04-05 ENCOUNTER — Ambulatory Visit (INDEPENDENT_AMBULATORY_CARE_PROVIDER_SITE_OTHER): Payer: Medicare Other | Admitting: Family Medicine

## 2023-04-05 ENCOUNTER — Encounter: Payer: Self-pay | Admitting: Family Medicine

## 2023-04-05 VITALS — BP 128/63 | HR 80 | Temp 98.5°F | Ht 73.0 in | Wt 216.0 lb

## 2023-04-05 DIAGNOSIS — B351 Tinea unguium: Secondary | ICD-10-CM | POA: Diagnosis not present

## 2023-04-05 DIAGNOSIS — M4802 Spinal stenosis, cervical region: Secondary | ICD-10-CM | POA: Diagnosis not present

## 2023-04-05 DIAGNOSIS — E7800 Pure hypercholesterolemia, unspecified: Secondary | ICD-10-CM | POA: Diagnosis not present

## 2023-04-05 DIAGNOSIS — Z86010 Personal history of colonic polyps: Secondary | ICD-10-CM

## 2023-04-05 DIAGNOSIS — Z130 Encounter for screening for diseases of the blood and blood-forming organs and certain disorders involving the immune mechanism: Secondary | ICD-10-CM

## 2023-04-05 DIAGNOSIS — Z0001 Encounter for general adult medical examination with abnormal findings: Secondary | ICD-10-CM

## 2023-04-05 DIAGNOSIS — I10 Essential (primary) hypertension: Secondary | ICD-10-CM

## 2023-04-05 DIAGNOSIS — M48061 Spinal stenosis, lumbar region without neurogenic claudication: Secondary | ICD-10-CM | POA: Diagnosis not present

## 2023-04-05 DIAGNOSIS — G609 Hereditary and idiopathic neuropathy, unspecified: Secondary | ICD-10-CM

## 2023-04-05 DIAGNOSIS — R351 Nocturia: Secondary | ICD-10-CM

## 2023-04-05 DIAGNOSIS — Z125 Encounter for screening for malignant neoplasm of prostate: Secondary | ICD-10-CM

## 2023-04-05 DIAGNOSIS — Z0000 Encounter for general adult medical examination without abnormal findings: Secondary | ICD-10-CM

## 2023-04-05 LAB — URINALYSIS
Bilirubin, UA: NEGATIVE
Glucose, UA: NEGATIVE
Ketones, UA: NEGATIVE
Leukocytes,UA: NEGATIVE
Nitrite, UA: NEGATIVE
Protein,UA: NEGATIVE
RBC, UA: NEGATIVE
Specific Gravity, UA: 1.02 (ref 1.005–1.030)
Urobilinogen, Ur: 0.2 mg/dL (ref 0.2–1.0)
pH, UA: 6 (ref 5.0–7.5)

## 2023-04-05 MED ORDER — ROSUVASTATIN CALCIUM 20 MG PO TABS: 20.0000 mg | ORAL_TABLET | Freq: Every day | ORAL | 3 refills | Status: DC

## 2023-04-05 MED ORDER — GABAPENTIN 600 MG PO TABS: 600.0000 mg | ORAL_TABLET | Freq: Three times a day (TID) | ORAL | 3 refills | Status: DC

## 2023-04-05 MED ORDER — CICLOPIROX 8 % EX SOLN
Freq: Every day | CUTANEOUS | 1 refills | Status: DC
Start: 2023-04-05 — End: 2023-07-26

## 2023-04-05 MED ORDER — AMLODIPINE BESYLATE 5 MG PO TABS
5.0000 mg | ORAL_TABLET | Freq: Every day | ORAL | 3 refills | Status: DC
Start: 2023-04-05 — End: 2024-02-03

## 2023-04-05 NOTE — Progress Notes (Signed)
Marcus Jensen is a 72 y.o. male presents to office today for annual physical exam examination.    Concerns today include: 1. Nail fungus Patient reports he has had some stubborn fungus on a couple of his toenails.  Would like something to treat  Occupation: Vet, Marital status: Married, Substance use: none Diet: typical american/ balanced, Exercise: active Last eye exam: UTD Last dental exam: UTD Last colonoscopy: UTD Refills needed today: all Immunizations needed: Immunization History  Administered Date(s) Administered   Fluad Quad(high Dose 65+) 09/21/2019   Influenza, High Dose Seasonal PF 10/06/2018, 10/19/2020   Influenza,inj,Quad PF,6+ Mos 11/08/2013, 11/02/2014, 10/16/2015, 10/03/2016, 10/21/2017   Pneumococcal Conjugate-13 12/16/2016   Pneumococcal Polysaccharide-23 06/20/2018   Rabies, IM 05/26/2014, 05/29/2014, 06/04/2014, 06/11/2014   Tdap 03/09/2022   Zoster, Live 06/11/2015     Past Medical History:  Diagnosis Date   Allergy    environmental   Arthritis    Cervical disc disease   GERD (gastroesophageal reflux disease)    Hyperlipidemia    Hypertension    Peripheral neuropathy    PONV (postoperative nausea and vomiting)    Social History   Socioeconomic History   Marital status: Married    Spouse name: Not on file   Number of children: 2   Years of education: Not on file   Highest education level: Not on file  Occupational History   Occupation: Vet    Comment: retired/part time  Tobacco Use   Smoking status: Former    Packs/day: 1.00    Years: 6.00    Additional pack years: 0.00    Total pack years: 6.00    Types: Cigarettes    Quit date: 02/01/1990    Years since quitting: 33.1   Smokeless tobacco: Former    Types: Chew    Quit date: 08/20/1987  Vaping Use   Vaping Use: Never used  Substance and Sexual Activity   Alcohol use: Yes    Alcohol/week: 4.0 standard drinks of alcohol    Types: 2 Glasses of wine, 2 Cans of beer per week    Drug use: No   Sexual activity: Yes    Birth control/protection: None  Other Topics Concern   Not on file  Social History Narrative   Lives with wife   Left Handed   Drinks 4-5 cups caffeine daily   Social Determinants of Health   Financial Resource Strain: Low Risk  (12/11/2022)   Overall Financial Resource Strain (CARDIA)    Difficulty of Paying Living Expenses: Not hard at all  Food Insecurity: No Food Insecurity (12/11/2022)   Hunger Vital Sign    Worried About Running Out of Food in the Last Year: Never true    Ran Out of Food in the Last Year: Never true  Transportation Needs: No Transportation Needs (12/11/2022)   PRAPARE - Administrator, Civil Service (Medical): No    Lack of Transportation (Non-Medical): No  Physical Activity: Sufficiently Active (12/11/2022)   Exercise Vital Sign    Days of Exercise per Week: 5 days    Minutes of Exercise per Session: 30 min  Stress: No Stress Concern Present (12/11/2022)   [employer] of Occupational Health - Occupational Stress Questionnaire    Feeling of Stress : Not at all  Social Connections: Moderately Integrated (12/11/2022)   Social Connection and Isolation Panel [NHANES]    Frequency of Communication with Friends and Family: More than three times a week    Frequency of Social Gatherings  with Friends and Family: More than three times a week    Attends Religious Services: More than 4 times per year    Active Member of Clubs or Organizations: No    Attends Banker Meetings: Never    Marital Status: Married  Catering manager Violence: Not At Risk (12/11/2022)   Humiliation, Afraid, Rape, and Kick questionnaire    Fear of Current or Ex-Partner: No    Emotionally Abused: No    Physically Abused: No    Sexually Abused: No   Past Surgical History:  Procedure Laterality Date   ANTERIOR CERVICAL DECOMP/DISCECTOMY FUSION N/A 09/17/2021   Procedure: CERVICAL FOUR-FIVE, CERVICAL FIVE-SIX ANTERIOR  CERVICAL DECOMPRESSION/DISCECTOMY FUSION;  Surgeon: Tia Alert, MD;  Location: [facility];  Service: Neurosurgery;  Laterality: N/A;  3C   carpal tunnel Bilateral 05/2019   Dr Butler Denmark ([facility])   COLONOSCOPY     HAND SURGERY Left    fractured metacarpal 5th digit   HERNIA REPAIR     INGUINAL HERNIA REPAIR Right 08/21/2016   Procedure: RIGHT INGUINAL HERNIORRHAPHY WITH MESH;  Surgeon: Franky Macho, MD;  Location: [facility] OR[facility];  Service: General;  Laterality: Right;   KNEE SURGERY Bilateral    arthroscopy   SKIN BIOPSY     left side nose   Family History  Problem Relation Age of Onset   CVA Mother 34   CVA Father    Cancer Brother        liver   Colon cancer Neg Hx    Colon polyps Neg Hx    Esophageal cancer Neg Hx    Rectal cancer Neg Hx    Stomach cancer Neg Hx     Current Outpatient Medications:    amLODipine (NORVASC) 5 MG tablet, Take 1 tablet (5 mg total) by mouth daily. (NEEDS TO BE SEEN BEFORE NEXT REFILL), Disp: 30 tablet, Rfl: 0   Cholecalciferol (VITAMIN D) 50 MCG (2000 UT) tablet, Take 2,000 Units by mouth in the morning and at bedtime., Disp: , Rfl:    famotidine (PEPCID) 10 MG tablet, Take 10 mg by mouth daily as needed for heartburn., Disp: , Rfl:    fluticasone (FLONASE) 50 MCG/ACT nasal spray, Place 2 sprays into both nostrils daily. (Patient not taking: Reported on 12/11/2022), Disp: 16 g, Rfl: 6   gabapentin (NEURONTIN) 600 MG tablet, TAKE 1 TABLET BY MOUTH 3 TIMES  DAILY, Disp: 300 tablet, Rfl: 0   glucosamine-chondroitin 500-400 MG tablet, Take 2 tablets by mouth 2 (two) times daily. (Patient not taking: Reported on 12/11/2022), Disp: , Rfl:    metoprolol succinate (TOPROL-XL) 25 MG 24 hr tablet, TAKE 1 TABLET BY MOUTH IN THE  MORNING AND AT BEDTIME, Disp: 200 tablet, Rfl: 3   montelukast (SINGULAIR) 10 MG tablet, Take 1 tablet (10 mg total) by mouth at bedtime. (Patient not taking: Reported on 12/11/2022), Disp: 30 tablet, Rfl: 3   Naproxen Sodium 220  MG CAPS, , Disp: , Rfl:    rosuvastatin (CRESTOR) 20 MG tablet, TAKE 1 TABLET BY MOUTH DAILY, Disp: 100 tablet, Rfl: 0   sildenafil (VIAGRA) 100 MG tablet, Take 0.5-1 tablets (50-100 mg total) by mouth daily as needed for erectile dysfunction., Disp: 5 tablet, Rfl: 11  No Known Allergies   ROS: Review of Systems A comprehensive review of systems was negative except for: Eyes: positive for contacts/glasses Respiratory: positive for mild cough after COVID  Genitourinary: positive for nocturia Musculoskeletal: positive for intermittent right tingling in RUE Behavioral/Psych: positive  for stress    Physical exam BP 128/63   Pulse 80   Temp 98.5 F (36.9 C)   Ht  (1.854 m)   Wt 216 lb (98 kg)   SpO2 96%   BMI 28.50 kg/m  General appearance: alert, cooperative, appears stated age, and no distress Head: Normocephalic, without obvious abnormality, atraumatic Eyes: negative findings: lids and lashes normal, conjunctivae and sclerae normal, corneas clear, and pupils equal, round, reactive to light and accomodation Ears: normal TM's and external ear canals both ears Nose: Nares normal. Septum midline. Mucosa normal. No drainage or sinus tenderness. Throat: lips, mucosa, and tongue normal; teeth and gums normal Neck: no adenopathy, no carotid bruit, supple, symmetrical, trachea midline, and thyroid not enlarged, symmetric, no tenderness/mass/nodules Back: symmetric, no curvature. ROM normal. No CVA tenderness. Lungs: clear to auscultation bilaterally Heart: regular rate and rhythm, S1, S2 normal, no murmur, click, rub or gallop Abdomen: soft, non-tender; bowel sounds normal; no masses,  no organomegaly Extremities: extremities normal, atraumatic, no cyanosis or edema Pulses: 2+ and symmetric Skin:  Healing post cryo site on the right posterior aspect of the upper extremity Lymph nodes: Cervical, supraclavicular, and axillary nodes normal. Neurologic: Grossly normal Psych: Stressed  when talking about work but otherwise doing very well     04/05/2023    9:04 AM 12/11/2022    2:15 PM 12/11/2022    2:14 PM  Depression screen PHQ 2/9  Decreased Interest 0 0 0  Down, Depressed, Hopeless 0 0 0  PHQ - 2 Score 0 0 0  Altered sleeping 0    Tired, decreased energy 0    Change in appetite 0    Feeling bad or failure about yourself  0    Trouble concentrating 0    Moving slowly or fidgety/restless 0    Suicidal thoughts 0    PHQ-9 Score 0    Difficult doing work/chores Not difficult at all        04/05/2023    9:04 AM 05/08/2022    9:36 AM 03/09/2022   11:34 AM  GAD 7 : Generalized Anxiety Score  Nervous, Anxious, on Edge 0 0 0  Control/stop worrying 0 0 0  Worry too much - different things 0 0 0  Trouble relaxing 0 0 0  Restless 0 0 0  Easily annoyed or irritable 0 0 0  Afraid - awful might happen 0 0 0  Total GAD 7 Score 0 0 0  Anxiety Difficulty Not difficult at all Not difficult at all Not difficult at all    Assessment/ Plan: Marni Griffon here for annual physical exam.   Annual physical exam  Pure hypercholesterolemia - Plan: CMP14+EGFR, TSH, Lipid Panel, rosuvastatin (CRESTOR) 20 MG tablet  Essential hypertension - Plan: CMP14+EGFR, amLODipine (NORVASC) 5 MG tablet  Spinal stenosis of cervical region - Plan: gabapentin (NEURONTIN) 600 MG tablet  Degenerative lumbar spinal stenosis - Plan: gabapentin (NEURONTIN) 600 MG tablet  Idiopathic peripheral neuropathy - Plan: gabapentin (NEURONTIN) 600 MG tablet  Nocturia - Plan: Urinalysis  Screening PSA (prostate specific antigen) - Plan: PSA  Screening for deficiency anemia - Plan: CBC  History of colon polyps - Plan: Fecal occult blood, imunochemical([facility])  Onychomycosis of toenail - Plan: ciclopirox (PENLAC) 8 % solution  Check fasting labs.  Continue statin.  Blood pressure well-controlled.  Check renal function, liver enzymes  Gabapentin working well overall to control pain.   The right upper extremity radicular symptoms seem to be improving over the  last couple of days so hopefully he will not have to see the C-spine specialist again  Urinalysis without evidence of urinary tract infection or other abnormality.  Check prostate antigen  FOBT, CBC collected given history of colonic polyps.  Not due for colonoscopy  Penlac solution prescribed for onychomycotic changes of the nails.  Follow-up as needed this issue  Counseled on healthy lifestyle choices, including diet (rich in fruits, vegetables and lean meats and low in salt and simple carbohydrates) and exercise (at least 30 minutes of moderate physical activity daily).  Patient to follow up in 1 year for annual exam or sooner if needed.  Virdia Ziesmer M. Nadine Counts, DO

## 2023-04-06 LAB — CMP14+EGFR
ALT: 21 IU/L (ref 0–44)
AST: 25 IU/L (ref 0–40)
Albumin/Globulin Ratio: 2.2 (ref 1.2–2.2)
Albumin: 4.3 g/dL (ref 3.9–4.9)
Alkaline Phosphatase: 82 IU/L (ref 44–121)
BUN/Creatinine Ratio: 16 (ref 10–24)
BUN: 19 mg/dL (ref 8–27)
Bilirubin Total: 0.8 mg/dL (ref 0.0–1.2)
CO2: 24 mmol/L (ref 20–29)
Calcium: 9.7 mg/dL (ref 8.6–10.2)
Chloride: 101 mmol/L (ref 96–106)
Creatinine, Ser: 1.18 mg/dL (ref 0.76–1.27)
Globulin, Total: 2 g/dL (ref 1.5–4.5)
Glucose: 91 mg/dL (ref 70–99)
Potassium: 4.1 mmol/L (ref 3.5–5.2)
Sodium: 138 mmol/L (ref 134–144)
Total Protein: 6.3 g/dL (ref 6.0–8.5)
eGFR: 67 mL/min/{1.73_m2} (ref >59)

## 2023-04-06 LAB — CBC
Hematocrit: 41 % (ref 37.5–51.0)
Hemoglobin: 13.5 g/dL (ref 13.0–17.7)
MCH: 29.7 pg (ref 26.6–33.0)
MCHC: 32.9 g/dL (ref 31.5–35.7)
MCV: 90 fL (ref 79–97)
Platelets: 195 10*3/uL (ref 150–450)
RBC: 4.54 x10E6/uL (ref 4.14–5.80)
RDW: 11.8 % (ref 11.6–15.4)
WBC: 4.7 10*3/uL (ref 3.4–10.8)

## 2023-04-06 LAB — LIPID PANEL
Chol/HDL Ratio: 2.8 ratio (ref 0.0–5.0)
Cholesterol, Total: 138 mg/dL (ref 100–199)
HDL: 49 mg/dL (ref >39)
LDL Chol Calc (NIH): 75 mg/dL (ref 0–99)
Triglycerides: 70 mg/dL (ref 0–149)
VLDL Cholesterol Cal: 14 mg/dL (ref 5–40)

## 2023-04-06 LAB — TSH: TSH: 0.643 u[IU]/mL (ref 0.450–4.500)

## 2023-04-06 LAB — PSA: Prostate Specific Ag, Serum: 4 ng/mL (ref 0.0–4.0)

## 2023-04-29 DIAGNOSIS — M1712 Unilateral primary osteoarthritis, left knee: Secondary | ICD-10-CM | POA: Diagnosis not present

## 2023-05-02 NOTE — Progress Notes (Signed)
.   Cardiology Office Note:   Date:  05/03/2023  ID:  Marcus Jensen, DOB 05/22/1951, MRN [id number]  History of Present Illness:   Marcus Jensen is a 72 y.o. male who presents for evaluation of palpitations.  He was referred by Raliegh Ip, DO for evaluation of ventricular ectopy.  He had a negative POET (Plain Old Exercise Treadmill) in 2019.   He rarely has palpitations.  He stays physically active in the yard not exercising per se but does a lot of heavy work. The patient denies any new symptoms such as chest discomfort, neck or arm discomfort. There has been no new shortness of breath, PND or orthopnea. There have been no reported palpitations, presyncope or syncope.    ROS: As stated in the HPI and negative for all other systems.  Studies Reviewed:    EKG: Sinus bradycardia, rate 48, axis within normal limits, first-degree block no acute ST-T wave changes.   Risk Assessment/Calculations:              Physical Exam:   VS:  BP 132/62   Pulse (!) 48   Ht 6\' 1"  (1.854 m)   Wt 215 lb 3.2 oz (97.6 kg)   SpO2 95%   BMI 28.39 kg/m    Wt Readings from Last 3 Encounters:  05/03/23 215 lb 3.2 oz (97.6 kg)  04/05/23 216 lb (98 kg)  12/11/22 204 lb (92.5 kg)     GEN: Well nourished, well developed in no acute distress NECK: No JVD; No carotid bruits CARDIAC: RRR, 2 out of 6 brief apical systolic murmur heard at the apex and slightly at the aortic outflow tract no diastolic murmurs, rubs, gallops RESPIRATORY:  Clear to auscultation without rales, wheezing or rhonchi  ABDOMEN: Soft, non-tender, non-distended EXTREMITIES:  No edema; No deformity   ASSESSMENT AND PLAN:   PALPITATIONS: These are rare premature atrial contractions.  No change in therapy.  He actually went down on his atenolol to once daily and he does well with this.  DYSLIPIDEMIA: He has been taking Lipitor and I would have him go to take the full 20 mg.  He is actually only been taking half of the prescribed  20.  MURMUR: He has a slight systolic murmur which I suspect is aortic sclerosis.  No change in therapy.  I will follow this clinically.    Signed, Rollene Rotunda, MD

## 2023-05-03 ENCOUNTER — Encounter: Payer: Self-pay | Admitting: Cardiology

## 2023-05-03 ENCOUNTER — Ambulatory Visit: Payer: Medicare Other | Attending: Cardiology | Admitting: Cardiology

## 2023-05-03 VITALS — BP 132/62 | HR 48 | Ht 73.0 in | Wt 215.2 lb

## 2023-05-03 DIAGNOSIS — R002 Palpitations: Secondary | ICD-10-CM

## 2023-05-03 DIAGNOSIS — E785 Hyperlipidemia, unspecified: Secondary | ICD-10-CM

## 2023-05-03 MED ORDER — METOPROLOL SUCCINATE ER 25 MG PO TB24: 25.0000 mg | ORAL_TABLET | ORAL | 3 refills | Status: DC

## 2023-05-03 NOTE — Patient Instructions (Signed)
Medication Instructions:  Your physician recommends that you continue on your current medications as directed. Please refer to the Current Medication list given to you today.  *If you need a refill on your cardiac medications before your next appointment, please call your pharmacy*  Follow-Up: At [facility], you and your health needs are our priority.  As part of our continuing mission to provide you with exceptional heart care, we have created designated Provider Care Teams.  These Care Teams include your primary Cardiologist (physician) and Advanced Practice Providers (APPs -  Physician Assistants and Nurse Practitioners) who all work together to provide you with the care you need, when you need it.  We recommend signing up for the patient portal called "MyChart".  Sign up information is provided on this After Visit Summary.  MyChart is used to connect with patients for Virtual Visits (Telemedicine).  Patients are able to view lab/test results, encounter notes, upcoming appointments, etc.  Non-urgent messages can be sent to your provider as well.   To learn more about what you can do with MyChart, go to [email or web address].    Your next appointment:   12 month(s)  Provider:   James Hochrein, MD     

## 2023-07-09 IMAGING — RF DG CERVICAL SPINE 1V
1 series · 2 of 2 positions shown · non-contrast
Comparison: 08/19/2021

CLINICAL DATA: ACDF C4-5, C5-6

EXAM:
DG CERVICAL SPINE - 1 VIEW

[Series 1: run · 2 of 2 slices shown]
[im 1/2]
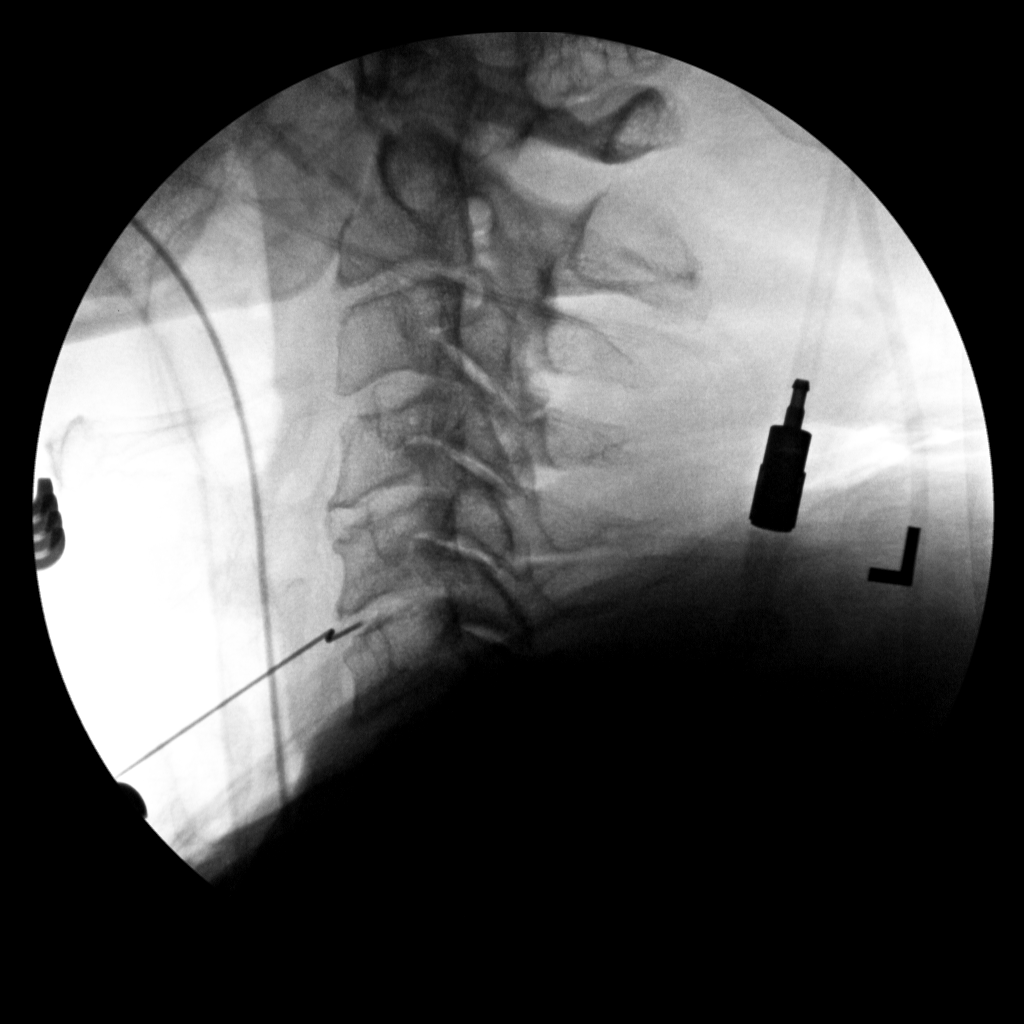
[im 2/2]
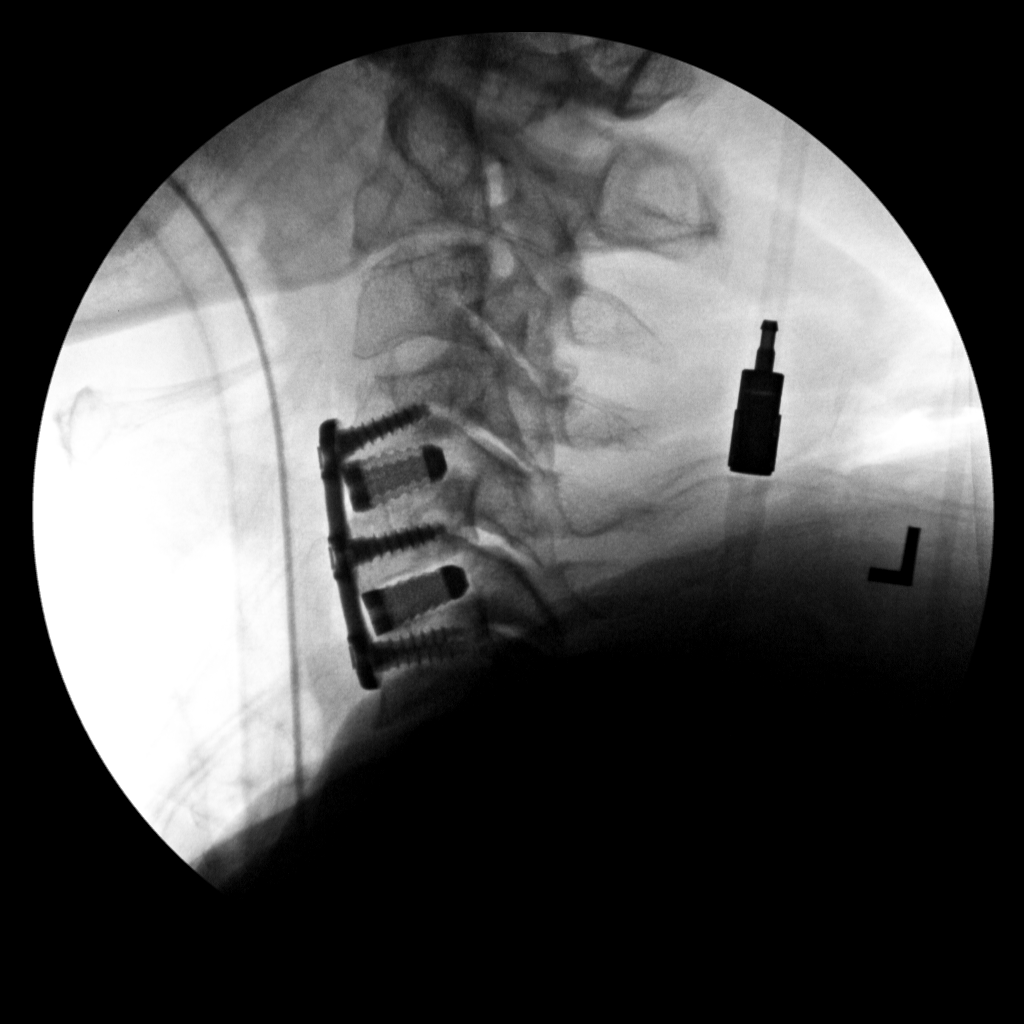

[2 of 2 positions shown; findings below may reference images not displayed]

FINDINGS: First lateral intraoperative image demonstrates anterior localizing
instrument overlying the C5-6 disc space anteriorly.

Second lateral intraoperative image demonstrates changes of anterior
fusion from C4-C6. Normal alignment. No hardware complicating
feature.
IMPRESSION: C4-C6 ACDF.  No visible complicating feature.

## 2023-07-19 ENCOUNTER — Encounter: Payer: Self-pay | Admitting: Family Medicine

## 2023-07-26 ENCOUNTER — Other Ambulatory Visit: Payer: Self-pay | Admitting: Family Medicine

## 2023-07-26 DIAGNOSIS — B351 Tinea unguium: Secondary | ICD-10-CM

## 2023-07-26 MED ORDER — CICLOPIROX 8 % EX SOLN: Freq: Every day | CUTANEOUS | 1 refills | Status: DC

## 2023-08-05 ENCOUNTER — Other Ambulatory Visit: Payer: Medicare Other

## 2023-08-05 DIAGNOSIS — Z86010 Personal history of colonic polyps: Secondary | ICD-10-CM | POA: Diagnosis not present

## 2023-09-21 ENCOUNTER — Other Ambulatory Visit: Payer: Self-pay

## 2023-09-21 ENCOUNTER — Encounter: Payer: Self-pay | Admitting: General Surgery

## 2023-09-21 ENCOUNTER — Ambulatory Visit: Payer: Medicare Other | Admitting: General Surgery

## 2023-09-21 VITALS — BP 111/72 | HR 53 | Temp 98.3°F | Resp 16 | Ht 73.0 in | Wt 208.0 lb

## 2023-09-21 DIAGNOSIS — K4090 Unilateral inguinal hernia, without obstruction or gangrene, not specified as recurrent: Secondary | ICD-10-CM

## 2023-09-21 DIAGNOSIS — I493 Ventricular premature depolarization: Secondary | ICD-10-CM | POA: Insufficient documentation

## 2023-09-22 NOTE — Progress Notes (Signed)
Marcus Jensen; [id number]; Jan 05, 1951   HPI Patient is a 72 year old white male who was referred to my care by Dr. Nadine Counts for evaluation and treatment of a left inguinal hernia.  He states he noticed it earlier this year.  It occasionally pops out when he is straining or coughing.  It is not causing him too much discomfort at the present time.  He is able to easily reduce it.  He denies any nausea or vomiting.  He is status post a right inguinal herniorrhaphy with mesh in 2017. Past Medical History:  Diagnosis Date   Allergy    environmental   Arthritis    Cervical disc disease   GERD (gastroesophageal reflux disease)    Hyperlipidemia    Hypertension    Peripheral neuropathy    PONV (postoperative nausea and vomiting)     Past Surgical History:  Procedure Laterality Date   ANTERIOR CERVICAL DECOMP/DISCECTOMY FUSION N/A 09/17/2021   Procedure: CERVICAL FOUR-FIVE, CERVICAL FIVE-SIX ANTERIOR CERVICAL DECOMPRESSION/DISCECTOMY FUSION;  Surgeon: Tia Alert, MD;  Location: [facility] OR;  Service: Neurosurgery;  Laterality: N/A;  3C   carpal tunnel Bilateral 05/2019   Dr Butler Denmark ([facility])   COLONOSCOPY     HAND SURGERY Left    fractured metacarpal 5th digit   HERNIA REPAIR     INGUINAL HERNIA REPAIR Right 08/21/2016   Procedure: RIGHT INGUINAL HERNIORRHAPHY WITH MESH;  Surgeon: Franky Macho, MD;  Location: [facility] ORS;  Service: General;  Laterality: Right;   KNEE SURGERY Bilateral    arthroscopy   SKIN BIOPSY     left side nose    Family History  Problem Relation Age of Onset   CVA Mother 68   CVA Father    Cancer Brother        liver   Colon cancer Neg Hx    Colon polyps Neg Hx    Esophageal cancer Neg Hx    Rectal cancer Neg Hx    Stomach cancer Neg Hx     Current Outpatient Medications on File Prior to Visit  Medication Sig Dispense Refill   amLODipine (NORVASC) 5 MG tablet Take 1 tablet (5 mg total) by mouth daily. 100 tablet 3   Cholecalciferol (VITAMIN  D) 50 MCG (2000 UT) tablet Take 2,000 Units by mouth in the morning and at bedtime.     ciclopirox (PENLAC) 8 % solution Apply topically at bedtime. Apply over nail and surrounding skin. Apply daily over previous coat. After seven (7) days, may remove with alcohol and continue cycle. 13.2 mL 1   famotidine (PEPCID) 10 MG tablet Take 10 mg by mouth daily as needed for heartburn.     gabapentin (NEURONTIN) 600 MG tablet Take 1 tablet (600 mg total) by mouth 3 (three) times daily. 300 tablet 3   glucosamine-chondroitin 500-400 MG tablet Take 2 tablets by mouth 2 (two) times daily.     metoprolol succinate (TOPROL-XL) 25 MG 24 hr tablet Take 1 tablet (25 mg total) by mouth every morning. 100 tablet 3   Naproxen Sodium 220 MG CAPS      rosuvastatin (CRESTOR) 20 MG tablet Take 1 tablet (20 mg total) by mouth daily. 100 tablet 3   sildenafil (VIAGRA) 100 MG tablet Take 0.5-1 tablets (50-100 mg total) by mouth daily as needed for erectile dysfunction. 5 tablet 11   No current facility-administered medications on file prior to visit.    No Known Allergies  Social History   Substance and Sexual Activity  Alcohol Use Yes   Alcohol/week: 4.0 standard drinks of alcohol   Types: 2 Glasses of wine, 2 Cans of beer per week    Social History   Tobacco Use  Smoking Status Former   Current packs/day: 0.00   Average packs/day: 1 pack/day for 6.0 years (6.0 ttl pk-yrs)   Types: Cigarettes   Start date: 02/02/1984   Quit date: 02/01/1990   Years since quitting: 33.6   Passive exposure: Never  Smokeless Tobacco Former   Types: Chew   Quit date: 08/20/1987    Review of Systems  Constitutional: Negative.   HENT: Negative.    Eyes: Negative.   Respiratory: Negative.    Cardiovascular: Negative.   Gastrointestinal:  Positive for heartburn.  Genitourinary:  Positive for frequency and urgency.  Musculoskeletal:  Positive for back pain and neck pain.  Skin: Negative.   Neurological:  Positive for  sensory change.  Endo/Heme/Allergies: Negative.   Psychiatric/Behavioral: Negative.      Objective   Vitals:   09/21/23 1407  BP: 111/72  Pulse: (!) 53  Resp: 16  Temp: 98.3 F (36.8 C)  SpO2: 96%    Physical Exam Vitals reviewed.  Constitutional:      Appearance: Normal appearance. He is normal weight. He is not ill-appearing.  HENT:     Head: Normocephalic and atraumatic.  Cardiovascular:     Rate and Rhythm: Normal rate and regular rhythm.     Heart sounds: Normal heart sounds. No murmur heard.    No friction rub. No gallop.  Pulmonary:     Effort: Pulmonary effort is normal. No respiratory distress.     Breath sounds: Normal breath sounds. No stridor. No wheezing, rhonchi or rales.  Abdominal:     General: Bowel sounds are normal. There is no distension.     Palpations: Abdomen is soft. There is no mass.     Tenderness: There is no abdominal tenderness. There is no guarding or rebound.     Hernia: A hernia is present.     Comments: Small easily reducible left inguinal hernia.  Skin:    General: Skin is warm and dry.  Neurological:     Mental Status: He is alert and oriented to person, place, and time.     Assessment  Left inguinal hernia.  At this point, it is not significantly symptomatic. Plan  I told the patient that if he becomes more symptomatic, we would then proceed with a left inguinal herniorrhaphy.  He understands and agrees.  Follow-up here expectantly.

## 2023-10-19 ENCOUNTER — Ambulatory Visit: Payer: Medicare Other | Admitting: General Surgery

## 2024-01-31 ENCOUNTER — Telehealth: Payer: Self-pay | Admitting: Cardiology

## 2024-01-31 ENCOUNTER — Encounter: Payer: Self-pay | Admitting: Cardiology

## 2024-01-31 NOTE — Telephone Encounter (Signed)
 Patient c/o Palpitations:  High priority if patient c/o lightheadedness, shortness of breath, or chest pain  How long have you had palpitations/irregular HR/ Afib? Are you having the symptoms now? No symptoms currently and for 3 weeks   Are you currently experiencing lightheadedness, SOB or CP? Lightheadedness and some SOB   Do you have a history of afib (atrial fibrillation) or irregular heart rhythm? Yes   Have you checked your BP or HR? (document readings if available):   Are you experiencing any other symptoms? No

## 2024-01-31 NOTE — Telephone Encounter (Signed)
 Duplicate message see [facility] message already taken care of.

## 2024-02-01 NOTE — Progress Notes (Signed)
 Ophthalmology Department Clinical Visit Note     CHIEF COMPLAINT Patient presents for Cataract   HISTORY OF PRESENT ILLNESS: Marcus Jensen is a 73 y.o. male who presents to the clinic today for a delayed annual cataract evaluation. Originally referred by Dr. Scarlet.   Dr. Pickett reports he still has double vision when he is tired but can manage it.  Of note, pt is a International aid/development worker.   HPI     Cataract   The patient is here for a return visit.  The patient reports starbursts and floaters.  The patient prefers to wait on surgery for now.  The patient requests a new glasses prescription.  The patient does not need an eye medication refill.        Comments   delayed annual cataract evaluation: Still having double vision but still seeing ok.Having just a little trouble with glare but noting major.      Last edited by Stephane JONELLE Kerns, COA on 02/02/2024  8:35 AM.     HISTORICAL INFORMATION:   CURRENT MEDICATIONS: Current Outpatient Medications (Ophthalmic Drugs)  Medication Sig  . prednisoLONE acetate (PRED FORTE) 1 % ophthalmic suspension Administer 1 drop into the right eye 2 (two) times a day. (Patient not taking: Reported on 02/02/2024)   No current facility-administered medications for this visit. (Ophthalmic Drugs)   Current Outpatient Medications (Other)  Medication Sig  . amLODIPine  (NORVASC ) 5 mg tablet Take 5 mg by mouth Once Daily.  SABRA b complex vitamins cap capsule Take 1 capsule by mouth.  . cholecalciferol (VITAMIN D3) 1,000 unit (25 mcg) tablet Take 2,000 Units by mouth.  . gabapentin  (NEURONTIN ) 400 mg capsule Take 600 mg by mouth.  SABRA glucosamine su 2KCl-chondroit 500-400 mg tab Take  by mouth.  . metoprolol  succinate (TOPROL  XL) 25 mg 24 hr tablet   . naproxen (NAPROSYN) 125 mg/5 mL susp oral suspension naproxen  . rosuvastatin  (CRESTOR ) 20 mg tablet rosuvastatin  calcium   20 mg tabs   No current facility-administered medications for this visit. (Other)     ALLERGIES No Known Allergies  PAST MEDICAL HISTORY Past Medical History:  Diagnosis Date  . Arthritis   . High cholesterol   . Peripheral neuropathy    Past Surgical History:  Procedure Laterality Date  . KNEE SURGERY     Procedure: KNEE SURGERY    FAMILY HISTORY Family History  Problem Relation Name Age of Onset  . Glaucoma Mother    . Amblyopia Neg Hx    . Blindness Neg Hx    . Hypertension Neg Hx    . Macular degeneration Neg Hx    . Retinal detachment Neg Hx    . Strabismus Neg Hx      SOCIAL HISTORY Social History   Tobacco Use  . Smoking status: Former  . Smokeless tobacco: Never  Substance Use Topics  . Alcohol use: Yes         OPHTHALMIC EXAM:   Base Eye Exam     Visual Acuity (Snellen - Linear)       Right Left   Dist cc 20/20 20/20    Correction: Glasses         Tonometry (Tonopen: Tetracaine OU, 8:24 AM)       Right Left   Pressure 16 14         Pupils       APD   Right None   Left None         Visual Fields  Left Right    Full Full         Extraocular Movement       Right Left    Full Full         Neuro/Psych     Oriented x3: Yes   Mood/Affect: Normal         Dilation     Both eyes: 2.5% Phenylephrine , 1.0% Tropicamide @ 8:24 AM           Slit Lamp and Fundus Exam     External Exam       Right Left   External Normal Normal         Slit Lamp Exam       Right Left   Lids/Lashes Normal Normal   Conjunctiva/Sclera White and quiet White and quiet   Cornea Clear Clear   Anterior Chamber Deep and quiet Deep and quiet   Iris Round and reactive Round and reactive   Lens 2+ NS 2+ NS   Anterior Vitreous Weiss Ring Normal         Fundus Exam       Right Left   Disc Normal Normal   C/D Ratio 0.3 0.3   Macula Rare drusen Rare drusen           Refraction     Wearing Rx       Sphere Cylinder Axis Add   Right -2.00 +1.00 005 +2.00   Left -4.25 +4.00 170 +2.00          Manifest Refraction       Sphere Cylinder Axis Dist [place] Add Near [place]   Right -2.50 +1.00 005 20/20 +2.50 J1+   Left -4.25 +4.00 170 20/20 +2.50 J1+            IMAGING AND PROCEDURES:    ASSESSMENT/PLAN:  1. Age-related nuclear cataract of both eyes      2. Anisometropia and aniseikonia      3. Glaucoma suspect of both eyes        Cataract OU - The patient complains of painless and progressive decreased vision in both eyes equally since 2 years ago, exacerbated by dim light conditions, and interfering with activities of daily living. - The patient expresses a desire for improved vision and is interested in surgical correction if indicated.  - A reasonable expectation exists that lens surgery will significantly improve both the visual and functional status of the patient. - Discussed the option of multifocal lenses, explained that I would not advise having them placed vs monofocal lenses due to decreased overall visual potential. Pt wishes to proceed with monofocal lenses. - Discussed laser-assisted phaco vs traditional phaco as possible treatment with rba. Pt wishes to proceed with observation.   Anisometropia leading to Aniseikonia  - Discussed CTLs as potential treatment option. Explained to pt difference in astigmatism between eyes, CTLs may help resolve diplopia. Pt expressed understanding and would like to defer.   ? Glaucoma suspect - Low suspicion given healthy nerves  - ? Suspicion w/ glaucoma in pt's mother    RTC - 1 year annual cataract follow up Return in about 1 year (around 02/01/2025) for Follow up, Dilated exam.   There are no Patient Instructions on file for this visit.   Ophthalmic Meds Ordered this visit:  There were no meds ordered this visit.    Explained the diagnoses, plan, and follow up with the patient and they expressed understanding.  Patient expressed understanding of the importance of  proper follow up care.    Abbreviations: M  myopia (nearsighted); A astigmatism; H hyperopia (farsighted); P presbyopia; Mrx spectacle prescription;  CTL contact lenses; OD right eye; OS left eye; OU both eyes  XT exotropia; ET esotropia; PEK punctate epithelial keratitis; PEE punctate epithelial erosions; DES dry eye syndrome; MGD meibomian gland dysfunction; ATs artificial tears; PFAT's preservative free artificial tears; NSC nuclear sclerotic cataract; PSC posterior subcapsular cataract; ERM epi-retinal membrane; PVD posterior vitreous detachment; RD retinal detachment; DM diabetes mellitus; DR diabetic retinopathy; NPDR non-proliferative diabetic retinopathy; PDR proliferative diabetic retinopathy; CSME clinically significant macular edema; DME diabetic macular edema; dbh dot blot hemorrhages; CWS cotton wool spot; POAG primary open angle glaucoma; C/D cup-to-disc ratio; HVF humphrey visual field; GVF goldmann visual field; OCT optical coherence tomography; IOP intraocular pressure; BRVO Branch retinal vein occlusion; CRVO central retinal vein occlusion; CRAO central retinal artery occlusion; BRAO branch retinal artery occlusion; RT retinal tear; SB scleral buckle; PPV pars plana vitrectomy; VH Vitreous hemorrhage; PRP panretinal laser photocoagulation; IVK intravitreal kenalog; VMT vitreomacular traction; MH Macular hole;  NVD neovascularization of the disc; NVE neovascularization elsewhere; AREDS age related eye disease study; ARMD age related macular degeneration; POAG primary open angle glaucoma; EBMD epithelial/anterior basement membrane dystrophy; ACIOL anterior chamber intraocular lens; IOL intraocular lens; PCIOL posterior chamber intraocular lens; Phaco/IOL phacoemulsification with intraocular lens placement; PRK photorefractive keratectomy; LASIK laser assisted in situ keratomileusis; HTN hypertension; DM diabetes mellitus; COPD chronic obstructive pulmonary disease   This document serves as a record of services personally performed by  Donnice Sickles, MD. It was created on their behalf by Jinnie Caldron, Scribe, a trained medical scribe. The creation of this record is the provider's dictation and/or activities during the visit.

## 2024-02-02 ENCOUNTER — Other Ambulatory Visit: Payer: Self-pay | Admitting: Family Medicine

## 2024-02-02 DIAGNOSIS — H5232 Aniseikonia: Secondary | ICD-10-CM | POA: Diagnosis not present

## 2024-02-02 DIAGNOSIS — H2513 Age-related nuclear cataract, bilateral: Secondary | ICD-10-CM | POA: Diagnosis not present

## 2024-02-02 DIAGNOSIS — H40003 Preglaucoma, unspecified, bilateral: Secondary | ICD-10-CM | POA: Diagnosis not present

## 2024-02-02 DIAGNOSIS — H5231 Anisometropia: Secondary | ICD-10-CM | POA: Diagnosis not present

## 2024-02-02 DIAGNOSIS — I10 Essential (primary) hypertension: Secondary | ICD-10-CM

## 2024-02-03 ENCOUNTER — Encounter: Payer: Self-pay | Admitting: Family Medicine

## 2024-02-03 NOTE — Telephone Encounter (Signed)
[facility] NTBS in April for annual CPE RF sent to pharmacy

## 2024-02-03 NOTE — Telephone Encounter (Signed)
LMTCB to schedule apt.

## 2024-02-07 ENCOUNTER — Ambulatory Visit: Payer: Medicare Other | Attending: Physician Assistant | Admitting: Cardiology

## 2024-02-07 ENCOUNTER — Encounter: Payer: Self-pay | Admitting: Physician Assistant

## 2024-02-07 VITALS — BP 126/70 | HR 61 | Ht 72.0 in | Wt 226.4 lb

## 2024-02-07 DIAGNOSIS — I10 Essential (primary) hypertension: Secondary | ICD-10-CM | POA: Diagnosis not present

## 2024-02-07 DIAGNOSIS — E785 Hyperlipidemia, unspecified: Secondary | ICD-10-CM | POA: Diagnosis not present

## 2024-02-07 DIAGNOSIS — Z79899 Other long term (current) drug therapy: Secondary | ICD-10-CM

## 2024-02-07 DIAGNOSIS — R002 Palpitations: Secondary | ICD-10-CM | POA: Diagnosis not present

## 2024-02-07 DIAGNOSIS — R011 Cardiac murmur, unspecified: Secondary | ICD-10-CM | POA: Diagnosis not present

## 2024-02-07 LAB — TSH: TSH: 0.988 u[IU]/mL (ref 0.450–4.500)

## 2024-02-07 NOTE — Progress Notes (Signed)
   Cardiology Office Note:  .   Date:  02/07/2024  ID:  JORRELL KUSTER, DOB 03-Oct-1951, MRN [id number] PCP: Raliegh Ip, DO  [facility] Providers Cardiologist:  Rollene Rotunda, MD {  History of Present Illness: .   Marcus Jensen is a 73 y.o. male with HTN, HLD,  Previously evaluated by Dr. Antoine Poche for palpitations, referred also for ventricular ectopy.  Last heart monitor in 2016 noting frequent PVCs (unsure of burden). Previous history of negative POET (plain old exercise treadmill in 2019.  At his last appointment was reported to have been rare occurrences but was largely active and without symptoms.  He has been on Toprol-XL 25 mg for palpitations.  He sent staff message reporting concerns of atrial fibrillation since he also has a family history of this.  Reports different sensation of palpitations now, very brief short 5 to 10-second episodes generally asymptomatic with very minor lightheadedness.  Not associated with tachycardia.  Also very minor decrease in exercise intolerance reports noticing going up 35 flights of steps whereas before has not had issues with this. Walks 2.5 miles. Reports snoring at night but no daytime somnolence.  Also noted very mild right-sided chest tightness, no interference with ADLs and nonexertional.   ROS: He denies peripheral edema, orthopnea, fatigue, decrease in exercise intolerance, shortness of breath.  Otherwise all other systems negative.  Unless otherwise specified in HPI.   Studies Reviewed: .    TSH and labs from April 2024.  Bilateral,  Risk Assessment/Calculations:         Physical Exam:   VS:  BP 126/70   Pulse 61   Ht 6' (1.829 m)   Wt 226 lb 6.4 oz (102.7 kg)   SpO2 90%   BMI 30.71 kg/m    Wt Readings from Last 3 Encounters:  02/07/24 226 lb 6.4 oz (102.7 kg)  09/21/23 208 lb (94.3 kg)  05/03/23 215 lb 3.2 oz (97.6 kg)    GEN: Well nourished, well developed in no acute distress NECK: No JVD; No carotid  bruits CARDIAC: RRR soft 2 out of 6 murmur right sternal border. RESPIRATORY:  Clear to auscultation without rales, wheezing or rhonchi  ABDOMEN: Soft, non-tender, non-distended EXTREMITIES:  No edema; No deformity   ASSESSMENT AND PLAN: .    Palpitations Known history of frequent PVCs however reports this feeling different but still short bursts that seem likely related to ventricular ectopy however noting very slight difference in exercise intolerance.  He concerned with this given family history. EKG here shows sinus rhythm Normal TSH 10 months ago, repeat Ordering 2-week non live zio. Consider sleep study with snoring.  He will track heart rates at home, Toprol-XL 25 mg.  Murmur Auscultated on exam, previously thought aortic sclerosis.  Will get echocardiogram to verify and look at atria.   Hypertension Well controlled.  Amlodipine 5 mg, Toprol-XL 25 mg  Hyperlipidemia LDL 75 10 months ago. On rosuvastatin 20 mg.       Dispo: 6 weeks to go over heart monitor.  Signed, Abagail Kitchens, PA-C

## 2024-02-07 NOTE — Patient Instructions (Signed)
Medication Instructions:  NO CHANGES *If you need a refill on your cardiac medications before your next appointment, please call your pharmacy*   Lab Work: TSH TODAY If you have labs (blood work) drawn today and your tests are completely normal, you will receive your results only by: MyChart Message (if you have MyChart) OR A paper copy in the mail If you have any lab test that is abnormal or we need to change your treatment, we will call you to review the results.   Testing/Procedures:[address] Your physician has requested that you have an echocardiogram. Echocardiography is a painless test that uses sound waves to create images of your heart. It provides your doctor with information about the size and shape of your heart and how well your heart's chambers and valves are working. This procedure takes approximately one hour. There are no restrictions for this procedure. Please do NOT wear cologne, perfume, aftershave, or lotions (deodorant is allowed). Please arrive 15 minutes prior to your appointment time.  Please note: We ask at that you not bring children with you during ultrasound (echo/ vascular) testing. Due to room size and safety concerns, children are not allowed in the ultrasound rooms during exams. Our front office staff cannot provide observation of children in our lobby area while testing is being conducted. An adult accompanying a patient to their appointment will only be allowed in the ultrasound room at the discretion of the ultrasound technician under special circumstances. We apologize for any inconvenience.    Follow-Up: At [facility], you and your health needs are our priority.  As part of our continuing mission to provide you with exceptional heart care, we have created designated Provider Care Teams.  These Care Teams include your primary Cardiologist (physician) and Advanced Practice Providers (APPs -  Physician Assistants and Nurse  Practitioners) who all work together to provide you with the care you need, when you need it.   Your next appointment:   6 week(s)  Provider:   Azalee Course, PA   Other Instructions ZIO XT- Long Term Monitor Instructions  Your physician has requested you wear a ZIO patch monitor for 14 days.  This is a single patch monitor. [employer] supplies one patch monitor per enrollment. Additional stickers are not available. Please do not apply patch if you will be having a Nuclear Stress Test,  Echocardiogram, Cardiac CT, MRI, or Chest Xray during the period you would be wearing the  monitor. The patch cannot be worn during these tests. You cannot remove and re-apply the  ZIO XT patch monitor.  Your ZIO patch monitor will be mailed 3 day USPS to your address on file. It may take 3-5 days  to receive your monitor after you have been enrolled.  Once you have received your monitor, please review the enclosed instructions. Your monitor  has already been registered assigning a specific monitor serial # to you.  Billing and Patient Assistance Program Information  We have supplied [employer] with any of your insurance information on file for billing purposes. [employer] offers a sliding scale Patient Assistance Program for patients that do not have  insurance, or whose insurance does not completely cover the cost of the ZIO monitor.  You must apply for the Patient Assistance Program to qualify for this discounted rate.  To apply, please call [employer] at [phone], select option 4, select option 2, ask to apply for  Patient Assistance Program. Meredeth Ide will ask your household income, and how  many people  are in your household. They will quote your out-of-pocket cost based on that information.  [employer] will also be able to set up a 37-month, interest-free payment plan if needed.  Applying the monitor   Shave hair from upper left chest.  Hold abrader disc by orange tab. Rub abrader in 40 strokes over the upper  left chest as  indicated in your monitor instructions.  Clean area with 4 enclosed alcohol pads. Let dry.  Apply patch as indicated in monitor instructions. Patch will be placed under collarbone on left  side of chest with arrow pointing upward.  Rub patch adhesive wings for 2 minutes. Remove white label marked "1". Remove the white  label marked "2". Rub patch adhesive wings for 2 additional minutes.  While looking in a mirror, press and release button in center of patch. A small green light will  flash 3-4 times. This will be your only indicator that the monitor has been turned on.  Do not shower for the first 24 hours. You may shower after the first 24 hours.  Press the button if you feel a symptom. You will hear a small click. Record Date, Time and  Symptom in the Patient Logbook.  When you are ready to remove the patch, follow instructions on the last 2 pages of Patient  Logbook. Stick patch monitor onto the last page of Patient Logbook.  Place Patient Logbook in the blue and white box. Use locking tab on box and tape box closed  securely. The blue and white box has prepaid postage on it. Please place it in the mailbox as  soon as possible. Your physician should have your test results approximately 7 days after the  monitor has been mailed back to [facility].  Call [facility] Customer Care at [phone] if you have questions regarding  your ZIO XT patch monitor. Call them immediately if you see an orange light blinking on your  monitor.  If your monitor falls off in less than 4 days, contact our Monitor department at [phone].  If your monitor becomes loose or falls off after 4 days call [employer] at [phone] for  suggestions on securing your monitor

## 2024-03-06 ENCOUNTER — Encounter: Payer: Self-pay | Admitting: *Deleted

## 2024-03-06 ENCOUNTER — Ambulatory Visit (HOSPITAL_COMMUNITY): Payer: Medicare Other | Attending: Cardiology

## 2024-03-06 DIAGNOSIS — I340 Nonrheumatic mitral (valve) insufficiency: Secondary | ICD-10-CM

## 2024-03-06 DIAGNOSIS — R011 Cardiac murmur, unspecified: Secondary | ICD-10-CM | POA: Diagnosis not present

## 2024-03-06 LAB — ECHOCARDIOGRAM COMPLETE
Area-P 1/2: 2.37 cm2
S' Lateral: 2.9 cm

## 2024-03-07 ENCOUNTER — Ambulatory Visit: Attending: Cardiology

## 2024-03-07 NOTE — Progress Notes (Unsigned)
 Order unlinked from 02/07/24 office visit with Yvonna Alanis, PA-C on 03/07/24.  Enrolled for [employer] to mail a ZIO XT long term holter monitor to the patients address on file.   Dr. Antoine Poche to read.

## 2024-03-07 NOTE — Telephone Encounter (Signed)
 Otho Bellows, monitor ordered at 2/17 visit, can you tell me if this was sent to the patient?

## 2024-03-29 DIAGNOSIS — M1712 Unilateral primary osteoarthritis, left knee: Secondary | ICD-10-CM | POA: Diagnosis not present

## 2024-04-07 DIAGNOSIS — R002 Palpitations: Secondary | ICD-10-CM

## 2024-04-10 ENCOUNTER — Encounter: Payer: Self-pay | Admitting: *Deleted

## 2024-04-12 ENCOUNTER — Telehealth: Payer: Self-pay | Admitting: Family Medicine

## 2024-04-12 NOTE — Telephone Encounter (Signed)
 Spoke to pt then called wife , she is supposed to message us  regarding may 20th or may 30th to get the scheduled

## 2024-04-13 ENCOUNTER — Telehealth: Payer: Self-pay

## 2024-04-13 ENCOUNTER — Ambulatory Visit: Payer: Medicare Other

## 2024-04-13 VITALS — BP 126/70 | HR 61 | Ht 73.0 in | Wt 221.0 lb

## 2024-04-13 DIAGNOSIS — Z0000 Encounter for general adult medical examination without abnormal findings: Secondary | ICD-10-CM | POA: Diagnosis not present

## 2024-04-13 NOTE — Telephone Encounter (Signed)
 Duplicate

## 2024-04-13 NOTE — Progress Notes (Signed)
 Subjective:   Marcus Jensen is a 73 y.o. who presents for a Medicare Wellness preventive visit.  Visit Complete: Virtual I connected with  Marcus Jensen on 04/13/24 by a audio enabled telemedicine application and verified that I am speaking with the correct person using two identifiers.  Patient Location: Home  Provider Location: Home Office  I discussed the limitations of evaluation and management by telemedicine. The patient expressed understanding and agreed to proceed.  Vital Signs: Because this visit was a virtual/telehealth visit, some criteria may be missing or patient reported. Any vitals not documented were not able to be obtained and vitals that have been documented are patient reported.  VideoDeclined- This patient declined Librarian, academic. Therefore the visit was completed with audio only.  Persons Participating in Visit: Patient.  AWV Questionnaire: No: Patient Medicare AWV questionnaire was not completed prior to this visit.  Cardiac Risk Factors include: advanced age (>59men, >17 women);male gender;dyslipidemia (Palpitations)     Objective:    Today's Vitals   04/13/24 1446  BP: 126/70  Pulse: 61  Weight: 221 lb (100.2 kg)  Height: 6\' 1"  (1.854 m)  PainSc: 2    Body mass index is 29.16 kg/m.     04/13/2024    2:52 PM 12/11/2022    2:16 PM 12/10/2021    2:12 PM 09/15/2021    8:24 AM 08/19/2016    9:04 AM  Advanced Directives  Does Patient Have a Medical Advance Directive? Yes Yes Yes Yes Yes  Type of Estate agent of [place];Living will Healthcare Power of [place];Living will Healthcare Power of [place];Living will Healthcare Power of [place];Living will Healthcare Power of Attorney  Does patient want to make changes to medical advance directive?    No - Patient declined   Copy of Healthcare Power of Attorney in Chart? No - copy requested No - copy requested No - copy requested No - copy requested  No - copy requested    Current Medications (verified) Outpatient Encounter Medications as of 04/13/2024  Medication Sig   amLODipine  (NORVASC ) 5 MG tablet TAKE 1 TABLET BY MOUTH DAILY   Cholecalciferol (VITAMIN D ) 50 MCG (2000 UT) tablet Take 2,000 Units by mouth in the morning and at bedtime.   famotidine (PEPCID) 10 MG tablet Take 10 mg by mouth daily as needed for heartburn.   gabapentin  (NEURONTIN ) 600 MG tablet Take 1 tablet (600 mg total) by mouth 3 (three) times daily.   glucosamine-chondroitin 500-400 MG tablet Take 2 tablets by mouth 2 (two) times daily.   metoprolol  succinate (TOPROL -XL) 25 MG 24 hr tablet Take 1 tablet (25 mg total) by mouth every morning.   Naproxen Sodium 220 MG CAPS    rosuvastatin  (CRESTOR ) 20 MG tablet Take 1 tablet (20 mg total) by mouth daily.   sildenafil  (VIAGRA ) 100 MG tablet Take 0.5-1 tablets (50-100 mg total) by mouth daily as needed for erectile dysfunction.   ciclopirox  (PENLAC ) 8 % solution Apply topically at bedtime. Apply over nail and surrounding skin. Apply daily over previous coat. After seven (7) days, may remove with alcohol and continue cycle. (Patient not taking: Reported on 04/13/2024)   No facility-administered encounter medications on file as of 04/13/2024.    Allergies (verified) Patient has no known allergies.   History: Past Medical History:  Diagnosis Date   Allergy    environmental   Arthritis    Cervical disc disease   GERD (gastroesophageal reflux disease)    Hyperlipidemia  Hypertension    Peripheral neuropathy    PONV (postoperative nausea and vomiting)    Past Surgical History:  Procedure Laterality Date   ANTERIOR CERVICAL DECOMP/DISCECTOMY FUSION N/A 09/17/2021   Procedure: CERVICAL FOUR-FIVE, CERVICAL FIVE-SIX ANTERIOR CERVICAL DECOMPRESSION/DISCECTOMY FUSION;  Surgeon: Isadora Mar, MD;  Location: [facility] OR;  Service: Neurosurgery;  Laterality: N/A;  3C   carpal tunnel Bilateral 05/2019   Dr Filbert Huff ([facility])   COLONOSCOPY     HAND SURGERY Left    fractured metacarpal 5th digit   HERNIA REPAIR     INGUINAL HERNIA REPAIR Right 08/21/2016   Procedure: RIGHT INGUINAL HERNIORRHAPHY WITH MESH;  Surgeon: Alanda Allegra, MD;  Location: [facility] ORS;  Service: General;  Laterality: Right;   KNEE SURGERY Bilateral    arthroscopy   SKIN BIOPSY     left side nose   Family History  Problem Relation Age of Onset   CVA Mother 40   CVA Father    Cancer Brother        liver   Colon cancer Neg Hx    Colon polyps Neg Hx    Esophageal cancer Neg Hx    Rectal cancer Neg Hx    Stomach cancer Neg Hx    Social History   Socioeconomic History   Marital status: Married    Spouse name: Not on file   Number of children: 2   Years of education: Not on file   Highest education level: Not on file  Occupational History   Occupation: Vet    Comment: retired/part time  Tobacco Use   Smoking status: Former    Current packs/day: 0.00    Average packs/day: 1 pack/day for 6.0 years (6.0 ttl pk-yrs)    Types: Cigarettes    Start date: 02/02/1984    Quit date: 02/01/1990    Years since quitting: 34.2    Passive exposure: Never   Smokeless tobacco: Former    Types: Chew    Quit date: 08/20/1987  Vaping Use   Vaping status: Never Used  Substance and Sexual Activity   Alcohol use: Yes    Alcohol/week: 4.0 standard drinks of alcohol    Types: 2 Glasses of wine, 2 Cans of beer per week   Drug use: No   Sexual activity: Yes    Birth control/protection: None  Other Topics Concern   Not on file  Social History Narrative   Lives with wife   Left Handed   Drinks 4-5 cups caffeine daily   Social Drivers of Health   Financial Resource Strain: Low Risk  (04/13/2024)   Overall Financial Resource Strain (CARDIA)    Difficulty of Paying Living Expenses: Not hard at all  Food Insecurity: No Food Insecurity (04/13/2024)   Hunger Vital Sign    Worried About Running Out of Food in the Last Year: Never true     Ran Out of Food in the Last Year: Never true  Transportation Needs: No Transportation Needs (04/13/2024)   PRAPARE - Administrator, Civil Service (Medical): No    Lack of Transportation (Non-Medical): No  Physical Activity: Patient Declined (04/13/2024)   Exercise Vital Sign    Days of Exercise per Week: Patient declined    Minutes of Exercise per Session: Patient declined  Stress: No Stress Concern Present (04/13/2024)   [employer] of Occupational Health - Occupational Stress Questionnaire    Feeling of Stress : Not at all  Social Connections: Moderately Integrated (04/13/2024)  Social Advertising account executive [NHANES]    Frequency of Communication with Friends and Family: More than three times a week    Frequency of Social Gatherings with Friends and Family: More than three times a week    Attends Religious Services: More than 4 times per year    Active Member of [employer] or Organizations: No    Attends Engineer, structural: Never    Marital Status: Married    Tobacco Counseling Counseling given: Yes    Clinical Intake:  Pre-visit preparation completed: Yes  Pain : 0-10 (broken tooth waiting to be repair/knee pain) Pain Score: 2  Pain Type: Acute pain Pain Onset: 1 to 4 weeks ago Pain Frequency: Rarely     BMI - recorded: 29.16 Nutritional Status: BMI 25 -29 Overweight Nutritional Risks: None Diabetes: No  No results found for: "HGBA1C"   How often do you need to have someone help you when you read instructions, pamphlets, or other written materials from your doctor or pharmacy?: 1 - Never  Interpreter Needed?: No  Information entered by :: Alia t/cma   Activities of Daily Living       04/13/2024    2:51 PM  In your present state of health, do you have any difficulty performing the following activities:  Hearing? 0  Vision? 0  Difficulty concentrating or making decisions? 0  Walking or climbing stairs? 0  Dressing or  bathing? 0  Doing errands, shopping? 0  Preparing Food and eating ? N  Using the Toilet? N  In the past six months, have you accidently leaked urine? N  Do you have problems with loss of bowel control? N  Managing your Medications? N  Managing your Finances? N  Housekeeping or managing your Housekeeping? N    Patient Care Team: Eliodoro Guerin, DO as PCP - General (Family Medicine) Eilleen Grates, MD as PCP - Cardiology (Cardiology) Cloteal Daniels, MD (Orthopedic Surgery)  Indicate any recent Medical Services you may have received from other than [facility] providers in the past year (date may be approximate).     Assessment:   This is a routine wellness examination for Marcus Jensen.  Hearing/Vision screen Hearing Screening - Comments:: Pt denies hearing dif Vision Screening - Comments:: Pt denies dif Pt goes to [facility] in [place]   Goals Addressed             This Visit's Progress    Patient Stated   On track    Stay healthy and active Get pain under control       Depression Screen     04/13/2024    3:17 PM 04/05/2023    9:04 AM 12/11/2022    2:15 PM 12/11/2022    2:14 PM 05/08/2022    9:36 AM 03/09/2022   11:33 AM 12/10/2021    2:11 PM  PHQ 2/9 Scores  PHQ - 2 Score 0 0 0 0 0 0 0  PHQ- 9 Score  0   0 0     Fall Risk     04/13/2024    2:54 PM 09/21/2023    2:07 PM 04/05/2023    9:04 AM 12/11/2022    2:13 PM 05/08/2022    9:37 AM  Fall Risk   Falls in the past year? 0 0 0 0 0  Number falls in past yr: 0 0 0 0   Injury with Fall? 0 0 0 0   Risk for fall due to : No Fall  Risks No Fall Risks No Fall Risks No Fall Risks   Follow up Falls prevention discussed;Falls evaluation completed Falls evaluation completed Education provided Falls prevention discussed     MEDICARE RISK AT HOME:  Medicare Risk at Home Any stairs in or around the home?: Yes If so, are there any without handrails?: Yes Home free of loose throw rugs in walkways, pet beds,  electrical cords, etc?: Yes Adequate lighting in your home to reduce risk of falls?: Yes Life alert?: Yes Use of a cane, walker or w/c?: Yes Grab bars in the bathroom?: No Shower chair or bench in shower?: No Elevated toilet seat or a handicapped toilet?: No  TIMED UP AND GO:  Was the test performed?  no  Cognitive Function: 6CIT completed        04/13/2024    3:00 PM 12/11/2022    2:17 PM 12/10/2021    2:16 PM  6CIT Screen  What Year? 0 points 0 points 0 points  What month? 0 points 0 points 0 points  What time? 0 points 0 points 0 points  Count back from 20 0 points 0 points 0 points  Months in reverse 0 points 0 points 0 points  Repeat phrase 0 points 0 points 0 points  Total Score 0 points 0 points 0 points    Immunizations Immunization History  Administered Date(s) Administered   Fluad Quad(high Dose 65+) 09/21/2019   Influenza, High Dose Seasonal PF 10/06/2018, 10/19/2020   Influenza,inj,Quad PF,6+ Mos 11/08/2013, 11/02/2014, 10/16/2015, 10/03/2016, 10/21/2017   Influenza-Unspecified 09/20/2021   Pneumococcal Conjugate-13 12/16/2016   Pneumococcal Polysaccharide-23 06/20/2018   Rabies, IM 05/26/2014, 05/29/2014, 06/04/2014, 06/11/2014   Tdap 03/09/2022   Zoster, Live 06/11/2015    Screening Tests Health Maintenance  Topic Date Due   Zoster Vaccines- Shingrix (1 of 2) 06/07/1972   COVID-19 Vaccine (1) 04/29/2025 (Originally 06/07/1956)   INFLUENZA VACCINE  07/21/2024   COLON CANCER SCREENING ANNUAL FOBT  08/04/2024   Colonoscopy  01/21/2025   Medicare Annual Wellness (AWV)  04/13/2025   DTaP/Tdap/Td (2 - Td or Tdap) 03/09/2032   Pneumonia Vaccine 16+ Years old  Completed   Hepatitis C Screening  Completed   HPV VACCINES  Aged Out   Meningococcal B Vaccine  Aged Out    Health Maintenance  Health Maintenance Due  Topic Date Due   Zoster Vaccines- Shingrix (1 of 2) 06/07/1972   Health Maintenance Items Addressed: See Nurse Notes  Additional  Screening:  Vision Screening: Recommended annual ophthalmology exams for early detection of glaucoma and other disorders of the eye.  Dental Screening: Recommended annual dental exams for proper oral hygiene  Community Resource Referral / Chronic Care Management: CRR required this visit?  No   CCM required this visit?  No     Plan:     I have personally reviewed and noted the following in the patient's chart:   Medical and social history Use of alcohol, tobacco or illicit drugs  Current medications and supplements including opioid prescriptions. Patient is not currently taking opioid prescriptions. Functional ability and status Nutritional status Physical activity Advanced directives List of other physicians Hospitalizations, surgeries, and ER visits in previous 12 months Vitals Screenings to include cognitive, depression, and falls Referrals and appointments  In addition, I have reviewed and discussed with patient certain preventive protocols, quality metrics, and best practice recommendations. A written personalized care plan for preventive services as well as general preventive health recommendations were provided to patient.     Taijah Macrae  Andy Bannister, [facility]   04/13/2024   After Visit Summary: (MyChart) Due to this being a telephonic visit, the after visit summary with patients personalized plan was offered to patient via MyChart   Notes:  pt is aware he needs his shingles vaccine, pt will get it the next time at [facility]

## 2024-04-13 NOTE — Telephone Encounter (Signed)
 Copied from CRM [id number]. Topic: General - Other >> Apr 13, 2024  3:07 PM Santiya F wrote: Reason for CRM: Patient is calling in because he had his AWV on the phone and while he was having his visit he received a call from the office. Patient doesn't need a call back but wanted to make sure the office wasn't expecting him in person.

## 2024-04-14 ENCOUNTER — Other Ambulatory Visit: Payer: Self-pay | Admitting: Family Medicine

## 2024-04-14 DIAGNOSIS — E7800 Pure hypercholesterolemia, unspecified: Secondary | ICD-10-CM

## 2024-05-04 ENCOUNTER — Ambulatory Visit: Payer: Self-pay

## 2024-05-19 ENCOUNTER — Encounter: Payer: Self-pay | Admitting: Family Medicine

## 2024-05-19 ENCOUNTER — Ambulatory Visit (INDEPENDENT_AMBULATORY_CARE_PROVIDER_SITE_OTHER): Admitting: Family Medicine

## 2024-05-19 VITALS — BP 104/60 | HR 60 | Temp 98.7°F | Ht 73.0 in | Wt 215.0 lb

## 2024-05-19 DIAGNOSIS — E7800 Pure hypercholesterolemia, unspecified: Secondary | ICD-10-CM

## 2024-05-19 DIAGNOSIS — N522 Drug-induced erectile dysfunction: Secondary | ICD-10-CM | POA: Diagnosis not present

## 2024-05-19 DIAGNOSIS — Z23 Encounter for immunization: Secondary | ICD-10-CM

## 2024-05-19 DIAGNOSIS — M4802 Spinal stenosis, cervical region: Secondary | ICD-10-CM | POA: Diagnosis not present

## 2024-05-19 DIAGNOSIS — I10 Essential (primary) hypertension: Secondary | ICD-10-CM | POA: Diagnosis not present

## 2024-05-19 DIAGNOSIS — Z0000 Encounter for general adult medical examination without abnormal findings: Secondary | ICD-10-CM

## 2024-05-19 DIAGNOSIS — G609 Hereditary and idiopathic neuropathy, unspecified: Secondary | ICD-10-CM

## 2024-05-19 DIAGNOSIS — R3911 Hesitancy of micturition: Secondary | ICD-10-CM

## 2024-05-19 DIAGNOSIS — M48061 Spinal stenosis, lumbar region without neurogenic claudication: Secondary | ICD-10-CM | POA: Diagnosis not present

## 2024-05-19 DIAGNOSIS — Z0001 Encounter for general adult medical examination with abnormal findings: Secondary | ICD-10-CM

## 2024-05-19 DIAGNOSIS — K429 Umbilical hernia without obstruction or gangrene: Secondary | ICD-10-CM

## 2024-05-19 DIAGNOSIS — N401 Enlarged prostate with lower urinary tract symptoms: Secondary | ICD-10-CM

## 2024-05-19 MED ORDER — AMLODIPINE BESYLATE 5 MG PO TABS
5.0000 mg | ORAL_TABLET | Freq: Every day | ORAL | 3 refills | Status: AC
Start: 2024-05-19 — End: ?

## 2024-05-19 MED ORDER — GABAPENTIN 600 MG PO TABS: 600.0000 mg | ORAL_TABLET | Freq: Three times a day (TID) | ORAL | 3 refills | Status: AC

## 2024-05-19 MED ORDER — ROSUVASTATIN CALCIUM 20 MG PO TABS: 20.0000 mg | ORAL_TABLET | Freq: Every day | ORAL | 3 refills | Status: AC

## 2024-05-19 MED ORDER — TADALAFIL 20 MG PO TABS: 10.0000 mg | ORAL_TABLET | ORAL | 99 refills | Status: AC | PRN

## 2024-05-19 NOTE — Patient Instructions (Signed)
 Watch BPs.  If stays below 110/60, plan to reduce norvasc  to 1/2 tablet daily or dc totally.

## 2024-05-19 NOTE — Progress Notes (Signed)
 Marcus Jensen is a 73 y.o. male presents to office today for annual physical exam examination.    Concerns today include: 1.  Erectile dysfunction He reports that the Viagra  is not as effective as it had been.  Would like to try something else.  He does note that he still has a hernia that seems to be becoming more persistently uncomfortable so he will be planning to reach out to Dr. Warren Haber to have this surgically corrected  2.  Hypertension Notes that his blood pressure was elevated at his recent dental appointment but he was having a procedure.  He has not been routinely checking it.  He stays physically active and tries to eat a balanced diet.  3.  Left knee pain This is a chronic issue for him.  This is pain that is refractory to previous corticosteroid injection.  He is active as above.  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 Refills needed today: All  Immunization History  Administered Date(s) Administered   Fluad Quad(high Dose 65+) 09/21/2019   Influenza, High Dose Seasonal PF 10/06/2018, 10/19/2020   Influenza,inj,Quad PF,6+ Mos 11/08/2013, 11/02/2014, 10/16/2015, 10/03/2016, 10/21/2017   Influenza-Unspecified 09/20/2021   Pneumococcal Conjugate-13 12/16/2016   Pneumococcal Polysaccharide-23 06/20/2018   Rabies, IM 05/26/2014, 05/29/2014, 06/04/2014, 06/11/2014   Tdap 03/09/2022   Zoster, Live 06/11/2015   Past Medical History:  Diagnosis Date   Allergy    environmental   Arthritis    Cervical disc disease   GERD (gastroesophageal reflux disease)    Hyperlipidemia    Hypertension    Peripheral neuropathy    PONV (postoperative nausea and  vomiting)    Social History   Socioeconomic History   Marital status: Married    Spouse name: Not on file   Number of children: 2   Years of education: Not on file   Highest education level: Not on file  Occupational History   Occupation: Vet    Comment: retired/part time  Tobacco Use   Smoking status: Former    Current packs/day: 0.00    Average packs/day: 1 pack/day for 6.0 years (6.0 ttl pk-yrs)    Types: Cigarettes    Start date: 02/02/1984    Quit date: 02/01/1990    Years since quitting: 34.3    Passive exposure: Never   Smokeless tobacco: Former    Types: Chew    Quit date: 08/20/1987  Vaping Use   Vaping status: Never Used  Substance and Sexual Activity   Alcohol use: Yes    Alcohol/week: 4.0 standard drinks of alcohol    Types: 2 Glasses of wine, 2 Cans of beer per week   Drug use: No   Sexual activity: Yes    Birth control/protection: None  Other Topics Concern   Not on file  Social History Narrative   Lives with wife   Left Handed   Drinks 4-5 cups caffeine daily   Social Drivers of Health   Financial Resource Strain: Low Risk  (04/13/2024)   Overall Financial Resource Strain (CARDIA)    Difficulty of Paying Living Expenses: Not hard at all  Food Insecurity: No Food Insecurity (04/13/2024)   Hunger Vital Sign  Worried About Programme researcher, broadcasting/film/video in the Last Year: Never true    Ran Out of Food in the Last Year: Never true  Transportation Needs: No Transportation Needs (04/13/2024)   PRAPARE - Administrator, Civil Service (Medical): No    Lack of Transportation (Non-Medical): No  Physical Activity: Patient Declined (04/13/2024)   Exercise Vital Sign    Days of Exercise per Week: Patient declined    Minutes of Exercise per Session: Patient declined  Stress: No Stress Concern Present (04/13/2024)   [employer] of Occupational Health - Occupational Stress Questionnaire    Feeling of Stress : Not at all  Social Connections: Moderately  Integrated (04/13/2024)   Social Connection and Isolation Panel [NHANES]    Frequency of Communication with Friends and Family: More than three times a week    Frequency of Social Gatherings with Friends and Family: More than three times a week    Attends Religious Services: More than 4 times per year    Active Member of [employer] or Organizations: No    Attends Banker Meetings: Never    Marital Status: Married  Catering manager Violence: Not At Risk (04/13/2024)   Humiliation, Afraid, Rape, and Kick questionnaire    Fear of Current or Ex-Partner: No    Emotionally Abused: No    Physically Abused: No    Sexually Abused: No   Past Surgical History:  Procedure Laterality Date   ANTERIOR CERVICAL DECOMP/DISCECTOMY FUSION N/A 09/17/2021   Procedure: CERVICAL FOUR-FIVE, CERVICAL FIVE-SIX ANTERIOR CERVICAL DECOMPRESSION/DISCECTOMY FUSION;  Surgeon: Isadora Mar, MD;  Location: [facility] OR;  Service: Neurosurgery;  Laterality: N/A;  3C   carpal tunnel Bilateral 05/2019   Dr Filbert Huff ([facility])   COLONOSCOPY     HAND SURGERY Left    fractured metacarpal 5th digit   HERNIA REPAIR     INGUINAL HERNIA REPAIR Right 08/21/2016   Procedure: RIGHT INGUINAL HERNIORRHAPHY WITH MESH;  Surgeon: Alanda Allegra, MD;  Location: [facility];  Service: General;  Laterality: Right;   KNEE SURGERY Bilateral    arthroscopy   SKIN BIOPSY     left side nose   Family History  Problem Relation Age of Onset   CVA Mother 4   CVA Father    Cancer Brother        liver   Colon cancer Neg Hx    Colon polyps Neg Hx    Esophageal cancer Neg Hx    Rectal cancer Neg Hx    Stomach cancer Neg Hx     Current Outpatient Medications:    Cholecalciferol (VITAMIN D ) 50 MCG (2000 UT) tablet, Take 2,000 Units by mouth in the morning and at bedtime., Disp: , Rfl:    famotidine (PEPCID) 10 MG tablet, Take 10 mg by mouth daily as needed for heartburn., Disp: , Rfl:    glucosamine-chondroitin 500-400 MG  tablet, Take 2 tablets by mouth 2 (two) times daily., Disp: , Rfl:    metoprolol  succinate (TOPROL -XL) 25 MG 24 hr tablet, Take 1 tablet (25 mg total) by mouth every morning., Disp: 100 tablet, Rfl: 3   Naproxen Sodium 220 MG CAPS, , Disp: , Rfl:    tadalafil (CIALIS) 20 MG tablet, Take 0.5-1 tablets (10-20 mg total) by mouth every other day as needed for erectile dysfunction., Disp: 30 tablet, Rfl: PRN   amLODipine  (NORVASC ) 5 MG tablet, Take 1 tablet (5 mg total) by mouth daily., Disp: 100 tablet, Rfl: 3  gabapentin  (NEURONTIN ) 600 MG tablet, Take 1 tablet (600 mg total) by mouth 3 (three) times daily., Disp: 300 tablet, Rfl: 3   rosuvastatin  (CRESTOR ) 20 MG tablet, Take 1 tablet (20 mg total) by mouth daily., Disp: 100 tablet, Rfl: 3  No Known Allergies   ROS: Review of Systems Pertinent items noted in HPI and remainder of comprehensive ROS otherwise negative.    Physical exam BP 104/60   Pulse 60   Temp 98.7 F (37.1 C)   Ht 6\' 1"  (1.854 m)   Wt 215 lb (97.5 kg)   SpO2 96%   BMI 28.37 kg/m  General appearance: alert, cooperative, appears stated age, and no distress Head: Normocephalic, without obvious abnormality, atraumatic Eyes: negative findings: lids and lashes normal, conjunctivae and sclerae normal, corneas clear, and pupils equal, round, reactive to light and accomodation Ears: normal TM's and external ear canals both ears Nose: Nares normal. Septum midline. Mucosa normal. No drainage or sinus tenderness. Throat: lips, mucosa, and tongue normal; teeth and gums normal Neck: no adenopathy, no carotid bruit, supple, symmetrical, trachea midline, and thyroid  not enlarged, symmetric, no tenderness/mass/nodules Back: symmetric, no curvature. ROM normal. No CVA tenderness. Lungs: clear to auscultation bilaterally Chest wall: no tenderness Heart: Regular rate and rhythm with S1, S2 heard.  He has a soft systolic murmur that does not radiate to the carotids appreciated at the  right sternal border Abdomen: soft, non-tender; bowel sounds normal; no masses,  no organomegaly Extremities: extremities normal, atraumatic, no cyanosis or edema Pulses: 2+ and symmetric Skin: Skin color, texture, turgor normal. No rashes or lesions Lymph nodes: Cervical, supraclavicular, and axillary nodes normal. Neurologic: Grossly normal      05/19/2024    3:51 PM 04/13/2024    3:17 PM 04/05/2023    9:04 AM  Depression screen PHQ 2/9  Decreased Interest 0 0 0  Down, Depressed, Hopeless 0 0 0  PHQ - 2 Score 0 0 0  Altered sleeping 0  0  Tired, decreased energy 0  0  Change in appetite 0  0  Feeling bad or failure about yourself  0  0  Trouble concentrating 0  0  Moving slowly or fidgety/restless 0  0  Suicidal thoughts 0  0  PHQ-9 Score 0  0  Difficult doing work/chores Not difficult at all  Not difficult at all      05/19/2024    3:51 PM 04/05/2023    9:04 AM 05/08/2022    9:36 AM 03/09/2022   11:34 AM  GAD 7 : Generalized Anxiety Score  Nervous, Anxious, on Edge 0 0 0 0  Control/stop worrying 0 0 0 0  Worry too much - different things 0 0 0 0  Trouble relaxing 0 0 0 0  Restless 0 0 0 0  Easily annoyed or irritable 0 0 0 0  Afraid - awful might happen 0 0 0 0  Total GAD 7 Score 0 0 0 0  Anxiety Difficulty Not difficult at all Not difficult at all Not difficult at all Not difficult at all     Assessment/ Plan: Lyndy Santos here for annual physical exam.   Annual physical exam  Drug-induced erectile dysfunction - Plan: tadalafil (CIALIS) 20 MG tablet  Essential hypertension - Plan: amLODipine  (NORVASC ) 5 MG tablet, CMP14+EGFR  Pure hypercholesterolemia - Plan: rosuvastatin  (CRESTOR ) 20 MG tablet, Lipid panel, CMP14+EGFR, TSH  Spinal stenosis of cervical region - Plan: gabapentin  (NEURONTIN ) 600 MG tablet  Degenerative lumbar spinal stenosis -  Plan: gabapentin  (NEURONTIN ) 600 MG tablet  Idiopathic peripheral neuropathy - Plan: gabapentin  (NEURONTIN ) 600 MG  tablet, CBC  Benign prostatic hyperplasia with urinary hesitancy - Plan: PSA, Urinalysis, Routine w reflex microscopic  Umbilical hernia without obstruction and without gangrene  He will get his first shingles vaccination today and understands that he should schedule his second 1 in 4 to 6 months.  Fasting labs ordered and he will return at a later date and time for this.  All chronic issues are stable and medications have been renewed.  I did go ahead and switch out his Viagra  for Cialis  His BP is on the soft side. Watch BP at home. May be able to dc Norvasc .  Schedule OV with Larrie Po for hernia.  Counseled on healthy lifestyle choices, including diet (rich in fruits, vegetables and lean meats and low in salt and simple carbohydrates) and exercise (at least 30 minutes of moderate physical activity daily).  Patient to follow up 1 year CPE  Shamarcus Hoheisel M. Bonnell Butcher, DO

## 2024-05-26 NOTE — Addendum Note (Signed)
 Addended by: Ma Saupe L on: 05/26/2024 11:11 AM   Modules accepted: Orders

## 2024-05-29 DIAGNOSIS — S92512A Displaced fracture of proximal phalanx of left lesser toe(s), initial encounter for closed fracture: Secondary | ICD-10-CM | POA: Diagnosis not present

## 2024-06-06 DIAGNOSIS — S92512D Displaced fracture of proximal phalanx of left lesser toe(s), subsequent encounter for fracture with routine healing: Secondary | ICD-10-CM | POA: Diagnosis not present

## 2024-06-08 ENCOUNTER — Other Ambulatory Visit

## 2024-06-08 DIAGNOSIS — I10 Essential (primary) hypertension: Secondary | ICD-10-CM | POA: Diagnosis not present

## 2024-06-08 DIAGNOSIS — E7800 Pure hypercholesterolemia, unspecified: Secondary | ICD-10-CM | POA: Diagnosis not present

## 2024-06-08 DIAGNOSIS — N401 Enlarged prostate with lower urinary tract symptoms: Secondary | ICD-10-CM

## 2024-06-08 DIAGNOSIS — R3911 Hesitancy of micturition: Secondary | ICD-10-CM | POA: Diagnosis not present

## 2024-06-08 DIAGNOSIS — G609 Hereditary and idiopathic neuropathy, unspecified: Secondary | ICD-10-CM

## 2024-06-08 LAB — URINALYSIS, ROUTINE W REFLEX MICROSCOPIC
Bilirubin, UA: NEGATIVE
Glucose, UA: NEGATIVE
Ketones, UA: NEGATIVE
Leukocytes,UA: NEGATIVE
Nitrite, UA: NEGATIVE
Protein,UA: NEGATIVE
RBC, UA: NEGATIVE
Specific Gravity, UA: 1.015 (ref 1.005–1.030)
Urobilinogen, Ur: 0.2 mg/dL (ref 0.2–1.0)
pH, UA: 7 (ref 5.0–7.5)

## 2024-06-09 ENCOUNTER — Ambulatory Visit: Payer: Self-pay | Admitting: Family Medicine

## 2024-06-09 ENCOUNTER — Encounter: Payer: Self-pay | Admitting: Family Medicine

## 2024-06-09 LAB — CMP14+EGFR
ALT: 23 IU/L (ref 0–44)
AST: 27 IU/L (ref 0–40)
Albumin: 4.4 g/dL (ref 3.8–4.8)
Alkaline Phosphatase: 87 IU/L (ref 44–121)
BUN/Creatinine Ratio: 20 (ref 10–24)
BUN: 22 mg/dL (ref 8–27)
Bilirubin Total: 0.6 mg/dL (ref 0.0–1.2)
CO2: 22 mmol/L (ref 20–29)
Calcium: 9.2 mg/dL (ref 8.6–10.2)
Chloride: 101 mmol/L (ref 96–106)
Creatinine, Ser: 1.09 mg/dL (ref 0.76–1.27)
Globulin, Total: 2.1 g/dL (ref 1.5–4.5)
Glucose: 97 mg/dL (ref 70–99)
Potassium: 4.5 mmol/L (ref 3.5–5.2)
Sodium: 137 mmol/L (ref 134–144)
Total Protein: 6.5 g/dL (ref 6.0–8.5)
eGFR: 73 mL/min/{1.73_m2} (ref >59)

## 2024-06-09 LAB — CBC
Hematocrit: 42.1 % (ref 37.5–51.0)
Hemoglobin: 13.8 g/dL (ref 13.0–17.7)
MCH: 31.4 pg (ref 26.6–33.0)
MCHC: 32.8 g/dL (ref 31.5–35.7)
MCV: 96 fL (ref 79–97)
Platelets: 201 10*3/uL (ref 150–450)
RBC: 4.4 x10E6/uL (ref 4.14–5.80)
RDW: 12.2 % (ref 11.6–15.4)
WBC: 4.1 10*3/uL (ref 3.4–10.8)

## 2024-06-09 LAB — LIPID PANEL
Chol/HDL Ratio: 2.6 ratio (ref 0.0–5.0)
Cholesterol, Total: 153 mg/dL (ref 100–199)
HDL: 60 mg/dL (ref >39)
LDL Chol Calc (NIH): 84 mg/dL (ref 0–99)
Triglycerides: 42 mg/dL (ref 0–149)
VLDL Cholesterol Cal: 9 mg/dL (ref 5–40)

## 2024-06-09 LAB — PSA: Prostate Specific Ag, Serum: 2.8 ng/mL (ref 0.0–4.0)

## 2024-06-09 LAB — TSH: TSH: 0.452 u[IU]/mL (ref 0.450–4.500)

## 2024-06-12 ENCOUNTER — Ambulatory Visit: Payer: Medicare Other

## 2024-06-29 ENCOUNTER — Telehealth: Payer: Self-pay | Admitting: *Deleted

## 2024-06-29 DIAGNOSIS — K4090 Unilateral inguinal hernia, without obstruction or gangrene, not specified as recurrent: Secondary | ICD-10-CM

## 2024-06-29 NOTE — Telephone Encounter (Signed)
 Received call from patient [phone]~ telephone.   Patient requested to proceed with surgery scheduling for the week of 07/17/2024.  Advised that patient will require OV to update documentation.   Appointment scheduled.

## 2024-07-06 ENCOUNTER — Encounter: Payer: Self-pay | Admitting: General Surgery

## 2024-07-06 ENCOUNTER — Ambulatory Visit: Admitting: General Surgery

## 2024-07-06 VITALS — BP 132/67 | HR 56 | Temp 98.3°F | Resp 14 | Ht 73.0 in | Wt 218.0 lb

## 2024-07-06 DIAGNOSIS — S92512D Displaced fracture of proximal phalanx of left lesser toe(s), subsequent encounter for fracture with routine healing: Secondary | ICD-10-CM | POA: Diagnosis not present

## 2024-07-06 DIAGNOSIS — K4090 Unilateral inguinal hernia, without obstruction or gangrene, not specified as recurrent: Secondary | ICD-10-CM | POA: Diagnosis not present

## 2024-07-06 NOTE — H&P (Signed)
 LEGACY LACIVITA; [id number]; [birth year]   HPI Patient is a 73 year old white male who returns back to my care for a known left inguinal hernia.  He states that recently it has become more symptomatic and he balls up a T-shirt to keep the hernia in.  It is made worse with straining. Past Medical History:  Diagnosis Date   Allergy    environmental   Arthritis    Cervical disc disease   GERD (gastroesophageal reflux disease)    Hyperlipidemia    Hypertension    Peripheral neuropathy    PONV (postoperative nausea and vomiting)     Past Surgical History:  Procedure Laterality Date   ANTERIOR CERVICAL DECOMP/DISCECTOMY FUSION N/A 09/17/2021   Procedure: CERVICAL FOUR-FIVE, CERVICAL FIVE-SIX ANTERIOR CERVICAL DECOMPRESSION/DISCECTOMY FUSION;  Surgeon: Joshua Alm RAMAN, MD;  Location: [facility] OR;  Service: Neurosurgery;  Laterality: N/A;  3C   carpal tunnel Bilateral 05/2019   Dr Jackye ([facility])   COLONOSCOPY     HAND SURGERY Left    fractured metacarpal 5th digit   HERNIA REPAIR     INGUINAL HERNIA REPAIR Right 08/21/2016   Procedure: RIGHT INGUINAL HERNIORRHAPHY WITH MESH;  Surgeon: Oneil Budge, MD;  Location: [facility] ORS;  Service: General;  Laterality: Right;   KNEE SURGERY Bilateral    arthroscopy   SKIN BIOPSY     left side nose    Family History  Problem Relation Age of Onset   CVA Mother 78   CVA Father    Cancer Brother        liver   Colon cancer Neg Hx    Colon polyps Neg Hx    Esophageal cancer Neg Hx    Rectal cancer Neg Hx    Stomach cancer Neg Hx     Current Outpatient Medications on File Prior to Visit  Medication Sig Dispense Refill   amLODipine  (NORVASC ) 5 MG tablet Take 1 tablet (5 mg total) by mouth daily. 100 tablet 3   Cholecalciferol (VITAMIN D ) 50 MCG (2000 UT) tablet Take 2,000 Units by mouth in the morning and at bedtime.     famotidine (PEPCID) 10 MG tablet Take 10 mg by mouth daily as needed for heartburn.     gabapentin  (NEURONTIN ) 600  MG tablet Take 1 tablet (600 mg total) by mouth 3 (three) times daily. 300 tablet 3   glucosamine-chondroitin 500-400 MG tablet Take 2 tablets by mouth 2 (two) times daily.     metoprolol  succinate (TOPROL -XL) 25 MG 24 hr tablet Take 1 tablet (25 mg total) by mouth every morning. 100 tablet 3   Naproxen Sodium 220 MG CAPS      rosuvastatin  (CRESTOR ) 20 MG tablet Take 1 tablet (20 mg total) by mouth daily. 100 tablet 3   tadalafil  (CIALIS ) 20 MG tablet Take 0.5-1 tablets (10-20 mg total) by mouth every other day as needed for erectile dysfunction. 30 tablet PRN   No current facility-administered medications on file prior to visit.    No Known Allergies  Social History   Substance and Sexual Activity  Alcohol Use Yes   Alcohol/week: 4.0 standard drinks of alcohol   Types: 2 Glasses of wine, 2 Cans of beer per week    Social History   Tobacco Use  Smoking Status Former   Current packs/day: 0.00   Average packs/day: 1 pack/day for 6.0 years (6.0 ttl pk-yrs)   Types: Cigarettes   Start date: 02/02/1984   Quit date: 02/01/1990   Years since quitting:  34.4   Passive exposure: Never  Smokeless Tobacco Former   Types: Chew   Quit date: 08/20/1987    Review of Systems  Constitutional: Negative.   HENT: Negative.    Eyes: Negative.   Respiratory: Negative.    Cardiovascular: Negative.   Gastrointestinal:  Positive for heartburn.  Genitourinary:  Positive for frequency and urgency.  Musculoskeletal:  Positive for back pain and neck pain.  Skin: Negative.   Neurological:  Positive for sensory change.  Endo/Heme/Allergies: Negative.   Psychiatric/Behavioral: Negative.      Objective   Vitals:   07/06/24 0839  BP: 132/67  Pulse: (!) 56  Resp: 14  Temp: 98.3 F (36.8 C)  SpO2: 96%    Physical Exam Vitals reviewed.  Constitutional:      Appearance: Normal appearance. He is normal weight. He is not ill-appearing.  HENT:     Head: Normocephalic and atraumatic.   Cardiovascular:     Rate and Rhythm: Normal rate and regular rhythm.     Heart sounds: Normal heart sounds. No murmur heard.    No friction rub. No gallop.  Pulmonary:     Effort: Pulmonary effort is normal. No respiratory distress.     Breath sounds: Normal breath sounds. No stridor. No wheezing, rhonchi or rales.  Abdominal:     General: Bowel sounds are normal. There is no distension.     Palpations: Abdomen is soft. There is no mass.     Tenderness: There is no abdominal tenderness. There is no guarding or rebound.     Hernia: A hernia is present.     Comments: Easily reducible left inguinal hernia.  Skin:    General: Skin is warm and dry.  Neurological:     Mental Status: He is alert and oriented to person, place, and time.    Previous office notes reviewed Assessment  Left inguinal hernia, symptomatic Plan  Patient is scheduled for a robotic assisted laparoscopic left inguinal herniorrhaphy with mesh on 07/17/2024.  The risks and benefits of the procedure including bleeding, infection, mesh use, and the possibility of recurrence of the hernia were fully explained to the patient, who gave informed consent.

## 2024-07-06 NOTE — Progress Notes (Signed)
 Marcus Jensen; [id number]; [birth year]   HPI Patient is a 73 year old white male who returns back to my care for a known left inguinal hernia.  He states that recently it has become more symptomatic and he balls up a T-shirt to keep the hernia in.  It is made worse with straining. Past Medical History:  Diagnosis Date   Allergy    environmental   Arthritis    Cervical disc disease   GERD (gastroesophageal reflux disease)    Hyperlipidemia    Hypertension    Peripheral neuropathy    PONV (postoperative nausea and vomiting)     Past Surgical History:  Procedure Laterality Date   ANTERIOR CERVICAL DECOMP/DISCECTOMY FUSION N/A 09/17/2021   Procedure: CERVICAL FOUR-FIVE, CERVICAL FIVE-SIX ANTERIOR CERVICAL DECOMPRESSION/DISCECTOMY FUSION;  Surgeon: Joshua Alm RAMAN, MD;  Location: [facility] OR;  Service: Neurosurgery;  Laterality: N/A;  3C   carpal tunnel Bilateral 05/2019   Dr Jackye ([facility])   COLONOSCOPY     HAND SURGERY Left    fractured metacarpal 5th digit   HERNIA REPAIR     INGUINAL HERNIA REPAIR Right 08/21/2016   Procedure: RIGHT INGUINAL HERNIORRHAPHY WITH MESH;  Surgeon: Oneil Budge, MD;  Location: [facility];  Service: General;  Laterality: Right;   KNEE SURGERY Bilateral    arthroscopy   SKIN BIOPSY     left side nose    Family History  Problem Relation Age of Onset   CVA Mother 78   CVA Father    Cancer Brother        liver   Colon cancer Neg Hx    Colon polyps Neg Hx    Esophageal cancer Neg Hx    Rectal cancer Neg Hx    Stomach cancer Neg Hx     Current Outpatient Medications on File Prior to Visit  Medication Sig Dispense Refill   amLODipine  (NORVASC ) 5 MG tablet Take 1 tablet (5 mg total) by mouth daily. 100 tablet 3   Cholecalciferol (VITAMIN D ) 50 MCG (2000 UT) tablet Take 2,000 Units by mouth in the morning and at bedtime.     famotidine (PEPCID) 10 MG tablet Take 10 mg by mouth daily as needed for heartburn.     gabapentin  (NEURONTIN ) 600  MG tablet Take 1 tablet (600 mg total) by mouth 3 (three) times daily. 300 tablet 3   glucosamine-chondroitin 500-400 MG tablet Take 2 tablets by mouth 2 (two) times daily.     metoprolol  succinate (TOPROL -XL) 25 MG 24 hr tablet Take 1 tablet (25 mg total) by mouth every morning. 100 tablet 3   Naproxen Sodium 220 MG CAPS      rosuvastatin  (CRESTOR ) 20 MG tablet Take 1 tablet (20 mg total) by mouth daily. 100 tablet 3   tadalafil  (CIALIS ) 20 MG tablet Take 0.5-1 tablets (10-20 mg total) by mouth every other day as needed for erectile dysfunction. 30 tablet PRN   No current facility-administered medications on file prior to visit.    No Known Allergies  Social History   Substance and Sexual Activity  Alcohol Use Yes   Alcohol/week: 4.0 standard drinks of alcohol   Types: 2 Glasses of wine, 2 Cans of beer per week    Social History   Tobacco Use  Smoking Status Former   Current packs/day: 0.00   Average packs/day: 1 pack/day for 6.0 years (6.0 ttl pk-yrs)   Types: Cigarettes   Start date: 02/02/1984   Quit date: 02/01/1990   Years since quitting:  34.4   Passive exposure: Never  Smokeless Tobacco Former   Types: Chew   Quit date: 08/20/1987    Review of Systems  Constitutional: Negative.   HENT: Negative.    Eyes: Negative.   Respiratory: Negative.    Cardiovascular: Negative.   Gastrointestinal:  Positive for heartburn.  Genitourinary:  Positive for frequency and urgency.  Musculoskeletal:  Positive for back pain and neck pain.  Skin: Negative.   Neurological:  Positive for sensory change.  Endo/Heme/Allergies: Negative.   Psychiatric/Behavioral: Negative.      Objective   Vitals:   07/06/24 0839  BP: 132/67  Pulse: (!) 56  Resp: 14  Temp: 98.3 F (36.8 C)  SpO2: 96%    Physical Exam Vitals reviewed.  Constitutional:      Appearance: Normal appearance. He is normal weight. He is not ill-appearing.  HENT:     Head: Normocephalic and atraumatic.   Cardiovascular:     Rate and Rhythm: Normal rate and regular rhythm.     Heart sounds: Normal heart sounds. No murmur heard.    No friction rub. No gallop.  Pulmonary:     Effort: Pulmonary effort is normal. No respiratory distress.     Breath sounds: Normal breath sounds. No stridor. No wheezing, rhonchi or rales.  Abdominal:     General: Bowel sounds are normal. There is no distension.     Palpations: Abdomen is soft. There is no mass.     Tenderness: There is no abdominal tenderness. There is no guarding or rebound.     Hernia: A hernia is present.     Comments: Easily reducible left inguinal hernia.  Skin:    General: Skin is warm and dry.  Neurological:     Mental Status: He is alert and oriented to person, place, and time.    Previous office notes reviewed Assessment  Left inguinal hernia, symptomatic Plan  Patient is scheduled for a robotic assisted laparoscopic left inguinal herniorrhaphy with mesh on 07/17/2024.  The risks and benefits of the procedure including bleeding, infection, mesh use, and the possibility of recurrence of the hernia were fully explained to the patient, who gave informed consent.

## 2024-07-07 NOTE — Patient Instructions (Signed)
 Marcus Jensen  07/07/2024     @PREFPERIOPPHARMACY @   Your procedure is scheduled on  07/17/2024.   Report to [facility] at  1045 A.M.   Call this number if you have problems the morning of surgery:  [phone]  If you experience any cold or flu symptoms such as cough, fever, chills, shortness of breath, etc. between now and your scheduled surgery, please notify us  at the above number.   Remember:  Do not eat after midnight.   You may drink clear liquids until  0845 am on 07/17/2024.    Clear liquids allowed are:                    Water, Juice (No red color; non-citric and without pulp; diabetics please choose diet or no sugar options), Carbonated beverages (diabetics please choose diet or no sugar options), Clear Tea (No creamer, milk, or cream, including half & half and powdered creamer), Black Coffee Only (No creamer, milk or cream, including half & half and powdered creamer), and Clear Sports drink (No red color; diabetics please choose diet or no sugar options)    Take these medicines the morning of surgery with A SIP OF WATER              amlodipine , famotidine, gabapentin , metoprolol .    Do not wear jewelry, make-up or nail polish, including gel polish,  artificial nails, or any other type of covering on natural nails (fingers and  toes).  Do not wear lotions, powders, or perfumes, or deodorant.  Do not shave 48 hours prior to surgery.  Men may shave face and neck.  Do not bring valuables to the hospital.  [facility] is not responsible for any belongings or valuables.  Contacts, dentures or bridgework may not be worn into surgery.  Leave your suitcase in the car.  After surgery it may be brought to your room.  For patients admitted to the hospital, discharge time will be determined by your treatment team.  Patients discharged the day of surgery will not be allowed to drive home and must have someone with them for 24 hours.    Special instructions:   DO NOT  smoke tobacco or vape for 24 hours before your procedure.  Please read over the following fact sheets that you were given. Coughing and Deep Breathing, Surgical Site Infection Prevention, Anesthesia Post-op Instructions, and Care and Recovery After Surgery       Laparoscopic Surgery for Groin Hernia in Adults: What to Know After After a laparoscopic surgery for groin hernia, it's common to have pain, discomfort, soreness, swelling, and bruising around the cuts that were made in the belly. There may also be swelling of the scrotum in males. Follow these instructions at home: Activity Rest as told. Get up and take short walks many times during the day. This helps you breathe better and keeps your blood flowing. Ask for help if you feel weak or unsteady. Ask if it's OK for you to lift. Do not take baths, swim, or use a hot tub until you're told it's OK. Ask if you can shower. Ask what things are safe for you to do at home. Ask when you can go back to work or school. Medicines Take your medicines only as told. You may need to take steps to help treat or prevent trouble pooping (constipation), such as: Taking medicine to help you poop. Eating foods high in fiber, like beans, whole  grains, and fresh fruits and vegetables. Drinking more fluids as told. Ask your health care provider if it's safe to drive or use machines while taking your medicine. Wound care  Take care of the cuts in your belly as told. Make sure you: Wash your hands with soap and water for at least 20 seconds before and after you change your bandage. If you can't use soap and water, use hand sanitizer. Change your bandage. Leave stitches or skin glue alone. Leave tape strips alone unless you're told to take them off. You may trim the edges of the tape strips if they curl up. Check the cuts on your belly every day for signs of infection. Check for: More redness, swelling, or pain. More fluid or blood. Warmth. Pus or a bad  smell. Pain management  Use ice or an ice pack as told. Place a towel between your skin and the ice. Leave the ice on for 20 minutes, 2-3 times a day. If your skin turns red, take off the ice right away to prevent skin damage. The risk of damage is higher if you can't feel pain, heat, or cold. General instructions Do not smoke, vape, or use nicotine or tobacco. Doing this can slow healing. Wear compression stockings to reduce swelling and help prevent blood clots in your legs. You may be asked to continue to do deep breathing exercises at home. This will help to prevent a lung infection. Your provider may give you more instructions. Make sure you know what you can and can't do. Contact a health care provider if: You have any signs of infection. You have more swelling or pain in your scrotum. You have pain that gets worse or doesn't get better with medicine. You aren't able to pee. You haven't pooped in 3 days. You have a fever. You throw up or you feel like throwing up. Get help right away if: You have redness, warmth, or pain in your leg. You have chest pain. You have trouble breathing. You have very bad pain in your belly. You throw up each time you eat or drink. These symptoms may be an emergency. Call 911 right away. Do not wait to see if the symptoms will go away. Do not drive yourself to the hospital. This information is not intended to replace advice given to you by your health care provider. Make sure you discuss any questions you have with your health care provider. Document Revised: 09/21/2023 Document Reviewed: 09/21/2023 Elsevier Patient Education  2025 Else[employer] Inc.General Anesthesia, Adult, Care After The following information offers guidance on how to care for yourself after your procedure. Your health care provider may also give you more specific instructions. If you have problems or questions, contact your health care provider. What can I expect after the  procedure? After the procedure, it is common for people to: Have pain or discomfort at the IV site. Have nausea or vomiting. Have a sore throat or hoarseness. Have trouble concentrating. Feel cold or chills. Feel weak, sleepy, or tired (fatigue). Have soreness and body aches. These can affect parts of the body that were not involved in surgery. Follow these instructions at home: For the time period you were told by your health care provider:  Rest. Do not participate in activities where you could fall or become injured. Do not drive or use machinery. Do not drink alcohol. Do not take sleeping pills or medicines that cause drowsiness. Do not make important decisions or sign legal documents. Do not take care  of children on your own. General instructions Drink enough fluid to keep your urine pale yellow. If you have sleep apnea, surgery and certain medicines can increase your risk for breathing problems. Follow instructions from your health care provider about wearing your sleep device: Anytime you are sleeping, including during daytime naps. While taking prescription pain medicines, sleeping medicines, or medicines that make you drowsy. Return to your normal activities as told by your health care provider. Ask your health care provider what activities are safe for you. Take over-the-counter and prescription medicines only as told by your health care provider. Do not use any products that contain nicotine or tobacco. These products include cigarettes, chewing tobacco, and vaping devices, such as e-cigarettes. These can delay incision healing after surgery. If you need help quitting, ask your health care provider. Contact a health care provider if: You have nausea or vomiting that does not get better with medicine. You vomit every time you eat or drink. You have pain that does not get better with medicine. You cannot urinate or have bloody urine. You develop a skin rash. You have a  fever. Get help right away if: You have trouble breathing. You have chest pain. You vomit blood. These symptoms may be an emergency. Get help right away. Call 911. Do not wait to see if the symptoms will go away. Do not drive yourself to the hospital. Summary After the procedure, it is common to have a sore throat, hoarseness, nausea, vomiting, or to feel weak, sleepy, or fatigue. For the time period you were told by your health care provider, do not drive or use machinery. Get help right away if you have difficulty breathing, have chest pain, or vomit blood. These symptoms may be an emergency. This information is not intended to replace advice given to you by your health care provider. Make sure you discuss any questions you have with your health care provider. Document Revised: 03/06/2022 Document Reviewed: 03/06/2022 Elsevier Patient Education  2024 Elsevier Inc. How to Use Chlorhexidine  at Home in the Shower Chlorhexidine  gluconate (CHG) is a germ-killing (antiseptic) wash that's used to clean the skin. It can get rid of the germs that normally live on the skin and can keep them away for about 24 hours. If you're having surgery, you may be told to shower with CHG at home the night before surgery. This can help lower your risk for infection. To use CHG wash in the shower, follow the steps below. Supplies needed: CHG body wash. Clean washcloth. Clean towel. How to use CHG in the shower Follow these steps unless you're told to use CHG in a different way: Start the shower. Use your normal soap and shampoo to wash your face and hair. Turn off the shower or move out of the shower stream. Pour CHG onto a clean washcloth. Do not use any type of brush or rough sponge. Start at your neck, washing your body down to your toes. Make sure you: Wash the part of your body where the surgery will be done for at least 1 minute. Do not scrub. Do not use CHG on your head or face unless your health  care provider tells you to. If it gets into your ears or eyes, rinse them well with water. Do not wash your genitals with CHG. Wash your back and under your arms. Make sure to wash skin folds. Let the CHG sit on your skin for 1-2 minutes or as long as told. Rinse your entire body in  the shower, including all body creases and folds. Turn off the shower. Dry off with a clean towel. Do not put anything on your skin afterward, such as powder, lotion, or perfume. Put on clean clothes or pajamas. If it's the night before surgery, sleep in clean sheets. General tips Use CHG only as told, and follow the instructions on the label. Use the full amount of CHG as told. This is often one bottle. Do not smoke and stay away from flames after using CHG. Your skin may feel sticky after using CHG. This is normal. The sticky feeling will go away as the CHG dries. Do not use CHG: If you have a chlorhexidine  allergy or have reacted to chlorhexidine  in the past. On open wounds or areas of skin that have broken skin, cuts, or scrapes. On babies younger than 49 months of age. Contact a health care provider if: You have questions about using CHG. Your skin gets irritated or itchy. You have a rash after using CHG. You swallow any CHG. Call your local poison control center [phone] in the [place] Your eyes itch badly, or they become very red or swollen. Your hearing changes. You have trouble seeing. If you can't reach your provider, go to an urgent care or emergency room. Do not drive yourself. Get help right away if: You have swelling or tingling in your mouth or throat. You make high-pitched whistling sounds when you breathe, most often when you breathe out (wheeze). You have trouble breathing. These symptoms may be an emergency. Call 911 right away. Do not wait to see if the symptoms will go away. Do not drive yourself to the hospital. This information is not intended to replace advice given to you by  your health care provider. Make sure you discuss any questions you have with your health care provider. Document Revised: 06/22/2023 Document Reviewed: 06/18/2022 Elsevier Patient Education  2024 [employer].

## 2024-07-10 ENCOUNTER — Encounter (HOSPITAL_COMMUNITY)
Admission: RE | Admit: 2024-07-10 | Discharge: 2024-07-10 | Disposition: A | Discharge status: Home / Self Care | Source: Ambulatory Visit | Attending: General Surgery | Admitting: General Surgery

## 2024-07-10 ENCOUNTER — Encounter (HOSPITAL_COMMUNITY): Payer: Self-pay

## 2024-07-10 DIAGNOSIS — Z01818 Encounter for other preprocedural examination: Secondary | ICD-10-CM | POA: Diagnosis not present

## 2024-07-10 HISTORY — DX: Ventricular premature depolarization: I49.3

## 2024-07-13 ENCOUNTER — Encounter (HOSPITAL_COMMUNITY)

## 2024-07-17 ENCOUNTER — Ambulatory Visit (HOSPITAL_COMMUNITY): Payer: Self-pay | Admitting: Anesthesiology

## 2024-07-17 ENCOUNTER — Ambulatory Visit (HOSPITAL_COMMUNITY)
Admission: RE | Admit: 2024-07-17 | Discharge: 2024-07-17 | Disposition: A | Discharge status: Home / Self Care | Attending: General Surgery | Admitting: General Surgery

## 2024-07-17 ENCOUNTER — Encounter (HOSPITAL_COMMUNITY)
Admission: RE | Disposition: A | Discharge status: Home / Self Care | Payer: Self-pay | Source: Home / Self Care | Attending: General Surgery

## 2024-07-17 DIAGNOSIS — K4090 Unilateral inguinal hernia, without obstruction or gangrene, not specified as recurrent: Secondary | ICD-10-CM

## 2024-07-17 DIAGNOSIS — G709 Myoneural disorder, unspecified: Secondary | ICD-10-CM | POA: Diagnosis not present

## 2024-07-17 DIAGNOSIS — Z87891 Personal history of nicotine dependence: Secondary | ICD-10-CM | POA: Diagnosis not present

## 2024-07-17 DIAGNOSIS — K219 Gastro-esophageal reflux disease without esophagitis: Secondary | ICD-10-CM | POA: Insufficient documentation

## 2024-07-17 DIAGNOSIS — I10 Essential (primary) hypertension: Secondary | ICD-10-CM | POA: Diagnosis not present

## 2024-07-17 HISTORY — PX: XI ROBOTIC ASSISTED INGUINAL HERNIA REPAIR WITH MESH: SHX6706

## 2024-07-17 SURGERY — REPAIR, HERNIA, INGUINAL, ROBOT-ASSISTED, LAPAROSCOPIC, USING MESH
Anesthesia: General | Site: Groin | Laterality: Left

## 2024-07-17 MED ORDER — PROPOFOL 500 MG/50ML IV EMUL
INTRAVENOUS | Status: AC
  Filled 2024-07-17: qty 50

## 2024-07-17 MED ORDER — OXYCODONE HCL 5 MG/5ML PO SOLN: 5.0000 mg | Freq: Once | ORAL | Status: DC | PRN

## 2024-07-17 MED ORDER — PROPOFOL 10 MG/ML IV BOLUS
INTRAVENOUS | Status: AC
  Filled 2024-07-17: qty 20

## 2024-07-17 MED ORDER — SUGAMMADEX SODIUM 500 MG/5ML IV SOLN
INTRAVENOUS | Status: DC | PRN
Start: 2024-07-17 — End: 2024-07-17
  Administered 2024-07-17 (×5): 50 mg via INTRAVENOUS

## 2024-07-17 MED ORDER — OXYCODONE HCL 5 MG PO TABS
5.0000 mg | ORAL_TABLET | Freq: Four times a day (QID) | ORAL | 0 refills | Status: DC | PRN
Start: 2024-07-17 — End: 2024-08-01

## 2024-07-17 MED ORDER — ACETAMINOPHEN 10 MG/ML IV SOLN
INTRAVENOUS | Status: AC
  Filled 2024-07-17: qty 100

## 2024-07-17 MED ORDER — CEFAZOLIN SODIUM-DEXTROSE 2-3 GM-%(50ML) IV SOLR
INTRAVENOUS | Status: DC | PRN
Start: 2024-07-17 — End: 2024-07-17
  Administered 2024-07-17: 2 g via INTRAVENOUS

## 2024-07-17 MED ORDER — FENTANYL CITRATE (PF) 100 MCG/2ML IJ SOLN
INTRAMUSCULAR | Status: AC
Start: 2024-07-17 — End: 2024-07-17
  Filled 2024-07-17: qty 2

## 2024-07-17 MED ORDER — CEFAZOLIN SODIUM-DEXTROSE 2-4 GM/100ML-% IV SOLN
INTRAVENOUS | Status: AC
  Filled 2024-07-17: qty 100

## 2024-07-17 MED ORDER — DEXAMETHASONE SODIUM PHOSPHATE 10 MG/ML IJ SOLN
INTRAMUSCULAR | Status: DC | PRN
Start: 2024-07-17 — End: 2024-07-17
  Administered 2024-07-17: 10 mg via INTRAVENOUS

## 2024-07-17 MED ORDER — DEXMEDETOMIDINE HCL IN NACL 80 MCG/20ML IV SOLN
INTRAVENOUS | Status: DC | PRN
Start: 2024-07-17 — End: 2024-07-17
  Administered 2024-07-17 (×2): 4 ug via INTRAVENOUS

## 2024-07-17 MED ORDER — GLYCOPYRROLATE PF 0.2 MG/ML IJ SOSY
PREFILLED_SYRINGE | INTRAMUSCULAR | Status: DC | PRN
  Administered 2024-07-17: .2 mg via INTRAVENOUS

## 2024-07-17 MED ORDER — ORAL CARE MOUTH RINSE: 15.0000 mL | Freq: Once | OROMUCOSAL | Status: DC

## 2024-07-17 MED ORDER — PROPOFOL 10 MG/ML IV BOLUS
INTRAVENOUS | Status: DC | PRN
Start: 2024-07-17 — End: 2024-07-17
  Administered 2024-07-17: 150 ug/kg/min via INTRAVENOUS
  Administered 2024-07-17: 150 mg via INTRAVENOUS
  Administered 2024-07-17 (×2): 120 ug/kg/min via INTRAVENOUS
  Administered 2024-07-17: 50 mg via INTRAVENOUS

## 2024-07-17 MED ORDER — STERILE WATER FOR IRRIGATION IR SOLN
Status: DC | PRN
  Administered 2024-07-17: 500 mL

## 2024-07-17 MED ORDER — PROPOFOL 500 MG/50ML IV EMUL
INTRAVENOUS | Status: AC
  Filled 2024-07-17: qty 100

## 2024-07-17 MED ORDER — KETOROLAC TROMETHAMINE 30 MG/ML IJ SOLN
30.0000 mg | Freq: Once | INTRAMUSCULAR | Status: AC
  Administered 2024-07-17: 30 mg via INTRAVENOUS
  Filled 2024-07-17: qty 1

## 2024-07-17 MED ORDER — BUPIVACAINE HCL (PF) 0.5 % IJ SOLN
INTRAMUSCULAR | Status: AC
  Filled 2024-07-17: qty 30

## 2024-07-17 MED ORDER — BUPIVACAINE HCL (PF) 0.5 % IJ SOLN
INTRAMUSCULAR | Status: DC | PRN
Start: 2024-07-17 — End: 2024-07-17
  Administered 2024-07-17: 30 mL

## 2024-07-17 MED ORDER — FENTANYL CITRATE (PF) 100 MCG/2ML IJ SOLN
INTRAMUSCULAR | Status: DC | PRN
  Administered 2024-07-17 (×2): 50 ug via INTRAVENOUS

## 2024-07-17 MED ORDER — CEFAZOLIN SODIUM-DEXTROSE 2-4 GM/100ML-% IV SOLN: 2.0000 g | INTRAVENOUS | Status: DC

## 2024-07-17 MED ORDER — ONDANSETRON HCL 4 MG/2ML IJ SOLN: 4.0000 mg | Freq: Once | INTRAMUSCULAR | Status: DC | PRN

## 2024-07-17 MED ORDER — LACTATED RINGERS IV SOLN: INTRAVENOUS | Status: DC | PRN

## 2024-07-17 MED ORDER — LACTATED RINGERS IV SOLN: INTRAVENOUS | Status: DC

## 2024-07-17 MED ORDER — CHLORHEXIDINE GLUCONATE CLOTH 2 % EX PADS: 6.0000 | MEDICATED_PAD | Freq: Once | CUTANEOUS | Status: DC

## 2024-07-17 MED ORDER — ACETAMINOPHEN 10 MG/ML IV SOLN
INTRAVENOUS | Status: DC | PRN
  Administered 2024-07-17: 1000 mg via INTRAVENOUS

## 2024-07-17 MED ORDER — ONDANSETRON HCL 4 MG/2ML IJ SOLN
INTRAMUSCULAR | Status: DC | PRN
  Administered 2024-07-17: 4 mg via INTRAVENOUS

## 2024-07-17 MED ORDER — CHLORHEXIDINE GLUCONATE 0.12 % MT SOLN: 15.0000 mL | Freq: Once | OROMUCOSAL | Status: DC

## 2024-07-17 MED ORDER — FENTANYL CITRATE PF 50 MCG/ML IJ SOSY: 25.0000 ug | PREFILLED_SYRINGE | INTRAMUSCULAR | Status: DC | PRN

## 2024-07-17 MED ORDER — SUGAMMADEX SODIUM 200 MG/2ML IV SOLN
INTRAVENOUS | Status: AC
  Filled 2024-07-17: qty 2

## 2024-07-17 MED ORDER — OXYCODONE HCL 5 MG PO TABS: 5.0000 mg | ORAL_TABLET | Freq: Once | ORAL | Status: DC | PRN

## 2024-07-17 MED ORDER — LIDOCAINE 2% (20 MG/ML) 5 ML SYRINGE
INTRAMUSCULAR | Status: DC | PRN
  Administered 2024-07-17: 100 mg via INTRAVENOUS

## 2024-07-17 MED ORDER — ROCURONIUM BROMIDE 10 MG/ML (PF) SYRINGE
PREFILLED_SYRINGE | INTRAVENOUS | Status: DC | PRN
  Administered 2024-07-17: 10 mg via INTRAVENOUS
  Administered 2024-07-17: 80 mg via INTRAVENOUS
  Administered 2024-07-17: 10 mg via INTRAVENOUS

## 2024-07-17 SURGICAL SUPPLY — 39 items
CHLORAPREP W/TINT 26 (MISCELLANEOUS) ×1 IMPLANT
COVER LIGHT HANDLE (MISCELLANEOUS) IMPLANT
COVER MAYO STAND XLG (MISCELLANEOUS) ×1 IMPLANT
COVER TIP SHEARS 8 DVNC (MISCELLANEOUS) ×1 IMPLANT
DERMABOND ADVANCED .7 DNX12 (GAUZE/BANDAGES/DRESSINGS) ×1 IMPLANT
DRAPE ARM DVNC X/XI (DISPOSABLE) ×3 IMPLANT
DRAPE COLUMN DVNC XI (DISPOSABLE) ×1 IMPLANT
DRIVER NDL MEGA SUTCUT DVNCXI (INSTRUMENTS) ×1 IMPLANT
DRIVER NDLE MEGA SUTCUT DVNCXI (INSTRUMENTS) ×1 IMPLANT
ELECTRODE REM PT RTRN 9FT ADLT (ELECTROSURGICAL) ×1 IMPLANT
FORCEPS BPLR R/ABLATION 8 DVNC (INSTRUMENTS) ×1 IMPLANT
GAUZE SPONGE 4X4 12PLY STRL (GAUZE/BANDAGES/DRESSINGS) ×1 IMPLANT
GLOVE BIOGEL PI IND STRL 7.0 (GLOVE) ×4 IMPLANT
GLOVE SURG SS PI 7.0 STRL IVOR (GLOVE) IMPLANT
GLOVE SURG SS PI 7.5 STRL IVOR (GLOVE) ×2 IMPLANT
GOWN STRL REUS W/TWL LRG LVL3 (GOWN DISPOSABLE) ×2 IMPLANT
KIT PINK PAD W/HEAD ARM REST (MISCELLANEOUS) ×1 IMPLANT
KIT TURNOVER KIT A (KITS) ×1 IMPLANT
MANIFOLD NEPTUNE II (INSTRUMENTS) ×1 IMPLANT
MESH 3DMAX MID 5X7 LT XLRG (Mesh General) IMPLANT
NDL HYPO 21X1.5 SAFETY (NEEDLE) ×1 IMPLANT
NDL INSUFFLATION 14GA 120MM (NEEDLE) ×1 IMPLANT
NEEDLE HYPO 21X1.5 SAFETY (NEEDLE) ×1 IMPLANT
NEEDLE INSUFFLATION 14GA 120MM (NEEDLE) ×1 IMPLANT
OBTURATOR OPTICALSTD 8 DVNC (TROCAR) ×1 IMPLANT
PACK LAP CHOLE LZT030E (CUSTOM PROCEDURE TRAY) ×1 IMPLANT
PENCIL HANDSWITCHING (ELECTRODE) ×1 IMPLANT
SCISSORS MNPLR CVD DVNC XI (INSTRUMENTS) ×1 IMPLANT
SEAL UNIV 5-12 XI (MISCELLANEOUS) ×3 IMPLANT
SET BASIN LINEN APH (SET/KITS/TRAYS/PACK) ×1 IMPLANT
SET TUBE SMOKE EVAC HIGH FLOW (TUBING) ×1 IMPLANT
SOL PREP POV-IOD 4OZ 10% (MISCELLANEOUS) ×1 IMPLANT
SUT MNCRL AB 4-0 PS2 18 (SUTURE) ×2 IMPLANT
SUT STRATA 3-0 SH (SUTURE) ×4 IMPLANT
SUT VIC AB 2-0 SH 27X BRD (SUTURE) ×1 IMPLANT
SYR 30ML LL (SYRINGE) ×1 IMPLANT
TAPE TRANSPORE STRL 2 31045 (GAUZE/BANDAGES/DRESSINGS) ×1 IMPLANT
TRAY FOL W/BAG SLVR 16FR STRL (SET/KITS/TRAYS/PACK) ×1 IMPLANT
WATER STERILE IRR 500ML POUR (IV SOLUTION) ×1 IMPLANT

## 2024-07-17 NOTE — Interval H&P Note (Signed)
 History and Physical Interval Note:  07/17/2024 8:31 AM  Marcus Jensen  has presented today for surgery, with the diagnosis of INGUINAL HERNIA, LEFT.  The various methods of treatment have been discussed with the patient and family. After consideration of risks, benefits and other options for treatment, the patient has consented to  Procedure(s) with comments: REPAIR, HERNIA, INGUINAL, ROBOT-ASSISTED, LAPAROSCOPIC, USING MESH (Left) - W/ MESH, pt knows to arrive at 8:10 as a surgical intervention.  The patient's history has been reviewed, patient examined, no change in status, stable for surgery.  I have reviewed the patient's chart and labs.  Questions were answered to the patient's satisfaction.     Oneil Budge

## 2024-07-17 NOTE — Anesthesia Preprocedure Evaluation (Signed)
 Anesthesia Evaluation  Patient identified by MRN, date of birth, ID band Patient awake    Reviewed: Allergy & Precautions, H&P , NPO status , Patient's Chart, lab work & pertinent test results, reviewed documented beta blocker date and time   History of Anesthesia Complications (+) PONV and history of anesthetic complications  Airway Mallampati: II  TM Distance: >3 FB Neck ROM: full    Dental no notable dental hx.    Pulmonary neg pulmonary ROS, former smoker   Pulmonary exam normal breath sounds clear to auscultation       Cardiovascular Exercise Tolerance: Good hypertension,  Rhythm:regular Rate:Normal     Neuro/Psych  Neuromuscular disease  negative psych ROS   GI/Hepatic Neg liver ROS,GERD  ,,  Endo/Other  negative endocrine ROS    Renal/GU negative Renal ROS  negative genitourinary   Musculoskeletal   Abdominal   Peds  Hematology negative hematology ROS (+)   Anesthesia Other Findings   Reproductive/Obstetrics negative OB ROS                              Anesthesia Physical Anesthesia Plan  ASA: 2  Anesthesia Plan: General and General ETT   Post-op Pain Management:    Induction:   PONV Risk Score and Plan: Ondansetron   Airway Management Planned:   Additional Equipment:   Intra-op Plan:   Post-operative Plan:   Informed Consent: I have reviewed the patients History and Physical, chart, labs and discussed the procedure including the risks, benefits and alternatives for the proposed anesthesia with the patient or authorized representative who has indicated his/her understanding and acceptance.     Dental Advisory Given  Plan Discussed with: CRNA  Anesthesia Plan Comments:         Anesthesia Quick Evaluation

## 2024-07-17 NOTE — Anesthesia Procedure Notes (Signed)
 Procedure Name: Intubation Date/Time: 07/17/2024 11:09 AM  Performed by: Antonetta Ronnald Caldron, CRNAPre-anesthesia Checklist: Patient identified, Emergency Drugs available, Suction available and Patient being monitored Patient Re-evaluated:Patient Re-evaluated prior to induction Oxygen Delivery Method: Circle system utilized Preoxygenation: Pre-oxygenation with 100% oxygen Induction Type: IV induction Ventilation: Mask ventilation without difficulty and Oral airway inserted - appropriate to patient size Laryngoscope Size: Mac and 4 Grade View: Grade II Tube type: Oral (large epiglottis) Tube size: 7.0 mm Number of attempts: 1 Placement Confirmation: ETT inserted through vocal cords under direct vision, positive ETCO2 and breath sounds checked- equal and bilateral Secured at: 23 cm Tube secured with: Tape Dental Injury: Teeth and Oropharynx as per pre-operative assessment

## 2024-07-17 NOTE — Op Note (Signed)
 Patient:  Marcus Jensen  DOB:  06-13-1951  MRN:  [id number]   Preop Diagnosis: Left inguinal hernia  Postop Diagnosis: Same  Procedure: Robotic assisted laparoscopic left inguinal herniorrhaphy with mesh  Surgeon: Oneil Budge, MD  Anes: General endotracheal  Indications: Patient is a 73 year old white male who presents with a symptomatic left inguinal hernia.  The risks and benefits of the procedure including bleeding, infection, mesh use, and the possibility of recurrence of the hernia were fully explained to the patient, who gave informed consent.  Procedure note: The patient was placed in the supine position.  After induction of general endotracheal anesthesia, the perineum and abdomen were prepped and draped using the usual sterile technique with Betadine and ChloraPrep.  Surgical site confirmation was performed.  An incision was made in the left upper quadrant at Palmer's point.  A Veress needle was introduced into the abdominal cavity and confirmation of placement was done using the saline drop test.  The abdomen was insufflated to 15 mmHg pressure.  An 8 mm trocar was introduced into the abdominal cavity under direct visualization without difficulty.  The patient was placed in deeper Trendelenburg position and additional 8 mm trocars were placed in the upper midline and right upper quadrant regions.  The robot was then docked and targeted.  The patient did have an adhesion of the sigmoid colon up at the indirect hernia sac.  A peritoneal flap was then performed and extended down to Cooper's ligament.  I was able to dissect past the midline point approximately 2 cm and deep to Cooper's ligament by 3 cm.  This was taken out laterally.  The hernia sac was freed away from the spermatic cord.  A 7 cm posterior dissection was performed from the hernia defect.  An extra-large Bard 3D max mesh was then inserted and secured to Cooper's ligament using a 2-0 Vicryl interrupted suture.  An  additional tacking suture was placed laterally and anteriorly above the hernia defect.  The peritoneal flap was then closed using a 3-0 Stratafix running suture.  A small peritoneal defect was closed using a 3-0 Stratafix running suture.  The robot was undocked and all air was evacuated from the abdominal cavity prior to the removal of the trocars.  All wounds were irrigated with normal saline.  All wounds were injected with 0.5% Sensorcaine .  The left ileal inguinal region was impregnated with 0.5% Sensorcaine .  All incisions were closed using a 4-0 Monocryl subcuticular suture.  Dermabond was applied.  All tape and needle counts were correct at the end of the procedure.  The patient was extubated in the operating room and transferred to PACU in stable condition.   Complications: None  EBL: Minimal  Specimen: None

## 2024-07-17 NOTE — Transfer of Care (Signed)
 Immediate Anesthesia Transfer of Care Note  Patient: Marcus Jensen  Procedure(s) Performed: REPAIR, HERNIA, INGUINAL, ROBOT-ASSISTED, LAPAROSCOPIC, USING MESH (Left: Groin)  Patient Location: PACU  Anesthesia Type:General  Level of Consciousness: drowsy, patient cooperative, and responds to stimulation  Airway & Oxygen Therapy: Patient Spontanous Breathing and Patient connected to nasal cannula oxygen  Post-op Assessment: Report given to RN and Post -op Vital signs reviewed and stable  Post vital signs: Reviewed and stable  Last Vitals:  Vitals Value Taken Time  BP 99/62 07/17/24 12:50  Temp    Pulse 48 07/17/24 12:53  Resp 15 07/17/24 12:53  SpO2 97 % 07/17/24 12:53  Vitals shown include unfiled device data.  Last Pain:  Vitals:   07/17/24 0840  PainSc: 0-No pain         Complications: No notable events documented.

## 2024-07-18 ENCOUNTER — Encounter (HOSPITAL_COMMUNITY): Payer: Self-pay | Admitting: General Surgery

## 2024-07-18 NOTE — Anesthesia Postprocedure Evaluation (Signed)
 Anesthesia Post Note  Patient: Marcus Jensen  Procedure(s) Performed: REPAIR, HERNIA, INGUINAL, ROBOT-ASSISTED, LAPAROSCOPIC, USING MESH (Left: Groin)  Patient location during evaluation: Phase II Anesthesia Type: General Level of consciousness: awake Pain management: pain level controlled Vital Signs Assessment: post-procedure vital signs reviewed and stable Respiratory status: spontaneous breathing and respiratory function stable Cardiovascular status: blood pressure returned to baseline and stable Postop Assessment: no headache and no apparent nausea or vomiting Anesthetic complications: no Comments: Late entry   No notable events documented.   Last Vitals:  Vitals:   07/17/24 1345 07/17/24 1430  BP:  116/63  Pulse: (!) 40 (!) 45  Resp: 14 16  Temp: (!) 35.8 C 36.4 C  SpO2: 100% 100%    Last Pain:  Vitals:   07/17/24 1430  PainSc: 0-No pain                 Yvonna JINNY Bosworth

## 2024-08-01 ENCOUNTER — Other Ambulatory Visit: Payer: Self-pay

## 2024-08-01 ENCOUNTER — Encounter: Payer: Self-pay | Admitting: General Surgery

## 2024-08-01 ENCOUNTER — Ambulatory Visit (INDEPENDENT_AMBULATORY_CARE_PROVIDER_SITE_OTHER): Admitting: General Surgery

## 2024-08-01 VITALS — BP 127/76 | HR 56 | Temp 98.3°F | Resp 16 | Ht 73.0 in | Wt 218.6 lb

## 2024-08-01 DIAGNOSIS — Z09 Encounter for follow-up examination after completed treatment for conditions other than malignant neoplasm: Secondary | ICD-10-CM

## 2024-08-01 NOTE — Progress Notes (Signed)
 Subjective:     Marcus Jensen  Patient here for postoperative visit, status post robotic assisted laparoscopic left inguinal herniorrhaphy with mesh.  Patient is doing well.  He did have some scrotal bruising but that is starting to resolve.  He has no significant pain.  He states he probably took 2 pain pills and that was it. Objective:    BP 127/76   Pulse (!) 56   Temp 98.3 F (36.8 C) (Oral)   Resp 16   Ht 6' 1 (1.854 m)   Wt 218 lb 9.6 oz (99.2 kg)   SpO2 94%   BMI 28.84 kg/m   General:  alert, cooperative, and no distress  Abdomen soft, incisions healing well.  No hematoma or seroma present in the left groin region.  Resolving scrotal ecchymosis is noted.     Assessment:    Doing well postoperatively.    Plan:   May gradually resume normal activity.  Follow-up here as needed.

## 2024-08-17 DIAGNOSIS — D0439 Carcinoma in situ of skin of other parts of face: Secondary | ICD-10-CM | POA: Diagnosis not present

## 2024-08-17 DIAGNOSIS — L570 Actinic keratosis: Secondary | ICD-10-CM | POA: Diagnosis not present

## 2024-08-17 DIAGNOSIS — C44519 Basal cell carcinoma of skin of other part of trunk: Secondary | ICD-10-CM | POA: Diagnosis not present

## 2024-09-15 ENCOUNTER — Ambulatory Visit (INDEPENDENT_AMBULATORY_CARE_PROVIDER_SITE_OTHER): Admitting: *Deleted

## 2024-09-15 DIAGNOSIS — Z09 Encounter for follow-up examination after completed treatment for conditions other than malignant neoplasm: Secondary | ICD-10-CM

## 2024-09-15 DIAGNOSIS — Z23 Encounter for immunization: Secondary | ICD-10-CM

## 2024-09-15 NOTE — Progress Notes (Signed)
 Patient is in office today for a nurse visit for Immunization Shingles vaccine Patient Injection was given in the  Right deltoid. Patient tolerated injection well.

## 2024-09-19 ENCOUNTER — Ambulatory Visit

## 2024-10-02 DIAGNOSIS — M1712 Unilateral primary osteoarthritis, left knee: Secondary | ICD-10-CM | POA: Diagnosis not present

## 2024-10-25 ENCOUNTER — Encounter: Payer: Self-pay | Admitting: Cardiology

## 2024-10-25 MED ORDER — METOPROLOL SUCCINATE ER 25 MG PO TB24: 25.0000 mg | ORAL_TABLET | ORAL | 1 refills | Status: AC

## 2025-05-22 ENCOUNTER — Encounter: Payer: Self-pay | Admitting: Family Medicine
